# Patient Record
Sex: Female | Born: 1995 | Race: White | Hispanic: No | Marital: Single | Smoking: Never smoker
Health system: Southern US, Community
[De-identification: ages and names within clinical notes are randomized; demographics above are authoritative.]

## PROBLEM LIST (undated history)

## (undated) ENCOUNTER — Inpatient Hospital Stay: Payer: Self-pay

## (undated) DIAGNOSIS — F411 Generalized anxiety disorder: Secondary | ICD-10-CM

## (undated) DIAGNOSIS — I10 Essential (primary) hypertension: Secondary | ICD-10-CM

## (undated) DIAGNOSIS — E119 Type 2 diabetes mellitus without complications: Secondary | ICD-10-CM

## (undated) DIAGNOSIS — E1110 Type 2 diabetes mellitus with ketoacidosis without coma: Secondary | ICD-10-CM

## (undated) DIAGNOSIS — R011 Cardiac murmur, unspecified: Secondary | ICD-10-CM

## (undated) HISTORY — DX: Essential (primary) hypertension: I10

## (undated) HISTORY — DX: Generalized anxiety disorder: F41.1

## (undated) HISTORY — DX: Type 2 diabetes mellitus with ketoacidosis without coma: E11.10

## (undated) HISTORY — DX: Cardiac murmur, unspecified: R01.1

## (undated) HISTORY — PX: NO PAST SURGERIES: SHX2092

---

## 2013-08-25 ENCOUNTER — Inpatient Hospital Stay: Payer: Self-pay | Admitting: Internal Medicine

## 2013-08-25 LAB — BASIC METABOLIC PANEL
Anion Gap: 11 (ref 7–16)
BUN: 7 mg/dL (ref 7–18)
Calcium, Total: 7.7 mg/dL — ABNORMAL LOW (ref 9.0–10.7)
Chloride: 100 mmol/L (ref 98–107)
Chloride: 115 mmol/L — ABNORMAL HIGH (ref 98–107)
Co2: 8 mmol/L — CL (ref 21–32)
Co2: 14 mmol/L — ABNORMAL LOW (ref 21–32)
Creatinine: 0.69 mg/dL (ref 0.60–1.30)
EGFR (Non-African Amer.): >60
Glucose: 556 mg/dL (ref 65–99)
Glucose: 163 mg/dL — ABNORMAL HIGH (ref 65–99)
Osmolality: 283 (ref 275–301)
Osmolality: 281 (ref 275–301)
Potassium: 3 mmol/L — ABNORMAL LOW (ref 3.5–5.1)
Sodium: 128 mmol/L — ABNORMAL LOW (ref 136–145)
Sodium: 140 mmol/L (ref 136–145)

## 2013-08-25 LAB — URINALYSIS, COMPLETE
Glucose,UR: >=500 mg/dL (ref 0–75)
Leukocyte Esterase: NEGATIVE
Nitrite: NEGATIVE
Specific Gravity: 1.024 (ref 1.003–1.030)
Squamous Epithelial: 1
WBC UR: 1 /HPF (ref 0–5)

## 2013-08-25 LAB — CBC
HCT: 46.4 % (ref 35.0–47.0)
MCHC: 33.8 g/dL (ref 32.0–36.0)
MCV: 96 fL (ref 80–100)
RBC: 4.85 10*6/uL (ref 3.80–5.20)
RDW: 13.3 % (ref 11.5–14.5)
WBC: 13.7 10*3/uL — ABNORMAL HIGH (ref 3.6–11.0)

## 2013-08-26 LAB — BASIC METABOLIC PANEL
Anion Gap: 9 (ref 7–16)
BUN: 7 mg/dL (ref 7–18)
BUN: 6 mg/dL — ABNORMAL LOW (ref 7–18)
Calcium, Total: 7.8 mg/dL — ABNORMAL LOW (ref 9.0–10.7)
Chloride: 112 mmol/L — ABNORMAL HIGH (ref 98–107)
Co2: 14 mmol/L — ABNORMAL LOW (ref 21–32)
Creatinine: 0.56 mg/dL — ABNORMAL LOW (ref 0.60–1.30)
EGFR (African American): >60
Glucose: 116 mg/dL — ABNORMAL HIGH (ref 65–99)
Glucose: 375 mg/dL — ABNORMAL HIGH (ref 65–99)
Osmolality: 289 (ref 275–301)
Potassium: 2.8 mmol/L — ABNORMAL LOW (ref 3.5–5.1)

## 2013-08-26 LAB — CBC WITH DIFFERENTIAL/PLATELET
Basophil #: 0.1 10*3/uL (ref 0.0–0.1)
Basophil %: 0.8 %
Eosinophil %: 0.6 %
HCT: 37.4 % (ref 35.0–47.0)
Lymphocyte #: 2.8 10*3/uL (ref 1.0–3.6)
Lymphocyte %: 37.3 %
MCH: 32.9 pg (ref 26.0–34.0)
MCHC: 35.5 g/dL (ref 32.0–36.0)
MCV: 93 fL (ref 80–100)
Monocyte %: 8.2 %
Neutrophil #: 4 10*3/uL (ref 1.4–6.5)
RBC: 4.03 10*6/uL (ref 3.80–5.20)
RDW: 13.3 % (ref 11.5–14.5)
WBC: 7.5 10*3/uL (ref 3.6–11.0)

## 2013-08-26 LAB — MAGNESIUM: Magnesium: 1.2 mg/dL — ABNORMAL LOW

## 2013-08-26 LAB — LIPID PANEL: Triglycerides: 600 mg/dL — ABNORMAL HIGH (ref 0–135)

## 2014-03-18 ENCOUNTER — Emergency Department: Payer: Self-pay

## 2014-03-18 LAB — CBC
HCT: 45.7 % (ref 35.0–47.0)
HGB: 15.4 g/dL (ref 12.0–16.0)
MCH: 31 pg (ref 26.0–34.0)
MCHC: 33.8 g/dL (ref 32.0–36.0)
MCV: 92 fL (ref 80–100)
Platelet: 163 10*3/uL (ref 150–440)
RBC: 4.98 10*6/uL (ref 3.80–5.20)
RDW: 12.9 % (ref 11.5–14.5)
WBC: 7 10*3/uL (ref 3.6–11.0)

## 2014-03-18 LAB — URINALYSIS, COMPLETE
BACTERIA: NONE SEEN
BILIRUBIN, UR: NEGATIVE
BLOOD: NEGATIVE
NITRITE: NEGATIVE
Ph: 6 (ref 4.5–8.0)
RBC,UR: 1 /HPF (ref 0–5)
SPECIFIC GRAVITY: 1.019 (ref 1.003–1.030)
Squamous Epithelial: 9
WBC UR: 17 /HPF (ref 0–5)

## 2014-03-18 LAB — BASIC METABOLIC PANEL
Anion Gap: 9 (ref 7–16)
BUN: 7 mg/dL (ref 7–18)
CALCIUM: 8.7 mg/dL — AB (ref 9.0–10.7)
Chloride: 101 mmol/L (ref 98–107)
Co2: 24 mmol/L (ref 21–32)
Creatinine: 0.87 mg/dL (ref 0.60–1.30)
EGFR (African American): >60
EGFR (Non-African Amer.): >60
Glucose: 181 mg/dL — ABNORMAL HIGH (ref 65–99)
Osmolality: 271 (ref 275–301)
Potassium: 3.9 mmol/L (ref 3.5–5.1)
Sodium: 134 mmol/L — ABNORMAL LOW (ref 136–145)

## 2014-03-18 LAB — PREGNANCY, URINE: PREGNANCY TEST, URINE: NEGATIVE m[IU]/mL

## 2014-03-20 LAB — URINE CULTURE

## 2014-03-23 LAB — CULTURE, BLOOD (SINGLE)

## 2014-11-24 IMAGING — CR DG CHEST 1V
1 series · 2 of 2 positions shown · non-contrast
Comparison: none

REASON FOR EXAM: chest pain
COMMENTS:

PROCEDURE:     DXR - DXR CHEST 1 VIEWAP OR PA  - August 25, 2013 [DATE]
RESULT:     The lungs are clear. The cardiac silhouette and visualized bony
skeleton are unremarkable.

[Series 1: ap · 0.17mm/px · 2 of 2 slices shown]
[im 1/2]
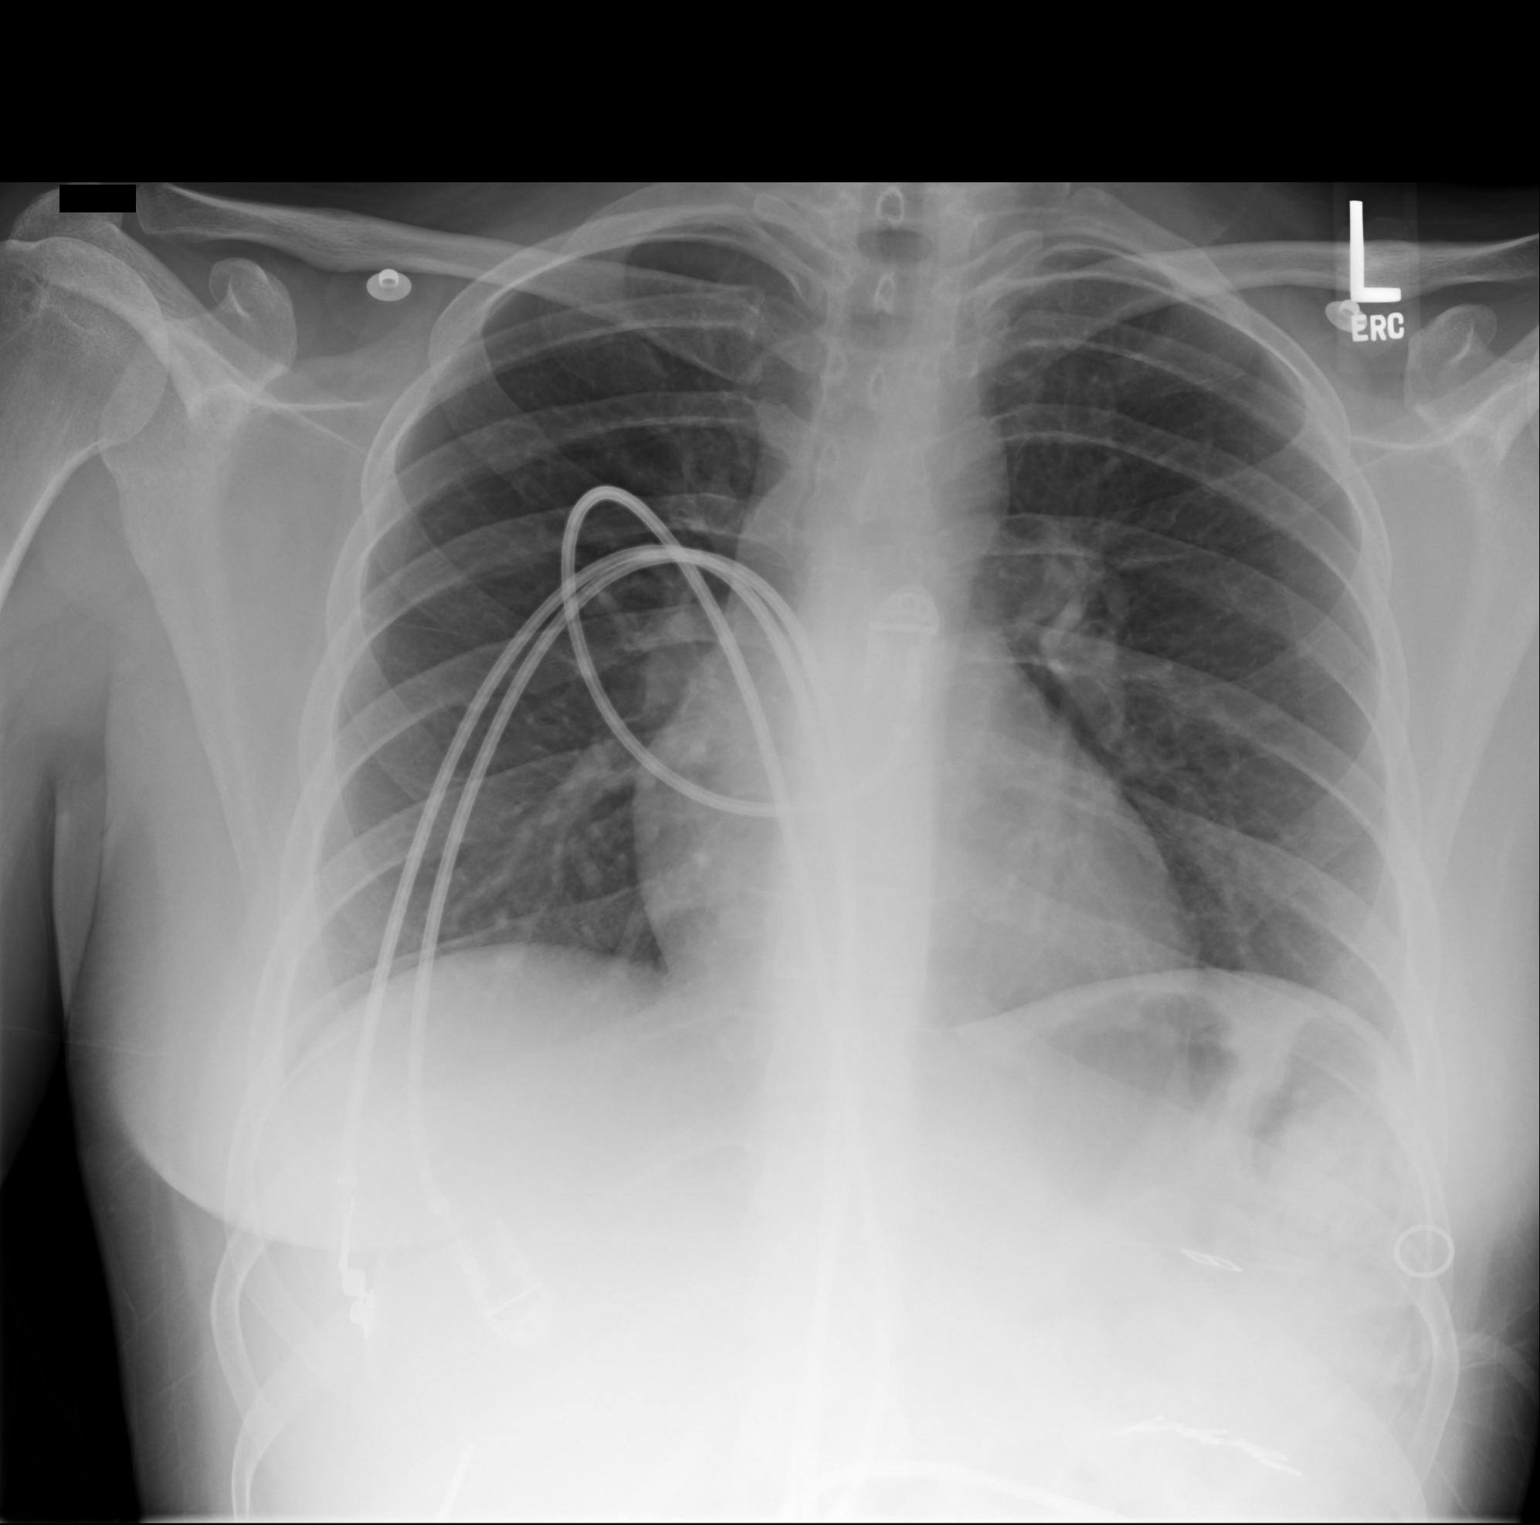
[im 2/2]
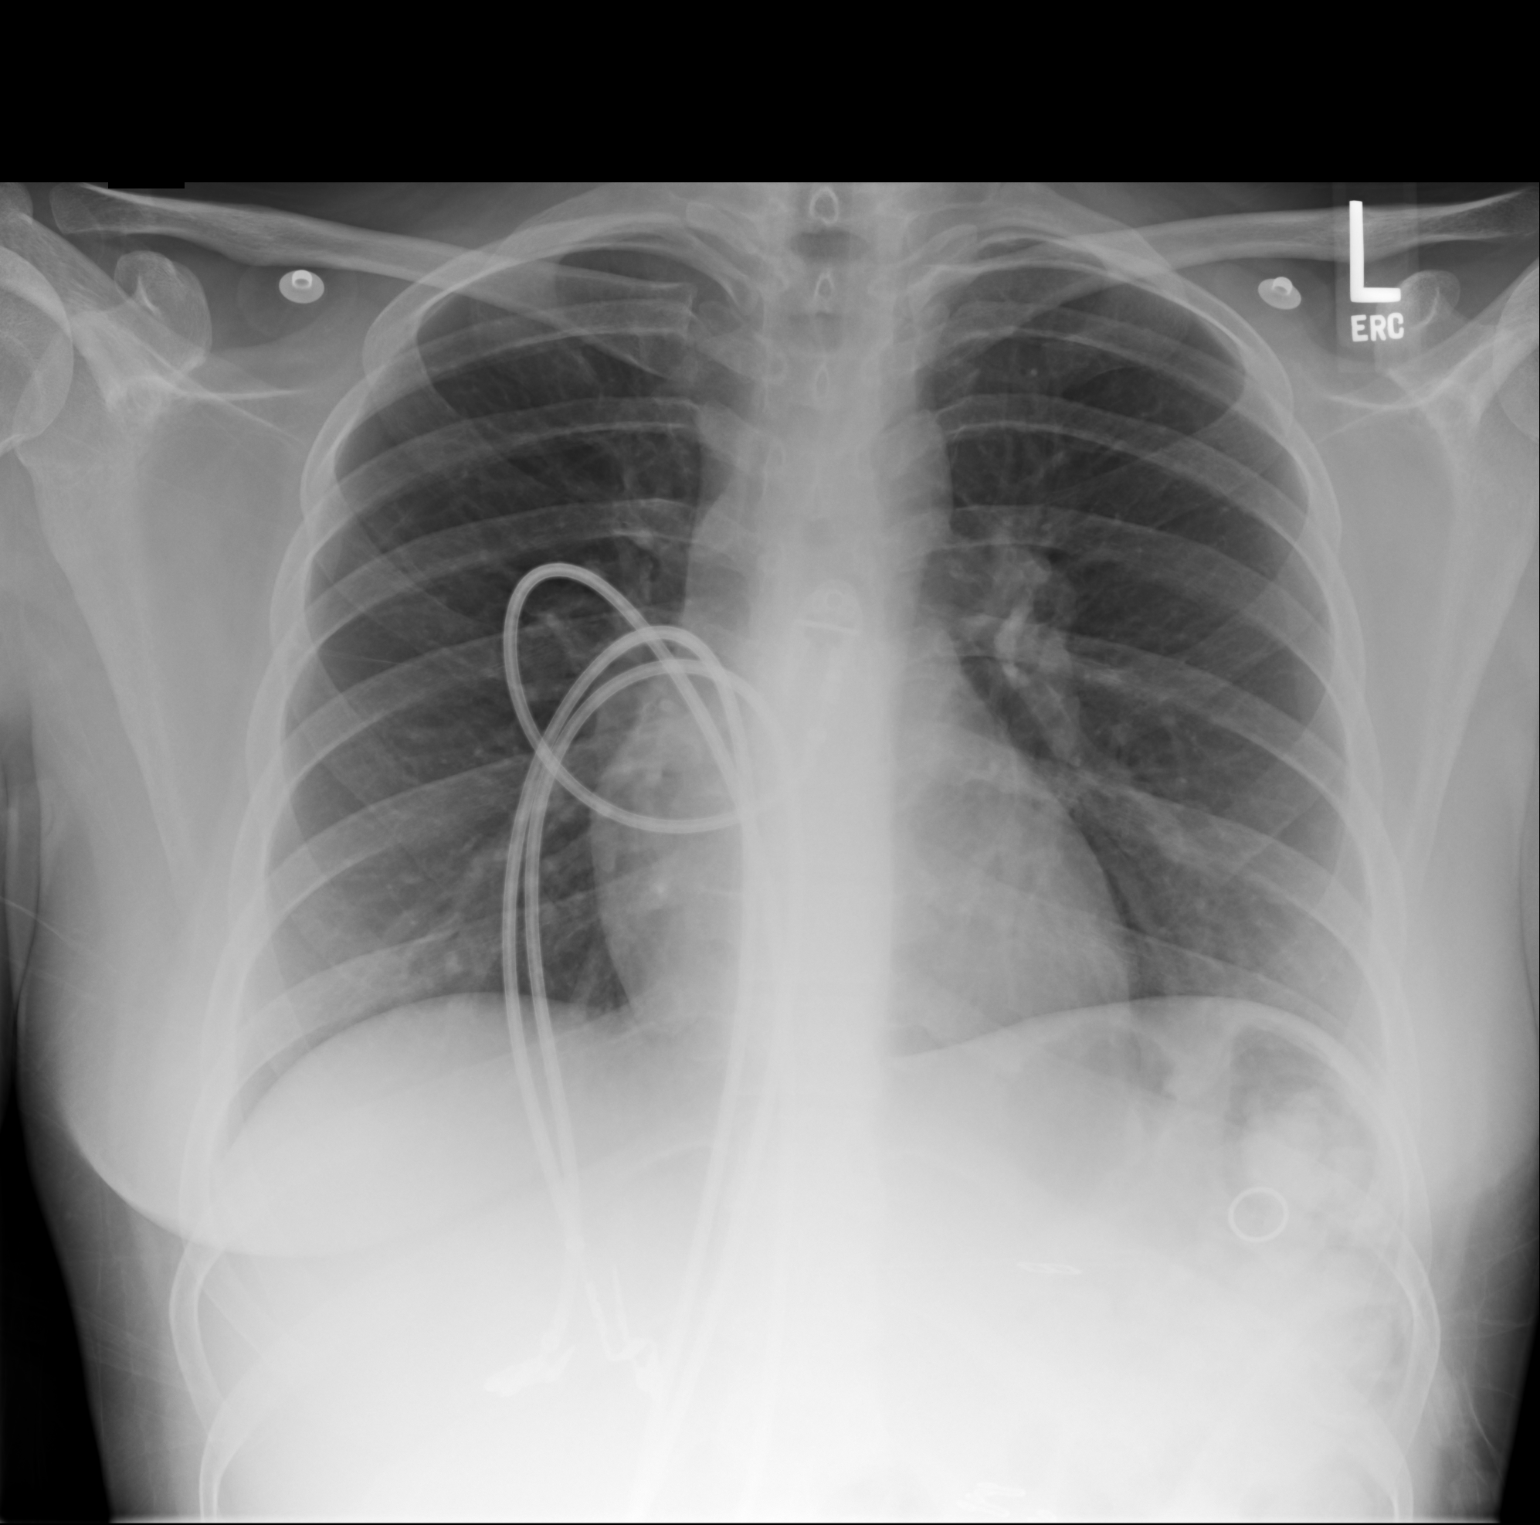

[2 of 2 positions shown; findings below may reference images not displayed]

IMPRESSION: 1. Chest radiograph without evidence of acute cardiopulmonary disease.

## 2015-02-19 NOTE — H&P (Signed)
PATIENT NAMHardin [name]:  [name], [name] MR#:  [id number] DATE OF BIRTH:  04-Oct-1996  DATE OF ADMISSION:  08/25/2013  PRIMARY CARE PHYSICIAN:  None.  REFERRING PHYSICIAN: Maurilio LovelyNoelle McLaurin, MD  CHIEF COMPLAINT: Chest pain on and off for the past 3 months and worsening today.   HISTORY OF PRESENT ILLNESS: A 19 year old Caucasian female with no past medical history presented in the ED with chest pain for the past 3 months which is in the substernal area, burning sensation, intermittent and worsening today. The patient feels sick with nausea, vomiting but she denies any abdominal pain. No diarrhea. No melena or bloody stool. The patient  admitted that she has polyuria, polydipsia and weight loss recently. The patient was noted to have a high blood sugar, more than 500. ABG showed pH 6.98, bicarb 23. The patient is diagnosed with DKA and started insulin drip. The patient denies any fever or chills. No headache or dizziness. No cough, sputum or shortness of breath.  No recent infection.   PAST MEDICAL HISTORY: No.   PAST SURGICAL HISTORY: No.   ALLERGIES: No.   MEDICATION:   No.  FAMILY HISTORY: Father has diabetes and hypertension.   REVIEW OF SYSTEMS: CONSTITUTIONAL: The patient denies any fever or chills. Has headache but no dizziness, but has weakness. Has had weight loss.  EYES: Has double vision, blurry vision. EARS, NOSE, THROAT: No postnasal drip, slurred speech or dysphagia.  CARDIOVASCULAR: Positive for chest pain. No palpitations. No orthopnea or nocturnal dyspnea. No leg edema.  PULMONARY: No cough, sputum, shortness of breath. No hemoptysis.  GASTROINTESTINAL: The patient has some epigastric abdominal pain on and off but not today. The patient has nausea and vomiting once. Denies any diarrhea, melena or bloody stool.  GENITOURINARY: No dysuria, hematuria or incontinence.  SKIN: No rash or jaundice.  NEUROLOGIC: No syncope, loss of consciousness or seizure.  ENDOCRINE: Positive for polyuria,  polydipsia.  No heat or cold intolerance.  SKIN: No rash or jaundice.  HEMATOLOGIC: No easy bruising, bleeding.   PHYSICAL EXAMINATION: VITAL SIGNS: Temperature 98.9, blood pressure 147/70, pulse 116, O2 saturation 99% on room air.  GENERAL: The patient is alert, awake, oriented, in no acute distress.  HEENT: Pupils round, equal and reactive to light and accommodation. Dry oral mucosa. Clear oropharynx. No discharge from ear or nose.   NECK: Supple. No JVD or carotid bruit. No lymphadenopathy. No thyromegaly.  CARDIOVASCULAR: S1, S2. Regular rate, rhythm. No murmurs or gallops.  PULMONARY: Bilateral air entry. No wheezing or rales. No use of accessory muscle to breathe.  ABDOMEN: Soft. No distention or tenderness. No organomegaly. Bowel sounds present.  EXTREMITIES: No edema, clubbing or cyanosis. No calf tenderness. Strong bilateral pedal pulses.  SKIN: No rash or jaundice.  NEUROLOGIC: A and O x 3. No focal deficit. Power 5 out of 5. Sensation intact.   LABORATORY DATA: Venous ABG showed pH of 7.98, pCO2 23. Glucose of 556, BUN 13, creatinine 0.77, sodium 128, potassium 3.9, chloride 100, bicarb 8, anion gap 20. WBC 13.7, hemoglobin 15.7, platelets 290.   IMPRESSION: 1.  Diabetic ketoacidosis.  2.  Uncontrolled diabetes, new diagnosis.  3.  Leukocytosis, possibly due to reaction.  4.  Hyponatremia, possibly due to hyperkalemia.  5.  Possible gastroesophageal reflux disease.   6.  Chest pain, possibly due to gastroesophageal reflux disease.   7.  Tachycardia, possibly due to diabetic ketoacidosis.    PLAN OF TREATMENT: 1.  The patient will be admitted to Critical Care Unit. The  patient is now is on insulin drip. We will continue insulin drip, titrate depending on the patient's blood sugar. We will give aggressive normal saline IV fluid support and get endocrinology consult from Dr. Tedd [name].  2.  We will follow up CBC, BMP and hemoglobin A1c.   3.  For possible GERD, we will give Protonix  and check a chest x-ray.   I discussed the patient's critical condition with the patient and the patient's father.   TIME SPENT: About 65 minutes.    ____________________________ Selena Pollack, MD qc:cs D: 08/25/2013 19:35:38 ET T: 08/25/2013 20:03:36 ET JOB#: [id number]  cc: Selena Pollack, MD, <Dictator> Selena Pollack MD ELECTRONICALLY SIGNED 08/27/2013 18:20

## 2015-02-19 NOTE — Consult Note (Signed)
PATIENT NAME:  [name], [name] MR#:  [id number] DATE OF BIRTH:  Apr 22, 1996  DATE OF CONSULTATION:  08/26/2013  REFERRING PHYSICIAN:  Shaune Pollack, MD CONSULTING PHYSICIAN:  A. Wendall Mola, MD  CHIEF COMPLAINT: DKA and new onset diabetes.   HISTORY OF PRESENT ILLNESS: This is a 19 year old female seen in consultation for diabetic ketoacidosis. She presented yesterday to the Emergency Department with nausea, vomiting, polyuria, polydipsia and weight loss. She estimates she had lost about 25 pounds in the last 3 to 4 weeks. She was found to have an initial blood sugar of 556, bicarb 8, anion gap 20  and 2+ urinary ketones consistent with ketoacidosis. She has no prior medical history. She denies recent infections or use of medications. She denies exposure to glucocorticoids. She was admitted and placed on IV fluids and IV insulin. Her blood sugars improved. She was able to be taken off the IV insulin once her anion gap closed. She was then put on once daily Levemir and a NovoLog insulin sliding scale. Sugars over the last couple of hours have been in the range of 104 to 359. She was not given any prandial insulin at breakfast or sliding scale, as her premeal sugar was just 120. She states she ate her full tray, including some orange juice on the tray.   PAST MEDICAL HISTORY: None.   OUTPATIENT MEDICATIONS: None.   SOCIAL HISTORY: The patient is employed. She denies use of tobacco or alcohol.   FAMILY HISTORY: Father has type 2 diabetes, onset age 53 and is managed on oral diabetes medications. Mother had a history of gestational diabetes. No known thyroid disease or autoimmune conditions.   ALLERGIES: No known drug allergies.   REVIEW OF SYSTEMS:  GENERAL: Weight loss, as per HPI. She has fatigue.  HEENT: She had blurred vision yesterday, now resolved. Denies sore throat.  NECK: Denies neck pain or dysphagia.  CARDIAC: Denies chest pain or palpitation.  PULMONARY: Denies cough or shortness of  breath.  ABDOMEN: Appetite is good. Nausea and vomiting have resolved.  EXTREMITIES: Denies leg swelling.  SKIN: Denies rash or recent skin changes.  ENDOCRINE:  Denies heat or cold intolerance.  GENITOURINARY: Denies dysuria. Denies hematuria.  HEMATOLOGIC: Denies easy bruisability or recent bleeding.   PHYSICAL EXAMINATION: VITAL SIGNS: Height 66.9 inches, weight 144 pounds, BMI 22. Temp 98.4, pulse 104, respirations 20, blood pressure 133/63, pulse oximetry 99% on room air.  GENERAL: Well-developed, well-nourished young female in no acute distress.  HEENT: EOMI.  Oropharynx is clear. Mucous membranes moist. She has braces on her teeth.  NECK: Supple. No thyromegaly. No neck tenderness.  CARDIAC: Regular rate and rhythm. No murmur.  PULMONARY: Clear to auscultation bilaterally. No wheeze. No rhonchi.  ABDOMEN: Diffusely soft, nontender, nondistended.  EXTREMITIES: No edema is present. Full range of motion in all extremities.  NEUROLOGIC: No focal deficits. No tremor.  SKIN: No rash or dermatopathy is present.  PSYCHIATRIC: Alert and oriented. Calm and cooperative.   LABORATORY DATA: Glucose 116, BUN 7, creatinine 0.55, sodium 141, potassium 2.8, chloride 116, total cholesterol 281, triglycerides 600, magnesium 1.2, HDL 33, TSH 2.50, hematocrit 37.4%, WBC 7.5, hemoglobin 13.3, platelets 227.   ASSESSMENT: A 19 year old female presenting with:  1.  Diabetic ketoacidosis in setting of new onset diabetes, most likely type 1 diabetes.  2.  Hypertriglyceridemia, moderate to severe.  3.  Hypokalemia.   RECOMMENDATIONS: 1.  Agree with transition to subcutaneous insulin. Would increase Levemir to 14 units, this can be given  at bedtime.  2.  Initiate prandial insulin with NovoLog 4 units t.i.d. a.c.  3.  Change sliding scale to 1 unit per 50 over a target of 150.  4.  I recommend a no concentrated sweet diet, in particular, she should not be receiving foods such as juice or ice cream.  5.   She will need diabetes teaching by the diabetic educators, as well as the nutritionist. She will  need to be given a glucometer and taught how to monitor her blood sugars. She will need to be taught on self-administration of insulin.  6.  Recommend she stay in the hospital today. If doing well, then she may be ready for discharge as soon as tomorrow.    7.  I will ensure we get an outpatient followup in the next 1 to 2 weeks.   Thank you for the kind request for consultation. I will follow along with you.    ____________________________ A. Wendall MolaMelissa Lisandro Meggett, MD ams:dmm D: 08/26/2013 13:09:34 ET T: 08/26/2013 13:26:11 ET JOB#: [id number]  cc: A. Wendall MolaMelissa Bohdi Leeds, MD, <Dictator> Macy MisA. MELISSA Arthella Headings MD ELECTRONICALLY SIGNED 09/10/2013 14:02

## 2015-02-19 NOTE — Discharge Summary (Signed)
PATIENT NAME:  [name], [name] DATE OF BIRTH:  1996-01-29  DATE OF ADMISSION:  08/25/2013 DATE OF DISCHARGE:  08/27/2013  PRIMARY CARE PHYSICIAN:  Nonlocal.    DISCHARGE DIAGNOSES:  1.  Acute diabetic ketoacidosis, newly diagnosed diabetes 1.   2.  Hyperkalemia.   3.  Hyperglycemia.    CONDITION: Stable.   CODE STATUS: Full code.   HOME MEDICATIONS: Old medication none. New medication for this patient:  1.  NovoLog insulin aspart 5 units subcutaneous t.i.d. before meals.  2.  Pravastatin 20 mg p.o. at bedtime.  3.  Levemir 16 units subcu at bedtime.   DIET: Low fat, low cholesterol, ADA diet.   ACTIVITY: As tolerated.   FOLLOWUP CARE: Follow with PCP within 1 to 2 weeks. Follow up with Dr. Tedd SiasSolum within 1 to 2 weeks.   CONSULTATION:  Dr. Tedd SiasSolum, endocrinologist.   REASON FOR ADMISSION: Chest pain on and off for the past 3 months and worsening.   HOSPITAL COURSE: The patient is a 19 year old Caucasian female with no past medical history who presented to the ED with chest pain on and off for the past 3 months which is in the substernal area, burning sensation, intermittent and worsening on the admission date. The patient also has nausea, vomiting. The patient was noted to have a high blood sugar, more than 500. ABG showed pH of 6.98, bicarb 23. The patient was diagnosed DKA and started on insulin drip. In addition, the patient has had polyuria, polydipsia, weight loss recently. For detailed history and physical examination, please refer to the admission note dictated by me. Laboratory data on admission date showed ABG as mentioned above. Glucose 556, BUN 13, creatinine 0.77, sodium 128, potassium 3.9, chloride 100, bicarb 8, anion gap 20. WBC 13.7, hemoglobin 15.7.  1.  DKA with new diagnosis of diabetes 1. The patient was initially admitted to CCU with insulin drip and the patient's sugar has been controlled on the insulin drip so the patient was switched to Levemir 16 units  at bedtime with NovoLog 5 units subcu t.i.d. before meals according to Dr. Pricilla HandlerSolum's suggestion. In addition, the patient has been treated with aggressive IV fluid support. The patient's leukocytosis has improved.  2.  The patient also had hypokalemia which was treated with potassium supplement, has improved.  3.  Hypomagnesemia. The patient was treated with magnesium supplement.  4.  The patient triglyceride is high at 600. Cholesterol level is 281. The patient has been treated with pravastatin 20 mg at bedtime.   The patient's symptoms have much improved today. The patient's blood sugar was 128 early in the morning and 306 before lunch. I discussed with Dr. Tedd SiasSolum. She suggested continue Levemir 16 units with 5 units NovoLog before meals. The patient received diabetes education and diet control education. The patient is clinically stable and will be discharged to home today. I discussed the patient's discharge plan with the patient, the patient's mother and father, case manager and Dr. Tedd SiasSolum.   TIME SPENT: About 40 minutes.   ____________________________ Shaune PollackQing Kiyra Slaubaugh, MD qc:cs D: 08/27/2013 17:20:35 ET T: 08/27/2013 19:39:19 ET JOB#: [id number]  cc: Shaune PollackQing Ilisha Blust, MD, <Dictator> Shaune PollackQING Ramesh Moan MD ELECTRONICALLY SIGNED 08/31/2013 12:23

## 2015-06-16 ENCOUNTER — Encounter: Payer: Self-pay | Admitting: Emergency Medicine

## 2015-06-16 ENCOUNTER — Emergency Department
Admission: EM | Admit: 2015-06-16 | Discharge: 2015-06-17 | Disposition: A | Discharge status: Home / Self Care | Payer: BC Managed Care – PPO | Attending: Emergency Medicine | Admitting: Emergency Medicine

## 2015-06-16 DIAGNOSIS — E119 Type 2 diabetes mellitus without complications: Secondary | ICD-10-CM | POA: Insufficient documentation

## 2015-06-16 DIAGNOSIS — N390 Urinary tract infection, site not specified: Secondary | ICD-10-CM

## 2015-06-16 DIAGNOSIS — Z3202 Encounter for pregnancy test, result negative: Secondary | ICD-10-CM | POA: Insufficient documentation

## 2015-06-16 DIAGNOSIS — R350 Frequency of micturition: Secondary | ICD-10-CM | POA: Diagnosis present

## 2015-06-16 HISTORY — DX: Type 2 diabetes mellitus without complications: E11.9

## 2015-06-16 LAB — POCT PREGNANCY, URINE: PREG TEST UR: NEGATIVE

## 2015-06-16 LAB — URINALYSIS COMPLETE WITH MICROSCOPIC (ARMC ONLY)
Bacteria, UA: NONE SEEN
Bilirubin Urine: NEGATIVE
Ketones, ur: NEGATIVE mg/dL
NITRITE: POSITIVE — AB
Protein, ur: 100 mg/dL — AB
SPECIFIC GRAVITY, URINE: 1.016 (ref 1.005–1.030)
pH: 6 (ref 5.0–8.0)

## 2015-06-16 MED ORDER — HYDROCODONE-ACETAMINOPHEN 5-325 MG PO TABS
1.0000 | ORAL_TABLET | Freq: Once | ORAL | Status: AC
  Administered 2015-06-16: 1 via ORAL

## 2015-06-16 MED ORDER — DEXTROSE 5 % IV SOLN
1.0000 g | Freq: Once | INTRAVENOUS | Status: AC
  Administered 2015-06-16: 1 g via INTRAVENOUS
  Filled 2015-06-16: qty 10

## 2015-06-16 MED ORDER — HYDROCODONE-ACETAMINOPHEN 5-325 MG PO TABS: 1.0000 | ORAL_TABLET | ORAL | Status: DC | PRN

## 2015-06-16 MED ORDER — CEPHALEXIN 500 MG PO CAPS: 500.0000 mg | ORAL_CAPSULE | Freq: Three times a day (TID) | ORAL | Status: AC

## 2015-06-16 MED ORDER — HYDROCODONE-ACETAMINOPHEN 5-325 MG PO TABS
ORAL_TABLET | ORAL | Status: AC
  Administered 2015-06-16: 1 via ORAL
  Filled 2015-06-16: qty 1

## 2015-06-16 NOTE — ED Notes (Signed)
C/o burning with urination.  States seen at [facility] Monday and started on amoxicillin for uti, states sx are not improving.

## 2015-06-16 NOTE — Discharge Instructions (Signed)
Please seek medical attention for any high fevers, chest pain, shortness of breath, change in behavior, persistent vomiting, bloody stool or any other new or concerning symptoms. ° °Urinary Tract Infection °Urinary tract infections (UTIs) can develop anywhere along your urinary tract. Your urinary tract is your body's drainage system for removing wastes and extra water. Your urinary tract includes two kidneys, two ureters, a bladder, and a urethra. Your kidneys are a pair of bean-shaped organs. Each kidney is about the size of your fist. They are located below your ribs, one on each side of your spine. °CAUSES °Infections are caused by microbes, which are microscopic organisms, including fungi, viruses, and bacteria. These organisms are so small that they can only be seen through a microscope. Bacteria are the microbes that most commonly cause UTIs. °SYMPTOMS  °Symptoms of UTIs may vary by age and gender of the patient and by the location of the infection. Symptoms in young women typically include a frequent and intense urge to urinate and a painful, burning feeling in the bladder or urethra during urination. Older women and men are more likely to be tired, shaky, and weak and have muscle aches and abdominal pain. A fever may mean the infection is in your kidneys. Other symptoms of a kidney infection include pain in your back or sides below the ribs, nausea, and vomiting. °DIAGNOSIS °To diagnose a UTI, your caregiver will ask you about your symptoms. Your caregiver also will ask to provide a urine sample. The urine sample will be tested for bacteria and white blood cells. White blood cells are made by your body to help fight infection. °TREATMENT  °Typically, UTIs can be treated with medication. Because most UTIs are caused by a bacterial infection, they usually can be treated with the use of antibiotics. The choice of antibiotic and length of treatment depend on your symptoms and the type of bacteria causing your  infection. °HOME CARE INSTRUCTIONS °· If you were prescribed antibiotics, take them exactly as your caregiver instructs you. Finish the medication even if you feel better after you have only taken some of the medication. °· Drink enough water and fluids to keep your urine clear or pale yellow. °· Avoid caffeine, tea, and carbonated beverages. They tend to irritate your bladder. °· Empty your bladder often. Avoid holding urine for long periods of time. °· Empty your bladder before and after sexual intercourse. °· After a bowel movement, women should cleanse from front to back. Use each tissue only once. °SEEK MEDICAL CARE IF:  °· You have back pain. °· You develop a fever. °· Your symptoms do not begin to resolve within 3 days. °SEEK IMMEDIATE MEDICAL CARE IF:  °· You have severe back pain or lower abdominal pain. °· You develop chills. °· You have nausea or vomiting. °· You have continued burning or discomfort with urination. °MAKE SURE YOU:  °· Understand these instructions. °· Will watch your condition. °· Will get help right away if you are not doing well or get worse. °Document Released: 07/26/2005 Document Revised: 04/16/2012 Document Reviewed: 11/24/2011 °Exit[employer]® Patient Information ©2015 [employer]. This information is not intended to replace advice given to you by your health care provider. Make sure you discuss any questions you have with your health care provider. ° °

## 2015-06-16 NOTE — ED Provider Notes (Signed)
[facility] Emergency Department Provider Note    ____________________________________________  Time seen: 2205  I have reviewed the triage vital signs and the nursing notes.   HISTORY  Chief Complaint Urinary Frequency   History limited by: Not Limited   HPI Laasia L Lodico is a 19 y.o. female who presents to the emergency department today with continued concerns for continued urinary tract infection. Patient states that she has been having urinary tract infection symptoms for the past 3 days. This included suprapubic pain and some dysuria. She states that the symptoms have been persistent since they started. She did go see her doctor 3 days ago and was started on amoxicillin. She states that she has had some low-grade fevers. States she does have a history of urinary tract infections.    Past Medical History  Diagnosis Date  . Diabetes mellitus without complication     There are no active problems to display for this patient.   History reviewed. No pertinent past surgical history.  No current outpatient prescriptions on file.  Allergies Review of patient's allergies indicates no known allergies.  History reviewed. No pertinent family history.  Social History Social History  Substance Use Topics  . Smoking status: Never Smoker   . Smokeless tobacco: None  . Alcohol Use: Yes    Review of Systems  Constitutional: Negative for fever. Cardiovascular: Negative for chest pain. Respiratory: Negative for shortness of breath. Gastrointestinal: Positive for suprapubic pain Genitourinary: Negative for dysuria. Musculoskeletal: Negative for back pain. Skin: Negative for rash. Neurological: Negative for headaches, focal weakness or numbness.  10-point ROS otherwise negative.  ____________________________________________   PHYSICAL EXAM:  VITAL SIGNS: ED Triage Vitals  Enc Vitals Group     BP 06/16/15 1819 152/81 mmHg     Pulse Rate  06/16/15 1819 96     Resp 06/16/15 1819 18     Temp 06/16/15 1819 98.3 F (36.8 C)     Temp Source 06/16/15 1819 Oral     SpO2 06/16/15 1819 99 %     Weight 06/16/15 1819 165 lb (74.844 kg)     Height 06/16/15 1819  (1.676 m)     Head Cir --      Peak Flow --      Pain Score 06/16/15 1820 5   Constitutional: Alert and oriented. Well appearing and in no distress. Eyes: Conjunctivae are normal. PERRL. Normal extraocular movements. ENT   Head: Normocephalic and atraumatic.   Nose: No congestion/rhinnorhea.   Mouth/Throat: Mucous membranes are moist.   Neck: No stridor. Hematological/Lymphatic/Immunilogical: No cervical lymphadenopathy. Cardiovascular: Normal rate, regular rhythm.  No murmurs, rubs, or gallops. Respiratory: Normal respiratory effort without tachypnea nor retractions. Breath sounds are clear and equal bilaterally. No wheezes/rales/rhonchi. Gastrointestinal: Soft. Minimally tender to palpation in the suprapubic region. No distention.  Genitourinary: Deferred Musculoskeletal: Normal range of motion in all extremities. No joint effusions.  No lower extremity tenderness nor edema. Neurologic:  Normal speech and language. No gross focal neurologic deficits are appreciated. Speech is normal.  Skin:  Skin is warm, dry and intact. No rash noted. Psychiatric: Mood and affect are normal. Speech and behavior are normal. Patient exhibits appropriate insight and judgment.  ____________________________________________    LABS (pertinent positives/negatives)  Labs Reviewed  URINALYSIS COMPLETEWITH MICROSCOPIC ([facility] ONLY) - Abnormal; Notable for the following:    Color, Urine [place] (*)    APPearance CLOUDY (*)    Glucose, UA >500 (*)    Hgb urine dipstick 2+ (*)  Protein, ur 100 (*)    Nitrite POSITIVE (*)    Leukocytes, UA 2+ (*)    Squamous Epithelial / LPF 0-5 (*)    All other components within normal limits  URINE CULTURE  POC URINE PREG, ED  POCT  PREGNANCY, URINE     ____________________________________________   EKG  None  ____________________________________________    RADIOLOGY  None  ____________________________________________   PROCEDURES  Procedure(s) performed: None  Critical Care performed: No  ____________________________________________   INITIAL IMPRESSION / ASSESSMENT AND PLAN / ED COURSE  Pertinent labs & imaging results that were available during my care of the patient were reviewed by me and considered in my medical decision making (see chart for details).  Patient presents to the emergency department with continued suprapubic pain and concerns for UTI. Urinalysis is consistent with urinary tract infection. Will plan on giving patient does of ceftriaxone here in the emergency department given history of diabetes. No ketones in urine. Will plan on discharging home with ceftriaxone.  ____________________________________________   FINAL CLINICAL IMPRESSION(S) / ED DIAGNOSES  UTI  Phineas Semen, MD 06/16/15 2246

## 2015-06-18 LAB — URINE CULTURE

## 2017-02-25 ENCOUNTER — Encounter: Payer: Self-pay | Admitting: Internal Medicine

## 2017-02-25 ENCOUNTER — Inpatient Hospital Stay
Admission: EM | Admit: 2017-02-25 | Discharge: 2017-02-27 | DRG: 638 | Disposition: A | Discharge status: Home / Self Care | Payer: BLUE CROSS/BLUE SHIELD | Attending: Internal Medicine | Admitting: Internal Medicine

## 2017-02-25 ENCOUNTER — Inpatient Hospital Stay: Payer: BLUE CROSS/BLUE SHIELD

## 2017-02-25 DIAGNOSIS — Z794 Long term (current) use of insulin: Secondary | ICD-10-CM | POA: Diagnosis not present

## 2017-02-25 DIAGNOSIS — N179 Acute kidney failure, unspecified: Secondary | ICD-10-CM | POA: Diagnosis not present

## 2017-02-25 DIAGNOSIS — Z9114 Patient's other noncompliance with medication regimen: Secondary | ICD-10-CM | POA: Diagnosis not present

## 2017-02-25 DIAGNOSIS — R070 Pain in throat: Secondary | ICD-10-CM | POA: Diagnosis present

## 2017-02-25 DIAGNOSIS — Z833 Family history of diabetes mellitus: Secondary | ICD-10-CM | POA: Diagnosis not present

## 2017-02-25 DIAGNOSIS — E860 Dehydration: Secondary | ICD-10-CM | POA: Diagnosis present

## 2017-02-25 DIAGNOSIS — R112 Nausea with vomiting, unspecified: Secondary | ICD-10-CM | POA: Diagnosis present

## 2017-02-25 DIAGNOSIS — E1110 Type 2 diabetes mellitus with ketoacidosis without coma: Secondary | ICD-10-CM

## 2017-02-25 DIAGNOSIS — R918 Other nonspecific abnormal finding of lung field: Secondary | ICD-10-CM

## 2017-02-25 DIAGNOSIS — E1010 Type 1 diabetes mellitus with ketoacidosis without coma: Secondary | ICD-10-CM | POA: Diagnosis not present

## 2017-02-25 DIAGNOSIS — E876 Hypokalemia: Secondary | ICD-10-CM | POA: Diagnosis not present

## 2017-02-25 HISTORY — DX: Type 2 diabetes mellitus with ketoacidosis without coma: E11.10

## 2017-02-25 LAB — BASIC METABOLIC PANEL
ANION GAP: 11 (ref 5–15)
ANION GAP: 6 (ref 5–15)
BUN: 15 mg/dL (ref 6–20)
BUN: 10 mg/dL (ref 6–20)
BUN: 9 mg/dL (ref 6–20)
CHLORIDE: 113 mmol/L — AB (ref 101–111)
CO2: <7 mmol/L — ABNORMAL LOW (ref 22–32)
CO2: 9 mmol/L — ABNORMAL LOW (ref 22–32)
CO2: 14 mmol/L — ABNORMAL LOW (ref 22–32)
Calcium: 6.9 mg/dL — ABNORMAL LOW (ref 8.9–10.3)
Calcium: 7.2 mg/dL — ABNORMAL LOW (ref 8.9–10.3)
Calcium: 7.4 mg/dL — ABNORMAL LOW (ref 8.9–10.3)
Chloride: 109 mmol/L (ref 101–111)
Chloride: 115 mmol/L — ABNORMAL HIGH (ref 101–111)
Creatinine, Ser: 1.14 mg/dL — ABNORMAL HIGH (ref 0.44–1.00)
Creatinine, Ser: 1 mg/dL (ref 0.44–1.00)
Creatinine, Ser: 0.55 mg/dL (ref 0.44–1.00)
GFR calc Af Amer: >60 mL/min (ref >60)
GFR calc Af Amer: >60 mL/min (ref >60)
GFR calc Af Amer: >60 mL/min (ref >60)
GLUCOSE: 333 mg/dL — AB (ref 65–99)
GLUCOSE: 191 mg/dL — AB (ref 65–99)
Glucose, Bld: 112 mg/dL — ABNORMAL HIGH (ref 65–99)
POTASSIUM: 3.9 mmol/L (ref 3.5–5.1)
POTASSIUM: 3.4 mmol/L — AB (ref 3.5–5.1)
Potassium: 5 mmol/L (ref 3.5–5.1)
SODIUM: 133 mmol/L — AB (ref 135–145)
SODIUM: 133 mmol/L — AB (ref 135–145)
SODIUM: 135 mmol/L (ref 135–145)

## 2017-02-25 LAB — COMPREHENSIVE METABOLIC PANEL
ALBUMIN: 5 g/dL (ref 3.5–5.0)
ALT: 24 U/L (ref 14–54)
AST: 38 U/L (ref 15–41)
Alkaline Phosphatase: 112 U/L (ref 38–126)
BUN: 18 mg/dL (ref 6–20)
CHLORIDE: 99 mmol/L — AB (ref 101–111)
CREATININE: 1.71 mg/dL — AB (ref 0.44–1.00)
Calcium: 8.9 mg/dL (ref 8.9–10.3)
GFR calc Af Amer: 48 mL/min — ABNORMAL LOW (ref >60)
GFR, EST NON AFRICAN AMERICAN: 41 mL/min — AB (ref >60)
Glucose, Bld: 655 mg/dL (ref 65–99)
Potassium: 5.8 mmol/L — ABNORMAL HIGH (ref 3.5–5.1)
SODIUM: 130 mmol/L — AB (ref 135–145)
Total Bilirubin: 1.4 mg/dL — ABNORMAL HIGH (ref 0.3–1.2)
Total Protein: 8.7 g/dL — ABNORMAL HIGH (ref 6.5–8.1)

## 2017-02-25 LAB — URINE DRUG SCREEN, QUALITATIVE (ARMC ONLY)
Amphetamines, Ur Screen: NOT DETECTED
BARBITURATES, UR SCREEN: NOT DETECTED
Benzodiazepine, Ur Scrn: NOT DETECTED
CANNABINOID 50 NG, UR ~~LOC~~: NOT DETECTED
Cocaine Metabolite,Ur ~~LOC~~: NOT DETECTED
MDMA (ECSTASY) UR SCREEN: NOT DETECTED
Methadone Scn, Ur: NOT DETECTED
Opiate, Ur Screen: POSITIVE — AB
PHENCYCLIDINE (PCP) UR S: NOT DETECTED
TRICYCLIC, UR SCREEN: NOT DETECTED

## 2017-02-25 LAB — CBC WITH DIFFERENTIAL/PLATELET
Basophils Absolute: 0 10*3/uL (ref 0–0.1)
Basophils Relative: 0 %
EOS ABS: 0 10*3/uL (ref 0–0.7)
Eosinophils Relative: 0 %
HCT: 53.5 % — ABNORMAL HIGH (ref 35.0–47.0)
HEMOGLOBIN: 16.8 g/dL — AB (ref 12.0–16.0)
LYMPHS ABS: 2.4 10*3/uL (ref 1.0–3.6)
LYMPHS PCT: 10 %
MCH: 31.9 pg (ref 26.0–34.0)
MCHC: 31.3 g/dL — AB (ref 32.0–36.0)
MCV: 101.8 fL — AB (ref 80.0–100.0)
MONOS PCT: 4 %
Monocytes Absolute: 0.9 10*3/uL (ref 0.2–0.9)
Neutro Abs: 20.3 10*3/uL — ABNORMAL HIGH (ref 1.4–6.5)
Neutrophils Relative %: 86 %
Platelets: 434 10*3/uL (ref 150–440)
RBC: 5.26 MIL/uL — AB (ref 3.80–5.20)
RDW: 14.6 % — ABNORMAL HIGH (ref 11.5–14.5)
WBC: 23.7 10*3/uL — AB (ref 3.6–11.0)

## 2017-02-25 LAB — PHOSPHORUS
PHOSPHORUS: 1.6 mg/dL — AB (ref 2.5–4.6)
Phosphorus: 1.7 mg/dL — ABNORMAL LOW (ref 2.5–4.6)

## 2017-02-25 LAB — AMYLASE
Amylase: 105 U/L — ABNORMAL HIGH (ref 28–100)
Amylase: 142 U/L — ABNORMAL HIGH (ref 28–100)

## 2017-02-25 LAB — INFLUENZA PANEL BY PCR (TYPE A & B)
INFLAPCR: NEGATIVE
INFLBPCR: NEGATIVE

## 2017-02-25 LAB — GLUCOSE, CAPILLARY
GLUCOSE-CAPILLARY: 101 mg/dL — AB (ref 65–99)
GLUCOSE-CAPILLARY: 147 mg/dL — AB (ref 65–99)
GLUCOSE-CAPILLARY: 183 mg/dL — AB (ref 65–99)
GLUCOSE-CAPILLARY: 183 mg/dL — AB (ref 65–99)
GLUCOSE-CAPILLARY: 197 mg/dL — AB (ref 65–99)
GLUCOSE-CAPILLARY: 234 mg/dL — AB (ref 65–99)
GLUCOSE-CAPILLARY: 263 mg/dL — AB (ref 65–99)
GLUCOSE-CAPILLARY: 426 mg/dL — AB (ref 65–99)
Glucose-Capillary: >600 mg/dL (ref 65–99)
Glucose-Capillary: 523 mg/dL (ref 65–99)
Glucose-Capillary: 296 mg/dL — ABNORMAL HIGH (ref 65–99)
Glucose-Capillary: 190 mg/dL — ABNORMAL HIGH (ref 65–99)
Glucose-Capillary: 195 mg/dL — ABNORMAL HIGH (ref 65–99)
Glucose-Capillary: 142 mg/dL — ABNORMAL HIGH (ref 65–99)
Glucose-Capillary: 109 mg/dL — ABNORMAL HIGH (ref 65–99)
Glucose-Capillary: 97 mg/dL (ref 65–99)

## 2017-02-25 LAB — PROCALCITONIN: Procalcitonin: 0.61 ng/mL

## 2017-02-25 LAB — MRSA PCR SCREENING: MRSA by PCR: NEGATIVE

## 2017-02-25 LAB — MAGNESIUM
Magnesium: 1.5 mg/dL — ABNORMAL LOW (ref 1.7–2.4)
Magnesium: 2.8 mg/dL — ABNORMAL HIGH (ref 1.7–2.4)

## 2017-02-25 LAB — RAPID HIV SCREEN (HIV 1/2 AB+AG)
HIV 1/2 ANTIBODIES: NONREACTIVE
HIV-1 P24 Antigen - HIV24: NONREACTIVE

## 2017-02-25 LAB — LIPASE, BLOOD: LIPASE: 16 U/L (ref 11–51)

## 2017-02-25 LAB — PREGNANCY, URINE: PREG TEST UR: NEGATIVE

## 2017-02-25 MED ORDER — ONDANSETRON HCL 4 MG/2ML IJ SOLN
4.0000 mg | Freq: Four times a day (QID) | INTRAMUSCULAR | Status: DC | PRN
  Administered 2017-02-25: 4 mg via INTRAVENOUS
  Filled 2017-02-25: qty 2

## 2017-02-25 MED ORDER — PNEUMOCOCCAL VAC POLYVALENT 25 MCG/0.5ML IJ INJ: 0.5000 mL | INJECTION | INTRAMUSCULAR | Status: DC

## 2017-02-25 MED ORDER — ONDANSETRON HCL 4 MG PO TABS
4.0000 mg | ORAL_TABLET | Freq: Four times a day (QID) | ORAL | Status: DC | PRN
  Administered 2017-02-26: 4 mg via ORAL
  Filled 2017-02-25: qty 1

## 2017-02-25 MED ORDER — MORPHINE SULFATE (PF) 4 MG/ML IV SOLN
INTRAVENOUS | Status: AC
  Administered 2017-02-25: 4 mg via INTRAVENOUS
  Filled 2017-02-25: qty 1

## 2017-02-25 MED ORDER — ENOXAPARIN SODIUM 40 MG/0.4ML ~~LOC~~ SOLN
30.0000 mg | SUBCUTANEOUS | Status: DC
Start: 2017-02-25 — End: 2017-02-26
  Administered 2017-02-25 – 2017-02-26 (×2): 30 mg via SUBCUTANEOUS
  Filled 2017-02-25 (×2): qty 0.4

## 2017-02-25 MED ORDER — SODIUM CHLORIDE 0.9 % IV BOLUS (SEPSIS)
2000.0000 mL | INTRAVENOUS | Status: DC | PRN
  Administered 2017-02-25: 2000 mL via INTRAVENOUS

## 2017-02-25 MED ORDER — MAGNESIUM SULFATE 2 GM/50ML IV SOLN
2.0000 g | Freq: Once | INTRAVENOUS | Status: AC
  Administered 2017-02-25: 2 g via INTRAVENOUS
  Filled 2017-02-25: qty 50

## 2017-02-25 MED ORDER — SODIUM CHLORIDE 0.9 % IV BOLUS (SEPSIS)
1000.0000 mL | Freq: Once | INTRAVENOUS | Status: AC
  Administered 2017-02-25: 1000 mL via INTRAVENOUS

## 2017-02-25 MED ORDER — POTASSIUM PHOSPHATES 15 MMOLE/5ML IV SOLN
30.0000 mmol | Freq: Once | INTRAVENOUS | Status: AC
  Administered 2017-02-25: 30 mmol via INTRAVENOUS
  Filled 2017-02-25: qty 10

## 2017-02-25 MED ORDER — IBUPROFEN 400 MG PO TABS
400.0000 mg | ORAL_TABLET | Freq: Three times a day (TID) | ORAL | Status: DC | PRN
  Administered 2017-02-25 – 2017-02-26 (×3): 400 mg via ORAL
  Filled 2017-02-25 (×3): qty 1

## 2017-02-25 MED ORDER — MORPHINE SULFATE (PF) 4 MG/ML IV SOLN
4.0000 mg | Freq: Once | INTRAVENOUS | Status: AC
  Administered 2017-02-25: 4 mg via INTRAVENOUS

## 2017-02-25 MED ORDER — POLYETHYLENE GLYCOL 3350 17 G PO PACK: 17.0000 g | PACK | Freq: Every day | ORAL | Status: DC | PRN

## 2017-02-25 MED ORDER — SODIUM CHLORIDE 0.9 % IV SOLN
INTRAVENOUS | Status: DC
  Administered 2017-02-25: 01:00:00 via INTRAVENOUS
  Filled 2017-02-25: qty 2.5

## 2017-02-25 MED ORDER — DEXTROSE-NACL 5-0.45 % IV SOLN
INTRAVENOUS | Status: DC
  Administered 2017-02-25 (×2): via INTRAVENOUS

## 2017-02-25 MED ORDER — SODIUM CHLORIDE 0.9 % IV SOLN
INTRAVENOUS | Status: DC
  Administered 2017-02-25: 4.6 [IU]/h via INTRAVENOUS

## 2017-02-25 MED ORDER — SODIUM BICARBONATE 8.4 % IV SOLN
150.0000 meq | Freq: Once | INTRAVENOUS | Status: AC
  Administered 2017-02-26: 150 meq via INTRAVENOUS
  Filled 2017-02-25: qty 150

## 2017-02-25 MED ORDER — ACETAMINOPHEN 325 MG PO TABS
650.0000 mg | ORAL_TABLET | Freq: Four times a day (QID) | ORAL | Status: DC | PRN
  Administered 2017-02-25 – 2017-02-26 (×2): 650 mg via ORAL
  Filled 2017-02-25 (×2): qty 2

## 2017-02-25 MED ORDER — ONDANSETRON HCL 4 MG/2ML IJ SOLN
4.0000 mg | Freq: Once | INTRAMUSCULAR | Status: AC | PRN
  Administered 2017-02-25: 4 mg via INTRAVENOUS

## 2017-02-25 MED ORDER — SODIUM CHLORIDE 0.9% FLUSH
3.0000 mL | Freq: Two times a day (BID) | INTRAVENOUS | Status: DC
  Administered 2017-02-25 – 2017-02-26 (×4): 3 mL via INTRAVENOUS

## 2017-02-25 MED ORDER — IBUPROFEN 400 MG PO TABS
400.0000 mg | ORAL_TABLET | Freq: Four times a day (QID) | ORAL | Status: DC | PRN
  Administered 2017-02-25: 400 mg via ORAL
  Filled 2017-02-25: qty 1

## 2017-02-25 MED ORDER — ONDANSETRON HCL 4 MG/2ML IJ SOLN
INTRAMUSCULAR | Status: AC
  Administered 2017-02-25: 4 mg via INTRAVENOUS
  Filled 2017-02-25: qty 2

## 2017-02-25 MED ORDER — ACETAMINOPHEN 650 MG RE SUPP: 650.0000 mg | Freq: Four times a day (QID) | RECTAL | Status: DC | PRN

## 2017-02-25 MED ORDER — SODIUM CHLORIDE 0.9 % IV SOLN
INTRAVENOUS | Status: DC
  Administered 2017-02-25: 11:00:00 via INTRAVENOUS

## 2017-02-25 NOTE — H&P (Signed)
[facility] [place] [facility]   PATIENT NAME: [name]    MR#:  [id number]  DATE OF BIRTH:  Nov 07, 1995  DATE OF ADMISSION:  02/25/2017  PRIMARY CARE PHYSICIAN: No PCP Per Patient   REQUESTING/REFERRING PHYSICIAN: Dr. Raynelle Chary  CHIEF COMPLAINT:   Chief Complaint  Patient presents with  . Abdominal Pain  . Weakness  . Emesis    HISTORY OF PRESENT ILLNESS:  Meridith Pearlman  is a 21 y.o. female with a known history of IDDM here with nausea, vomiting for 1 week. Has been taking her insulin but skipped short acting insulin due to vomiting. Here she has been found to have severe DKA, AKI and leucocytosis.  Diagnosed with DM with DKA. No DKA after that.  PAST MEDICAL HISTORY:   Past Medical History:  Diagnosis Date  . Diabetes mellitus without complication (HCC)     PAST SURGICAL HISTORY:  No past surgical history on file.  SOCIAL HISTORY:   Social History  Substance Use Topics  . Smoking status: Never Smoker  . Smokeless tobacco: Not on file  . Alcohol use Yes    FAMILY HISTORY:   Family History  Problem Relation Age of Onset  . Diabetes Father     DRUG ALLERGIES:  No Known Allergies  REVIEW OF SYSTEMS:   Review of Systems  Constitutional: Positive for malaise/fatigue. Negative for chills and fever.  HENT: Negative for sore throat.   Eyes: Negative for blurred vision, double vision and pain.  Respiratory: Negative for cough, hemoptysis, shortness of breath and wheezing.   Cardiovascular: Negative for chest pain, palpitations, orthopnea and leg swelling.  Gastrointestinal: Positive for nausea and vomiting. Negative for abdominal pain, constipation, diarrhea and heartburn.  Genitourinary: Negative for dysuria and hematuria.  Musculoskeletal: Negative for back pain and joint pain.  Skin: Negative for rash.  Neurological: Positive for weakness. Negative for sensory change, speech change, focal weakness and headaches.  Endo/Heme/Allergies:  Does not bruise/bleed easily.  Psychiatric/Behavioral: Negative for depression. The patient is not nervous/anxious.     MEDICATIONS AT HOME:   Prior to Admission medications   Medication Sig Start Date End Date Taking? Authorizing Provider  insulin aspart (NOVOLOG) 100 UNIT/ML injection Inject into the skin 3 (three) times daily before meals. Sliding scale   Yes Historical Provider, MD  insulin glargine (LANTUS) 100 UNIT/ML injection Inject 28 Units into the skin at bedtime.   Yes Historical Provider, MD     VITAL SIGNS:  Blood pressure 136/83, pulse (!) 132, temperature 97.8 F (36.6 C), temperature source Oral, resp. rate 18, height  (1.676 m), weight 78.5 kg (173 lb), last menstrual period 02/23/2017, SpO2 99 %.  PHYSICAL EXAMINATION:  Physical Exam  GENERAL:  21 y.o.-year-old patient lying in the bed. Looks critically ill EYES: Pupils equal, round, reactive to light and accommodation. No scleral icterus. Extraocular muscles intact.  HEENT: Head atraumatic, normocephalic. Oropharynx and nasopharynx clear. No oropharyngeal erythema, moist oral dry NECK:  Supple, no jugular venous distention. No thyroid enlargement, no tenderness.  LUNGS: CTA. Tachypneic CARDIOVASCULAR: S1, S2 normal. No murmurs, rubs, or gallops.  ABDOMEN: Soft, nontender, nondistended. Bowel sounds present. No organomegaly or mass.  EXTREMITIES: No pedal edema, cyanosis, or clubbing. + 2 pedal & radial pulses b/l.   NEUROLOGIC: Cranial nerves II through XII are intact. No focal Motor or sensory deficits appreciated b/l PSYCHIATRIC: The patient is alert and oriented x 3. Good affect.  SKIN: No obvious rash, lesion, or ulcer.  LABORATORY PANEL:   CBC  Recent Labs Lab 02/25/17 0704  WBC 23.7*  HGB 16.8*  HCT 53.5*  PLT 434   ------------------------------------------------------------------------------------------------------------------  Chemistries   Recent Labs Lab 02/25/17 0704  NA 130*  K  5.8*  CL 99*  CO2 <7*  GLUCOSE 655*  BUN 18  CREATININE 1.71*  CALCIUM 8.9  AST 38  ALT 24  ALKPHOS 112  BILITOT 1.4*   ------------------------------------------------------------------------------------------------------------------  Cardiac Enzymes No results for input(s): TROPONINI in the last 168 hours. ------------------------------------------------------------------------------------------------------------------  RADIOLOGY:  No results found.   IMPRESSION AND PLAN:   * Severe DKA IVF bolus stat - Total 4 Liters Then NS and transition to D5-NS once BS <250 BMP Q 4 hrs Insulin drip Consult intensivist Check Hb A1c  * AKI due to dehydration from DKA I/Os Repeat labs  * Leucocytosis Likely from hemoconcentration and stress reaction. Check UA No abx at this time  * Pseudohyponatremia  * DVT prophylaxis Lovenox    All the records are reviewed and case discussed with ED provider. Management plans discussed with the patient, family and they are in agreement.  CODE STATUS: FULL CODE  TOTAL TIME TAKING CARE OF THIS PATIENT: 45 minutes.   Milagros Loll R M.D on 02/25/2017 at 8:46 AM  Between 7am to 6pm - Pager - [phone]  After 6pm go to [email or web address] - password [email or web address] [facility]  [facility] [place] [facility]  Office  [phone]  CC: Primary care physician; No PCP Per Patient  Note: This dictation was prepared with Dragon dictation along with smaller phrase technology. Any transcriptional errors that result from this process are unintentional.

## 2017-02-25 NOTE — Progress Notes (Signed)
Inpatient Diabetes Program Recommendations  AACE/ADA: New Consensus Statement on Inpatient Glycemic Control (2015)  Target Ranges:  Prepandial:   less than 140 mg/dL      Peak postprandial:   less than 180 mg/dL (1-2 hours)      Critically ill patients:  140 - 180 mg/dL   Review of Glycemic Control  Diabetes history: DM 1 (Seen Dr. Aliene Altes, Endocrinology, in the past) Outpatient Diabetes medications: Lantus 28 units, Novolog tid  Current orders for Inpatient glycemic control: IV insulin  Inpatient Diabetes Program Recommendations:   As of 1041 am CO2 <7, anion gap not calculated. Patient on IV insulin. Patient in DKA this admission. Patient did not follow sick day guidelines. Attached sick day guidelines to [facility] notes. Will see patient tomorrow 4/30 when feeling better to discuss.   Patient to transition off IV insulin when CO2 normalizes in addition to anion gap within normal range. Consider the following at time of transition when appropriate:  Lantus 28 units Novolog Custom Correction scale (Dr. Aliene Altes had patient on this custom scale)      151-200  1 unit      201-250  2 units      251-300  3 units      301-350  4 units      351-400  5 units Sensitive Correction (0-9 units) tid  Novolog HS scale (0-5 units) Novolog 5 units Meal coverage (patient covers 1 unit for every 4 gm of CHO)  Thanks,  Christena Deem RN, MSN, [facility] Inpatient Diabetes Coordinator Team Pager [phone] (8a-5p)

## 2017-02-25 NOTE — Progress Notes (Signed)
Dr.Gupta notified of BMP results. Results acknowledged. No new orders at this time. Will continue to assess.

## 2017-02-25 NOTE — ED Triage Notes (Signed)
Assisted pt out of car and taken to treatment room 2; pt is diabetic and has been vomiting for several days; grunting respirations; pale; c/o generalized abd pain and weakness

## 2017-02-25 NOTE — Progress Notes (Signed)
Pt is in stable condition. Pt remains on RA, drowsy but arouses to voice. Pt remains on insulin drip. Full assessment in EPIC.Marland Kitchen Report given to [place], Charity fundraiser.

## 2017-02-25 NOTE — Plan of Care (Signed)
Problem: Education: Goal: Knowledge of [facility] Education information/materials will improve Outcome: Progressing Pt provided with welcome packet and had questions answered.   Problem: Safety: Goal: Ability to remain free from injury will improve Outcome: Progressing Pt remained free from injury on my shift.   Problem: Pain Managment: Goal: General experience of comfort will improve Outcome: Progressing Pt has demonstrated appropriate safety awareness. Pt verbalized and demonstrated appropriate understanding of how and when to use call light.   Problem: Education: Goal: Knowledge of disease or condition will improve Outcome: Progressing Pt able to verbalize sick day plan and what she believes led to her being admitted with DKA.  Problem: Fluid Volume: Goal: Signs and symptoms of dehydration will decrease Outcome: Progressing Pt's tachycardia is resolving.   Problem: Physical Regulation: Goal: Will regain or maintain usual level of consciousness Outcome: Progressing Pt remains drowsy but easily arousable  Problem: Respiratory: Goal: Respiratory status will improve Outcome: Progressing Pt respiratory rate is now WDL at 15 breaths per minute. Pt no longer having Kussmaul respirations.

## 2017-02-25 NOTE — ED Provider Notes (Addendum)
[facility] Emergency Department Provider Note  ____________________________________________   First MD Initiated Contact with Patient 02/25/17 [phone]     (approximate)  I have reviewed the triage vital signs and the nursing notes.   HISTORY  Chief Complaint Abdominal Pain; Weakness; and Emesis   HPI [name] is a 21 y.o. female with history of diabetes mellitus who is presenting to the emergency department today with nausea vomiting and diarrhea. She says that the vomiting has been ongoing for several days and that she was up last night and vomiting all night which is why she came in to the hospital this morning. Says that she is having aching body pains diffusely to constant at this time. She does not report any sick contacts. Says that she has been taking her long-acting insulin and she is a type I diabetic who has never been in DKA before.Says that she has only been taking her long-acting insulin because she has been vomiting over the past several days and has been unable to eat anything.   Past Medical History:  Diagnosis Date  . Diabetes mellitus without complication     There are no active problems to display for this patient.   No past surgical history on file.  Prior to Admission medications   Medication Sig Start Date End Date Taking? Authorizing Provider  insulin aspart (NOVOLOG) 100 UNIT/ML injection Inject into the skin 3 (three) times daily before meals. Sliding scale   Yes Historical Provider, MD  insulin glargine (LANTUS) 100 UNIT/ML injection Inject 28 Units into the skin at bedtime.   Yes Historical Provider, MD    Allergies Patient has no known allergies.  No family history on file.  Social History Social History  Substance Use Topics  . Smoking status: Never Smoker  . Smokeless tobacco: Not on file  . Alcohol use Yes    Review of Systems  Constitutional: No fever/chills Eyes: No visual changes. ENT: No sore  throat. Cardiovascular: Denies chest pain. Respiratory: Denies shortness of breath. Gastrointestinal:  No constipation. Genitourinary: Negative for dysuria. Musculoskeletal: Negative for back pain. Skin: Negative for rash. Neurological: Negative for headaches, focal weakness or numbness.   ____________________________________________   PHYSICAL EXAM:  VITAL SIGNS: ED Triage Vitals  Enc Vitals Group     BP 02/25/17 0657 (!) 157/85     Pulse --      Resp 02/25/17 0657 (!) 32     Temp 02/25/17 0657 97.8 F (36.6 C)     Temp Source 02/25/17 0657 Oral     SpO2 --      Weight 02/25/17 0702 173 lb (78.5 kg)     Height 02/25/17 0702  (1.676 m)     Head Circumference --      Peak Flow --      Pain Score --      Pain Loc --      Pain Edu? --      Excl. in GC? --     Constitutional: Alert and oriented.Ill and uncomfortable appearing. Eyes: Conjunctivae are normal. PERRL. EOMI. Head: Atraumatic. Nose: No congestion/rhinnorhea. Mouth/Throat: Mucous membranes are moist.   Neck: No stridor.   Cardiovascular: Tachycardic, regular rhythm. Grossly normal heart sounds.   Respiratory: tachypneic.  No retractions. Lungs CTAB. Gastrointestinal: Soft and nontender. No distention.  Musculoskeletal: No lower extremity tenderness nor edema.  No joint effusions. Neurologic:  Normal speech and language. No gross focal neurologic deficits are appreciated.  Skin:  Skin is  warm, dry and intact. No rash noted. Psychiatric: Mood and affect are normal. Speech and behavior are normal.  ____________________________________________   LABS (all labs ordered are listed, but only abnormal results are displayed)  Labs Reviewed  GLUCOSE, CAPILLARY - Abnormal; Notable for the following:       Result Value   Glucose-Capillary >600 (*)    All other components within normal limits  GLUCOSE, CAPILLARY - Abnormal; Notable for the following:    Glucose-Capillary >600 (*)    All other components  within normal limits  COMPREHENSIVE METABOLIC PANEL - Abnormal; Notable for the following:    Sodium 130 (*)    Potassium 5.8 (*)    Chloride 99 (*)    CO2 <7 (*)    Glucose, Bld 655 (*)    Creatinine, Ser 1.71 (*)    Total Protein 8.7 (*)    Total Bilirubin 1.4 (*)    GFR calc non Af Amer 41 (*)    GFR calc Af Amer 48 (*)    All other components within normal limits  CBC WITH DIFFERENTIAL/PLATELET - Abnormal; Notable for the following:    WBC 23.7 (*)    RBC 5.26 (*)    Hemoglobin 16.8 (*)    HCT 53.5 (*)    MCV 101.8 (*)    MCHC 31.3 (*)    RDW 14.6 (*)    Neutro Abs 20.3 (*)    All other components within normal limits  BLOOD GAS, VENOUS - Abnormal; Notable for the following:    pH, Ven <6.900 (*)    pCO2, Ven 19 (*)    pO2, Ven 65.0 (*)    All other components within normal limits  LIPASE, BLOOD  URINALYSIS, COMPLETE (UACMP) WITH MICROSCOPIC  BLOOD GAS, ARTERIAL  CBG MONITORING, ED  POC URINE PREG, ED   ____________________________________________  EKG  ED ECG REPORT I, Arelia Longest, the attending physician, personally viewed and interpreted this ECG.   Date: 02/25/2017  EKG Time: 0708  Rate: 138  Rhythm: sinus tachycardia  Axis: normal  Intervals:none  ST&T Change: No ST segment elevation or depression. No abnormal T-wave inversion.  ____________________________________________  RADIOLOGY   ____________________________________________   PROCEDURES  Procedure(s) performed:   Procedures  Critical Care performed:  CRITICAL CARE Performed by: Arelia Longest   Total critical care time: 35 minutes  Critical care time was exclusive of separately billable procedures and treating other patients.  Critical care was necessary to treat or prevent imminent or life-threatening deterioration.  Critical care was time spent personally by me on the following activities: development of treatment plan with patient and/or surrogate as well as  nursing, discussions with consultants, evaluation of patient's response to treatment, examination of patient, obtaining history from patient or surrogate, ordering and performing treatments and interventions, ordering and review of laboratory studies, ordering and review of radiographic studies, pulse oximetry and re-evaluation of patient's condition.  ____________________________________________   INITIAL IMPRESSION / ASSESSMENT AND PLAN / ED COURSE  Pertinent labs & imaging results that were available during my care of the patient were reviewed by me and considered in my medical decision making (see chart for details).  ----------------------------------------- 8:27 AM on 02/25/2017 ----------------------------------------- Patient has received 2 L of fluids. Labs returned which show severe DKA with severe acidosis. Patient is still awake and alert but tachycardic. Insulin has been ordered. The patient will be admitted to the hospital. Signed out to Dr. Hilton Sinclair.  Explained the diagnosis of the patient  as well as her mother who is at the bedside. We also discussed the severity and seriousness of the illness at this time. They're understanding and willing to comply with plan. Possibly inciting event was viral illness. Patient says that she was having nausea vomiting and diarrhea prior to her glucose being elevated.  Pt's tachypnia likely 2/2 acidosis.       ____________________________________________   FINAL CLINICAL IMPRESSION(S) / ED DIAGNOSES  DKA    NEW MEDICATIONS STARTED DURING THIS VISIT:  New Prescriptions   No medications on file     Note:  This document was prepared using Dragon voice recognition software and may include unintentional dictation errors.    Myrna Blazer, MD 02/25/17 2956    Myrna Blazer, MD 02/25/17 [phone]

## 2017-02-25 NOTE — Consult Note (Signed)
[facility] Critical Care Medicine Consultation    ASSESSMENT/PLAN   DKA ACUTE KIDNEY INJURY LEUKOCYTOSIS  PLAN Insulin drip. IVF. DKA protocol.  Will check mg/phos. UA pending. Monitor UOP/creatinine closely.  CxR wnl. Will hold off on antibiotics at this time. Will check pro-calcitonin. Will need to get set-up with PCP for follow up.  Dorothyann Gibbs MD   REFERRING MD :  Hospitalist service.  CHIEF COMPLAINT:  DKA  HISTORY OF PRESENT ILLNESS:   49F with PMH significant for IDDM came into the hospital with nausea and vomiting. Patient reports that she has been sick for the last few days. She has been taking her long acting insulin at home but not her short acting. She reports having diarrhea earlier in the week with  ? Low grade fever. Denies any dysuria. No cough. No headache. No known sick contacts. Patietn has not been following up with anyone for her DM for last many months. Reports sugars around 250 when she is not sick. She has changed 3 endocrinologists over last few years as she feels they have not helped her. No PCP at this time.  PAST MEDICAL HISTORY :  Past Medical History:  Diagnosis Date  . Diabetes mellitus without complication (HCC)    No past surgical history on file. Prior to Admission medications   Medication Sig Start Date End Date Taking? Authorizing Provider  insulin aspart (NOVOLOG) 100 UNIT/ML injection Inject into the skin 3 (three) times daily before meals. Sliding scale   Yes Historical Provider, MD  insulin glargine (LANTUS) 100 UNIT/ML injection Inject 28 Units into the skin at bedtime.   Yes Historical Provider, MD   No Known Allergies  FAMILY HISTORY:  Family History  Problem Relation Age of Onset  . Diabetes Father    SOCIAL HISTORY:  reports that she has never smoked. She does not have any smokeless tobacco history on file. She reports that she drinks alcohol. Her drug history is not on file.  REVIEW OF SYSTEMS:   The remainder of  systems were reviewed and were found to be negative other than what is documented in the HPI.    VITAL SIGNS: Temp:  [97.8 F (36.6 C)-98.7 F (37.1 C)] 98.7 F (37.1 C) (04/29 1600) Pulse Rate:  [117-139] 118 (04/29 1600) Resp:  [15-35] 18 (04/29 1600) BP: (113-157)/(63-91) 113/63 (04/29 1600) SpO2:  [96 %-100 %] 99 % (04/29 1600) Weight:  [164 lb 0.4 oz (74.4 kg)-173 lb (78.5 kg)] 164 lb 0.4 oz (74.4 kg) (04/29 1004) HEMODYNAMICS:   VENTILATOR SETTINGS:   INTAKE / OUTPUT:  Intake/Output Summary (Last 24 hours) at 02/25/17 1638 Last data filed at 02/25/17 1600  Gross per 24 hour  Intake           5307.7 ml  Output             1100 ml  Net           4207.7 ml    Physical Examination:   VS: BP 113/63 (BP Location: Right Arm)   Pulse (!) 118   Temp 98.7 F (37.1 C) (Oral)   Resp 18   Ht  (1.676 m)   Wt 164 lb 0.4 oz (74.4 kg)   LMP 02/23/2017 (Exact Date) Comment: neg preg test  SpO2 99%   BMI 26.47 kg/m    General Appearance: No distress  Neuro:without focal findings, mental status, speech normal,. HEENT: PERRLA, EOM intact, no ptosis, no other lesions noticed;  Pulmonary: normal breath sounds.,  diaphragmatic excursion normal. Cardiovascular Normal S1,S2.  No m/r/g.    Abdomen: Benign, Soft, non-tender, No masses, hepatosplenomegaly, No lymphadenopathy Renal:  No costovertebral tenderness  Skin:   warm, no rashes, no ecchymosis  Extremities: normal, no cyanosis, clubbing, no edema, warm with normal capillary refill.    LABS: Reviewed   LABORATORY PANEL:   CBC  Recent Labs Lab 02/25/17 0704  WBC 23.7*  HGB 16.8*  HCT 53.5*  PLT 434    Chemistries   Recent Labs Lab 02/25/17 0704  02/25/17 1551  NA 130*  < > 133*  K 5.8*  < > 3.9  CL 99*  < > 113*  CO2 <7*  < > 9*  GLUCOSE 655*  < > 191*  BUN 18  < > 10  CREATININE 1.71*  < > 1.00  CALCIUM 8.9  < > 7.2*  AST 38  --   --   ALT 24  --   --   ALKPHOS 112  --   --   BILITOT 1.4*  --    --   < > = values in this interval not displayed.   Recent Labs Lab 02/25/17 1108 02/25/17 1205 02/25/17 1315 02/25/17 1426 02/25/17 1536 02/25/17 1628  GLUCAP 296* 263* 234* 183* 190* 195*    Recent Labs Lab 02/25/17 0814  PHART 6.92*  PCO2ART 19*  PO2ART 140*    Recent Labs Lab 02/25/17 0704  AST 38  ALT 24  ALKPHOS 112  BILITOT 1.4*  ALBUMIN 5.0    Cardiac Enzymes No results for input(s): TROPONINI in the last 168 hours.  RADIOLOGY:  Dg Chest Port 1 View  Result Date: 02/25/2017 CLINICAL DATA:  Diabetic ketoacidosis EXAM: PORTABLE CHEST 1 VIEW COMPARISON:  August 25, 2013 FINDINGS: Lungs are clear. Heart size and pulmonary vascularity are normal. No adenopathy. No bone lesions. IMPRESSION: No edema or consolidation. Electronically Signed   By: Bretta Bang III M.D.   On: 02/25/2017 14:11      Leola Fiore MD PCCM  02/25/2017, 4:38 PM

## 2017-02-26 ENCOUNTER — Encounter: Payer: Self-pay | Admitting: *Deleted

## 2017-02-26 LAB — URINALYSIS, COMPLETE (UACMP) WITH MICROSCOPIC
Bilirubin Urine: NEGATIVE
Glucose, UA: >=500 mg/dL — AB
KETONES UR: 80 mg/dL — AB
LEUKOCYTES UA: NEGATIVE
Nitrite: NEGATIVE
PH: 5 (ref 5.0–8.0)
Protein, ur: 30 mg/dL — AB
RBC / HPF: NONE SEEN RBC/hpf (ref 0–5)
Specific Gravity, Urine: 1.013 (ref 1.005–1.030)

## 2017-02-26 LAB — BLOOD GAS, VENOUS
PATIENT TEMPERATURE: 37
pCO2, Ven: 19 mmHg — CL (ref 44.0–60.0)
pH, Ven: <6.9 — CL (ref 7.250–7.430)
pO2, Ven: 65 mmHg — ABNORMAL HIGH (ref 32.0–45.0)

## 2017-02-26 LAB — BASIC METABOLIC PANEL
Anion gap: 6 (ref 5–15)
Anion gap: 8 (ref 5–15)
BUN: 8 mg/dL (ref 6–20)
BUN: 7 mg/dL (ref 6–20)
CALCIUM: 7.2 mg/dL — AB (ref 8.9–10.3)
CHLORIDE: 111 mmol/L (ref 101–111)
CO2: 20 mmol/L — AB (ref 22–32)
CO2: 20 mmol/L — AB (ref 22–32)
CREATININE: 0.53 mg/dL (ref 0.44–1.00)
CREATININE: 0.53 mg/dL (ref 0.44–1.00)
Calcium: 7.1 mg/dL — ABNORMAL LOW (ref 8.9–10.3)
Chloride: 110 mmol/L (ref 101–111)
GFR calc Af Amer: >60 mL/min (ref >60)
GFR calc Af Amer: >60 mL/min (ref >60)
GFR calc non Af Amer: >60 mL/min (ref >60)
GFR calc non Af Amer: >60 mL/min (ref >60)
GLUCOSE: 122 mg/dL — AB (ref 65–99)
Glucose, Bld: 175 mg/dL — ABNORMAL HIGH (ref 65–99)
Potassium: 3 mmol/L — ABNORMAL LOW (ref 3.5–5.1)
Potassium: 3 mmol/L — ABNORMAL LOW (ref 3.5–5.1)
Sodium: 137 mmol/L (ref 135–145)
Sodium: 138 mmol/L (ref 135–145)

## 2017-02-26 LAB — POTASSIUM: POTASSIUM: 3.2 mmol/L — AB (ref 3.5–5.1)

## 2017-02-26 LAB — GLUCOSE, CAPILLARY
GLUCOSE-CAPILLARY: 135 mg/dL — AB (ref 65–99)
GLUCOSE-CAPILLARY: 115 mg/dL — AB (ref 65–99)
GLUCOSE-CAPILLARY: 219 mg/dL — AB (ref 65–99)
Glucose-Capillary: 114 mg/dL — ABNORMAL HIGH (ref 65–99)
Glucose-Capillary: 158 mg/dL — ABNORMAL HIGH (ref 65–99)
Glucose-Capillary: 173 mg/dL — ABNORMAL HIGH (ref 65–99)
Glucose-Capillary: 186 mg/dL — ABNORMAL HIGH (ref 65–99)
Glucose-Capillary: 194 mg/dL — ABNORMAL HIGH (ref 65–99)
Glucose-Capillary: 196 mg/dL — ABNORMAL HIGH (ref 65–99)
Glucose-Capillary: 218 mg/dL — ABNORMAL HIGH (ref 65–99)

## 2017-02-26 LAB — CBC
HCT: 35.5 % (ref 35.0–47.0)
Hemoglobin: 12.4 g/dL (ref 12.0–16.0)
MCH: 32.4 pg (ref 26.0–34.0)
MCHC: 34.8 g/dL (ref 32.0–36.0)
MCV: 93.1 fL (ref 80.0–100.0)
PLATELETS: 224 10*3/uL (ref 150–440)
RBC: 3.82 MIL/uL (ref 3.80–5.20)
RDW: 13 % (ref 11.5–14.5)
WBC: 12.4 10*3/uL — ABNORMAL HIGH (ref 3.6–11.0)

## 2017-02-26 LAB — BLOOD GAS, ARTERIAL
FIO2: 0.21
PCO2 ART: 19 mmHg — AB (ref 32.0–48.0)
PH ART: 6.92 — AB (ref 7.350–7.450)
PO2 ART: 140 mmHg — AB (ref 83.0–108.0)
Patient temperature: 37

## 2017-02-26 LAB — PROCALCITONIN: Procalcitonin: 0.46 ng/mL

## 2017-02-26 MED ORDER — INSULIN ASPART 100 UNIT/ML ~~LOC~~ SOLN
0.0000 [IU] | SUBCUTANEOUS | Status: DC
  Administered 2017-02-26: 4 [IU] via SUBCUTANEOUS
  Administered 2017-02-26 (×2): 7 [IU] via SUBCUTANEOUS
  Filled 2017-02-26 (×2): qty 7
  Filled 2017-02-26: qty 4

## 2017-02-26 MED ORDER — INSULIN ASPART 100 UNIT/ML ~~LOC~~ SOLN
3.0000 [IU] | Freq: Three times a day (TID) | SUBCUTANEOUS | Status: DC
  Administered 2017-02-27: 3 [IU] via SUBCUTANEOUS
  Filled 2017-02-26: qty 3

## 2017-02-26 MED ORDER — INSULIN GLARGINE 100 UNIT/ML ~~LOC~~ SOLN
28.0000 [IU] | Freq: Every day | SUBCUTANEOUS | Status: DC
  Administered 2017-02-26: 28 [IU] via SUBCUTANEOUS
  Filled 2017-02-26 (×2): qty 0.28

## 2017-02-26 MED ORDER — INSULIN ASPART 100 UNIT/ML ~~LOC~~ SOLN: 0.0000 [IU] | Freq: Every day | SUBCUTANEOUS | Status: DC

## 2017-02-26 MED ORDER — INSULIN ASPART 100 UNIT/ML ~~LOC~~ SOLN: 2.0000 [IU] | SUBCUTANEOUS | Status: DC | PRN

## 2017-02-26 MED ORDER — INSULIN GLARGINE 100 UNIT/ML ~~LOC~~ SOLN
25.0000 [IU] | SUBCUTANEOUS | Status: DC
  Administered 2017-02-26: 25 [IU] via SUBCUTANEOUS
  Filled 2017-02-26: qty 0.25

## 2017-02-26 MED ORDER — DEXTROSE 10 % IV SOLN: INTRAVENOUS | Status: DC | PRN

## 2017-02-26 MED ORDER — ONDANSETRON HCL 4 MG PO TABS
4.0000 mg | ORAL_TABLET | ORAL | Status: DC | PRN
  Administered 2017-02-26: 4 mg via ORAL
  Filled 2017-02-26: qty 1

## 2017-02-26 MED ORDER — TRAMADOL HCL 50 MG PO TABS
50.0000 mg | ORAL_TABLET | Freq: Four times a day (QID) | ORAL | Status: DC | PRN
  Administered 2017-02-26 – 2017-02-27 (×2): 50 mg via ORAL
  Filled 2017-02-26 (×2): qty 1

## 2017-02-26 MED ORDER — INSULIN ASPART 100 UNIT/ML ~~LOC~~ SOLN
0.0000 [IU] | Freq: Three times a day (TID) | SUBCUTANEOUS | Status: DC
  Administered 2017-02-27: 5 [IU] via SUBCUTANEOUS
  Filled 2017-02-26: qty 5

## 2017-02-26 MED ORDER — PHENOL 1.4 % MT LIQD
1.0000 | OROMUCOSAL | Status: DC | PRN
  Administered 2017-02-26 (×2): 1 via OROMUCOSAL
  Filled 2017-02-26: qty 177

## 2017-02-26 MED ORDER — ONDANSETRON HCL 4 MG PO TABS: 4.0000 mg | ORAL_TABLET | Freq: Four times a day (QID) | ORAL | Status: DC | PRN

## 2017-02-26 MED ORDER — ONDANSETRON HCL 4 MG/2ML IJ SOLN: 4.0000 mg | INTRAMUSCULAR | Status: DC | PRN

## 2017-02-26 MED ORDER — KCL IN DEXTROSE-NACL 40-5-0.45 MEQ/L-%-% IV SOLN
INTRAVENOUS | Status: DC
  Administered 2017-02-26 (×2): via INTRAVENOUS
  Filled 2017-02-26 (×5): qty 1000

## 2017-02-26 MED ORDER — POTASSIUM CHLORIDE CRYS ER 20 MEQ PO TBCR
40.0000 meq | EXTENDED_RELEASE_TABLET | Freq: Once | ORAL | Status: AC
  Administered 2017-02-26: 40 meq via ORAL
  Filled 2017-02-26: qty 2

## 2017-02-26 MED ORDER — SODIUM CHLORIDE 0.9 % IV SOLN
INTRAVENOUS | Status: DC
  Administered 2017-02-26: 06:00:00 via INTRAVENOUS

## 2017-02-26 MED ORDER — ENOXAPARIN SODIUM 40 MG/0.4ML ~~LOC~~ SOLN
40.0000 mg | SUBCUTANEOUS | Status: DC
  Administered 2017-02-27: 40 mg via SUBCUTANEOUS
  Filled 2017-02-26: qty 0.4

## 2017-02-26 NOTE — Progress Notes (Signed)
Spoke to Dr. Sherryll Burger about patient being nauseated and order for zofran was q6hrs.  He gave me a verbal order to change it to q4hrs PRN

## 2017-02-26 NOTE — Care Management (Signed)
This RNCM has contacted Dr. Maree Krabbe office numerous time- each time the receptionist says they will call this RNCM back after talking to Dr. Arlana Pouch. She states she is having a hard time connecting with Dr. Arlana Pouch to see if he can accept her. I have updated RNCM following case. IF Dr. Maree Krabbe office calls me back I will put date and time in the medical record.

## 2017-02-26 NOTE — Care Management (Signed)
RNCM consult received for medication assistance. She states she can afford her medications but she ran out of insulin because he didn't go back to her PCP. She wants to change to Dr. Dewaine Oats. I have contacted Dr. Audley Hose office for appointment [phone] and receptionist is checking with Dr. Arlana Pouch to see if they can see this patient- she said she would call me back.  Patient will need prescriptions at time of discharge. She has a glucometer that works and she states she uses it. She uses [facility] for medications. RNCM will follow. Patient doesn't care what time of day appointment is.

## 2017-02-26 NOTE — Progress Notes (Signed)
Inpatient Diabetes Program Recommendations  AACE/ADA: New Consensus Statement on Inpatient Glycemic Control (2015)  Target Ranges:  Prepandial:   less than 140 mg/dL      Peak postprandial:   less than 180 mg/dL (1-2 hours)      Critically ill patients:  140 - 180 mg/dL   Results for [name], [name] (MRN [id number]) as of 02/26/2017 14:34  Ref. Range 02/26/2017 00:47 02/26/2017 01:45 02/26/2017 02:39 02/26/2017 03:48 02/26/2017 04:53 02/26/2017 05:59 02/26/2017 07:22 02/26/2017 12:01  Glucose-Capillary Latest Ref Range: 65 - 99 mg/dL 782 (H) 956 (H) 213 (H) 135 (H) 115 (H) 114 (H) 186 (H) 219 (H)    Admit with: DKA  History: Type 1 DM  Home DM Meds: Lantus 28 units QHS       Novolog 1 unit for every 4 Grams of Carbohydrates       Novolog SSI  Current Insulin Orders: Novolog Resistant Correction Scale/ SSI (0-20 units) Q4 hours      -Spoke with patient this afternoon.  Discussed with patient diagnosis of DKA (pathophysiology), treatment of DKA, lab results, and transition plan to SQ insulin regimen.  -Reviewed Sick Day guidelines for pts living with Type 1 DM.  Encouraged pt to check her CBGs more often at home and to use her Novolog SSI as often as Q4 hours if needed on sick days.    -Patient stated she plans to seek follow-up care with Dr. Arlana Pouch in [place] after d/c.  Patient does not see an Endocrinologist currently.  Last saw Dr. Aliene Altes with the [facility] clinic last year on 12/09/15.  -Explained to pt that we have a current hemoglobin A1c pending.  Patient told me her last A1c was around 8%.      MD- Please consider the following in-hospital insulin adjustments:  1. Start Lantus 28 units QHS tonight (may want to have RN give the dose tonight at midnight since pt got 25 units Lantus at 4am this morning to transition off the IV Insulin drip)  2. Change Novolog SSI to Moderate scale (0-15 units) Q4 hours (currently ordered as Resistant scale 0-20 units Q4 hours)  3. Start Novolog Meal  Coverage once pt able to tolerate PO diet- Would start with Novolog 6 units TID with meals (hold if pt eats <50% of meal)      --Will follow patient during hospitalization--  Ambrose Finland RN, MSN, CDE Diabetes Coordinator Inpatient Glycemic Control Team Team Pager: [phone] (8a-5p)

## 2017-02-26 NOTE — Progress Notes (Signed)
Pt c/o pain at a 8. Given tylenol earlier and ibuprofen tonight, with no relief. Notified dr.vaickute and acknowledged and orders given.

## 2017-02-26 NOTE — Progress Notes (Signed)
Pt has remained alert and oriented with c/o nausea. Zofran PO given x1-effective. Pt has remained on room air-SpO2 <95%. ST in low 100s on cardiac monitor, BP WNL. Pt has poor PO intake. Pt refused breakfast-has drank water at bedside. Fiance updated and at bedside. Fluids switched to D51/2NS with 40K from NS. Pt with orders to transfer to floor.

## 2017-02-26 NOTE — Progress Notes (Addendum)
[employer] [facility] [place] at [facility]   PATIENT NAME: [name]    MR#:  [id number]  DATE OF BIRTH:  1996-02-06  SUBJECTIVE:  CHIEF COMPLAINT:   Chief Complaint  Patient presents with  . Abdominal Pain  . Weakness  . Emesis  Feeling better, although still very nauseous, all family at bedside REVIEW OF SYSTEMS:  Review of Systems  Constitutional: Positive for malaise/fatigue. Negative for chills, fever and weight loss.  HENT: Negative for nosebleeds and sore throat.   Eyes: Negative for blurred vision.  Respiratory: Negative for cough, shortness of breath and wheezing.   Cardiovascular: Negative for chest pain, orthopnea, leg swelling and PND.  Gastrointestinal: Positive for vomiting. Negative for abdominal pain, constipation, diarrhea, heartburn and nausea.  Genitourinary: Negative for dysuria and urgency.  Musculoskeletal: Negative for back pain.  Skin: Negative for rash.  Neurological: Negative for dizziness, speech change, focal weakness and headaches.  Endo/Heme/Allergies: Does not bruise/bleed easily.  Psychiatric/Behavioral: Negative for depression.   DRUG ALLERGIES:  No Known Allergies VITALS:  Blood pressure (!) 106/58, pulse (!) 103, temperature 98.8 F (37.1 C), temperature source Oral, resp. rate 18, height  (1.676 m), weight 76.4 kg (168 lb 6.9 oz), last menstrual period 02/23/2017, SpO2 100 %. PHYSICAL EXAMINATION:  Physical Exam  Constitutional: She is oriented to person, place, and time and well-developed, well-nourished, and in no distress.  HENT:  Head: Normocephalic and atraumatic.  Eyes: Conjunctivae and EOM are normal. Pupils are equal, round, and reactive to light.  Neck: Normal range of motion. Neck supple. No tracheal deviation present. No thyromegaly present.  Cardiovascular: Normal rate, regular rhythm and normal heart sounds.   Pulmonary/Chest: Effort normal and breath sounds normal. No respiratory distress. She has no  wheezes. She exhibits no tenderness.  Abdominal: Soft. Bowel sounds are normal. She exhibits no distension. There is no tenderness.  Musculoskeletal: Normal range of motion.  Neurological: She is alert and oriented to person, place, and time. No cranial nerve deficit.  Skin: Skin is warm and dry. No rash noted.  Psychiatric: Mood and affect normal.   LABORATORY PANEL:  Female CBC  Recent Labs Lab 02/26/17 0616  WBC 12.4*  HGB 12.4  HCT 35.5  PLT 224   ------------------------------------------------------------------------------------------------------------------ Chemistries   Recent Labs Lab 02/25/17 0704  02/25/17 2159  02/26/17 0616 02/26/17 1210  NA 130*  < > 135  < > 138  --   K 5.8*  < > 3.4*  < > 3.0* 3.2*  CL 99*  < > 115*  < > 110  --   CO2 <7*  < > 14*  < > 20*  --   GLUCOSE 655*  < > 112*  < > 122*  --   BUN 18  < > 9  < > 7  --   CREATININE 1.71*  < > 0.55  < > 0.53  --   CALCIUM 8.9  < > 7.4*  < > 7.2*  --   MG  --   < > 2.8*  --   --   --   AST 38  --   --   --   --   --   ALT 24  --   --   --   --   --   ALKPHOS 112  --   --   --   [facility]  --   --   --   --   --   < > =  values in this interval not displayed. RADIOLOGY:  No results found. ASSESSMENT AND PLAN:   * Severe DKA - Now resolved Off insulin drip - We will start her on insulin, Lantus 28 units subcutaneous daily at bedtime. - Start her on NovoLog 3 units subcutaneous 3 times a day. - Switch her to moderate dose sliding scale insulin.  * AKI due to dehydration from DKA - Improved with hydration  * Leucocytosis - Improving with hydration  * Hypokalemia - Replete and recheck  * Pseudohyponatremia: Resolved  * DVT prophylaxis Lovenox     All the records are reviewed and case discussed with Care Management/Social Worker. Management plans discussed with the patient, family and they are in agreement.  CODE STATUS: Full Code  TOTAL TIME TAKING CARE OF THIS  PATIENT: 35 minutes.   More than 50% of the time was spent in counseling/coordination of care: YES  POSSIBLE D/C IN 1 days, DEPENDING ON CLINICAL CONDITION.   Aquan Kope M.D on 4Delfino Lovett8 at 5:15 PM  Between 7am to 6pm - Pager - [phone]  After 6pm go to [email or web address] - Social research officer, government  [facility] [place] [facility]  Office  [phone]  CC: Primary care physician; Jaclyn Shaggy, MD  Note: This dictation was prepared with Dragon dictation along with smaller phrase technology. Any transcriptional errors that result from this process are unintentional.

## 2017-02-26 NOTE — Progress Notes (Signed)
Enoxaparin adjustment:   21 Yo female admitted to ICU for DKA, now off insulin drip. Patient ordered enoxparin  daily. Per protocol for patients with CrCl > 1mL/min, will adjust enoxaparin for  daily.    Pharmacy will continue to monitor and adjust per protocol.   MLS 4.30.18 0932

## 2017-02-26 NOTE — Progress Notes (Signed)
No distress. Off insulin gtt. Anion gap has closed. Still with nausea and anorexia. I have placed transfer orders and adjusted IVFs. Will leave on q 4 hr SSI until she is taking PO nutrition  Billy Fischer, MD PCCM service Mobile [phone] Pager [phone] 02/26/2017 9:05 AM

## 2017-02-27 LAB — GLUCOSE, CAPILLARY
GLUCOSE-CAPILLARY: 199 mg/dL — AB (ref 65–99)
GLUCOSE-CAPILLARY: 230 mg/dL — AB (ref 65–99)
Glucose-Capillary: 224 mg/dL — ABNORMAL HIGH (ref 65–99)
Glucose-Capillary: >600 mg/dL (ref 65–99)

## 2017-02-27 LAB — CBC
HCT: 34.1 % — ABNORMAL LOW (ref 35.0–47.0)
HEMOGLOBIN: 11.9 g/dL — AB (ref 12.0–16.0)
MCH: 32.5 pg (ref 26.0–34.0)
MCHC: 34.9 g/dL (ref 32.0–36.0)
MCV: 92.9 fL (ref 80.0–100.0)
PLATELETS: 174 10*3/uL (ref 150–440)
RBC: 3.67 MIL/uL — ABNORMAL LOW (ref 3.80–5.20)
RDW: 12.8 % (ref 11.5–14.5)
WBC: 5.8 10*3/uL (ref 3.6–11.0)

## 2017-02-27 LAB — BASIC METABOLIC PANEL
Anion gap: 4 — ABNORMAL LOW (ref 5–15)
CHLORIDE: 107 mmol/L (ref 101–111)
CO2: 24 mmol/L (ref 22–32)
CREATININE: 0.43 mg/dL — AB (ref 0.44–1.00)
Calcium: 7.7 mg/dL — ABNORMAL LOW (ref 8.9–10.3)
GFR calc Af Amer: >60 mL/min (ref >60)
GFR calc non Af Amer: >60 mL/min (ref >60)
GLUCOSE: 244 mg/dL — AB (ref 65–99)
Potassium: 3.4 mmol/L — ABNORMAL LOW (ref 3.5–5.1)
SODIUM: 135 mmol/L (ref 135–145)

## 2017-02-27 LAB — HEMOGLOBIN A1C
HEMOGLOBIN A1C: 8.1 % — AB (ref 4.8–5.6)
Hgb A1c MFr Bld: 8.5 % — ABNORMAL HIGH (ref 4.8–5.6)
MEAN PLASMA GLUCOSE: 197 mg/dL
Mean Plasma Glucose: 186 mg/dL

## 2017-02-27 MED ORDER — TRAMADOL HCL 50 MG PO TABS: 50.0000 mg | ORAL_TABLET | Freq: Two times a day (BID) | ORAL | 0 refills | Status: DC | PRN

## 2017-02-27 MED ORDER — INSULIN ASPART 100 UNIT/ML ~~LOC~~ SOLN: 3.0000 [IU] | Freq: Three times a day (TID) | SUBCUTANEOUS | 1 refills | Status: DC

## 2017-02-27 MED ORDER — INSULIN GLARGINE 100 UNIT/ML ~~LOC~~ SOLN: 28.0000 [IU] | Freq: Every day | SUBCUTANEOUS | 1 refills | Status: DC

## 2017-02-27 MED ORDER — PHENOL 1.4 % MT LIQD: 1.0000 | OROMUCOSAL | 0 refills | Status: DC | PRN

## 2017-02-27 MED ORDER — AMOXICILLIN-POT CLAVULANATE 875-125 MG PO TABS
1.0000 | ORAL_TABLET | Freq: Two times a day (BID) | ORAL | 0 refills | Status: DC
Start: 2017-02-27 — End: 2018-05-13

## 2017-02-27 NOTE — Progress Notes (Signed)
Inpatient Diabetes Program Recommendations  AACE/ADA: New Consensus Statement on Inpatient Glycemic Control (2015)  Target Ranges:  Prepandial:   less than 140 mg/dL      Peak postprandial:   less than 180 mg/dL (1-2 hours)      Critically ill patients:  140 - 180 mg/dL   Lab Results  Component Value Date   GLUCAP 230 (H) 02/27/2017   HGBA1C 8.1 (H) 02/26/2017    Review of Glycemic Control   Results for [name], Selena L (MRN [id number]) as of 02/27/2017 08:03  Ref. Range 02/26/2017 16:32 02/26/2017 21:17 02/27/2017 00:32 02/27/2017 04:14 02/27/2017 07:34  Glucose-Capillary Latest Ref Range: 65 - 99 mg/dL 409 (H) 811 (H) 914 (H) 199 (H) 230 (H)    History: Type 1 DM  Home DM Meds: Lantus 28 units QHS                             Novolog 1 unit for every 4 Grams of Carbohydrates                             Novolog SSI  Current Insulin Orders: Novolog moderate  Correction Scale/ SSI (0-15 units) Q4 hours, Lantus 28 units qhs   Please consider increasing Novolog to 6 units TID with meals (hold if pt eats <50% of meal)- once she is eating, please decrease Novolog correction to tid and add Novolog 0-5 units qhs.   Consider increasing Lantus to 29 units qhs since fasting CBG remains elevated.   Susette Racer, RN, BA, MHA, CDE Diabetes Coordinator Inpatient Glycemic Control Team [phone] (Team Pager) [phone] [facility] Office) 02/27/2017 8:08 AM

## 2017-02-27 NOTE — Progress Notes (Signed)
[employer] [facility] [place] at [facility] [name] was admitted to the Hospital on 02/25/2017 and Discharged  02/27/2017 and should be excused from work for 3 days starting 02/25/2017 , may return to work without any restrictions on 02/28/2017.  Delfino Lovett M.D on 02/27/2017,at 7:20 AM  [employer] [facility] [place] at [facility]  [phone]

## 2017-02-27 NOTE — Discharge Summary (Signed)
5        [employer] [facility] [place] at [facility]   PATIENT NAME: [name]    MR#:  [id number]  DATE OF BIRTH:  [birth year]  DATE OF ADMISSION:  02/25/2017   ADMITTING PHYSICIAN: Milagros Loll, MD  DATE OF DISCHARGE: 02/27/2017 11:40 AM  PRIMARY CARE PHYSICIAN: Jaclyn Shaggy, MD   ADMISSION DIAGNOSIS:  Diabetic ketoacidosis without coma associated with type 1 diabetes mellitus (HCC) [E10.10] DISCHARGE DIAGNOSIS:  Active Problems:   DKA (diabetic ketoacidoses) (HCC)   Acute kidney injury (HCC)  SECONDARY DIAGNOSIS:   Past Medical History:  Diagnosis Date  . Diabetes mellitus without complication [facility])    HOSPITAL COURSE:  21 y.o. female with a known history of IDDM here with nausea, vomiting for 1 week. Has been taking her insulin but skipped short acting insulin due to vomiting. Here she has been found to have severe DKA, AKI and leucocytosis. Diagnosed with DM with first episode of DKA. No DKA after that.  * Severe DKA -Treated aggressively with insulin drip in ICU/stepdown. -She was switched over to subQ insulin and has been tolerating it well.  * AKI due to dehydration from DKA - Improved with hydration  * Leucocytosis - Improving with hydration  * Hypokalemia - Repleted and resolved  * Pseudohyponatremia: Resolved  *Throat pain: We will check group A strep - will go ahead and treat her with empiric Augmentin as she is having difficulty swallowing, nothing obvious on oral exam.  She does have minimal erythema of the upper palate and back of her throat.  Could have sinusitis and more pharyngitis and or possible dental infection as she is also complaining of some tooth pain.  I have asked her to follow-up with her primary care physician after finishing antibiotics and or check with her dentist if she is not having any improvement in her current symptoms.  She may need outpatient ENT follow-up if no improvement in her symptoms. DISCHARGE CONDITIONS:    Stable CONSULTS OBTAINED:   DRUG ALLERGIES:  No Known Allergies DISCHARGE MEDICATIONS:   Allergies as of 02/27/2017   No Known Allergies     Medication List    TAKE these medications   amoxicillin-clavulanate 875-125 MG tablet Commonly known as:  AUGMENTIN Take 1 tablet by mouth 2 (two) times daily.   insulin aspart 100 UNIT/ML injection Commonly known as:  novoLOG Inject into the skin 3 (three) times daily before meals. Sliding scale What changed:  Another medication with the same name was added. Make sure you understand how and when to take each.   insulin aspart 100 UNIT/ML injection Commonly known as:  novoLOG Inject 3 Units into the skin 3 (three) times daily with meals. What changed:  You were already taking a medication with the same name, and this prescription was added. Make sure you understand how and when to take each.   insulin glargine 100 UNIT/ML injection Commonly known as:  LANTUS Inject 0.28 mLs (28 Units total) into the skin at bedtime.   phenol 1.4 % Liqd Commonly known as:  CHLORASEPTIC Use as directed 1 spray in the mouth or throat as needed for throat irritation / pain.   traMADol 50 MG tablet Commonly known as:  ULTRAM Take 1 tablet (50 mg total) by mouth every 12 (twelve) hours as needed for moderate pain.      DISCHARGE INSTRUCTIONS:   DIET:  Regular diet DISCHARGE CONDITION:  Good ACTIVITY:  Activity as tolerated OXYGEN:  Home Oxygen:  No.  Oxygen Delivery: room air DISCHARGE LOCATION:  home   If you experience worsening of your admission symptoms, develop shortness of breath, life threatening emergency, suicidal or homicidal thoughts you must seek medical attention immediately by calling 911 or calling your MD immediately  if symptoms less severe.  You Must read complete instructions/literature along with all the possible adverse reactions/side effects for all the Medicines you take and that have been prescribed to you. Take any new  Medicines after you have completely understood and accpet all the possible adverse reactions/side effects.   Please note  You were cared for by a hospitalist during your hospital stay. If you have any questions about your discharge medications or the care you received while you were in the hospital after you are discharged, you can call the unit and asked to speak with the hospitalist on call if the hospitalist that took care of you is not available. Once you are discharged, your primary care physician will handle any further medical issues. Please note that NO REFILLS for any discharge medications will be authorized once you are discharged, as it is imperative that you return to your primary care physician (or establish a relationship with a primary care physician if you do not have one) for your aftercare needs so that they can reassess your need for medications and monitor your lab values.    On the day of Discharge:  VITAL SIGNS:  Blood pressure 120/82, pulse 97, temperature 97.5 F (36.4 C), temperature source Oral, resp. rate 16, height  (1.676 m), weight 74.2 kg (163 lb 9.6 oz), last menstrual period 02/23/2017, SpO2 99 %. PHYSICAL EXAMINATION:  GENERAL:  21 y.o.-year-old patient lying in the bed with no acute distress.  EYES: Pupils equal, round, reactive to light and accommodation. No scleral icterus. Extraocular muscles intact.  HEENT: Head atraumatic, normocephalic. Oropharynx and nasopharynx clear.  NECK:  Supple, no jugular venous distention. No thyroid enlargement, no tenderness.  LUNGS: Normal breath sounds bilaterally, no wheezing, rales,rhonchi or crepitation. No use of accessory muscles of respiration.  CARDIOVASCULAR: S1, S2 normal. No murmurs, rubs, or gallops.  ABDOMEN: Soft, non-tender, non-distended. Bowel sounds present. No organomegaly or mass.  EXTREMITIES: No pedal edema, cyanosis, or clubbing.  NEUROLOGIC: Cranial nerves II through XII are intact. Muscle strength  5/5 in all extremities. Sensation intact. Gait not checked.  PSYCHIATRIC: The patient is alert and oriented x 3.  SKIN: No obvious rash, lesion, or ulcer.  DATA REVIEW:   CBC  Recent Labs Lab 02/27/17 0521  WBC 5.8  HGB 11.9*  HCT 34.1*  PLT 174    Chemistries   Recent Labs Lab 02/25/17 0704  02/25/17 2159  02/27/17 0521  NA 130*  < > 135  < > 135  K 5.8*  < > 3.4*  < > 3.4*  CL 99*  < > 115*  < > 107  CO2 <7*  < > 14*  < > 24  GLUCOSE 655*  < > 112*  < > 244*  BUN 18  < > 9  < > <5*  CREATININE 1.71*  < > 0.55  < > 0.43*  CALCIUM 8.9  < > 7.4*  < > 7.7*  MG  --   < > 2.8*  --   --   AST 38  --   --   --   --   ALT 24  --   --   --   --   ALKPHOS 112  --   --   --   --  BILITOT 1.4*  --   --   --   --   < > = values in this interval not displayed.   Follow-up Information    Jaclyn Shaggy, MD. Go on 03/20/2017.   Specialty:  Internal Medicine Why:  Arrive at 2:00pm Bring ID, discharge paper work from hospital, home medications, and copay Contact information: [address]   [place] [address] [phone]        Rana Snare, MD. Schedule an appointment as soon as possible for a visit in 2 week(s).   Specialty:  Internal Medicine Why:  Dr. Ollen Gross office will call you with an appointment - [phone] Contact information: [address] [place] [address] [phone]           Management plans discussed with the patient, family and they are in agreement.  CODE STATUS: Prior   TOTAL TIME TAKING CARE OF THIS PATIENT: 45 minutes.    Delfino Lovett M.D on 02/27/2017 at 5:10 PM  Between 7am to 6pm - Pager - [phone]  After 6pm go to [email or web address] - Social research officer, government  [facility] [place] [facility]  Office  [phone]  CC: Primary care physician; Jaclyn Shaggy, MD   Note: This dictation was prepared with Dragon dictation along with smaller phrase technology. Any transcriptional errors that  result from this process are unintentional.

## 2017-02-27 NOTE — Progress Notes (Signed)
Pt A and O x 4. VSS. Pt tolerating diet. No complaints of pain or nausea. IV removed intact, prescriptions given. Pt voiced understanding of discharge instructions with no further questions. Pt discharged via wheelchair with axillary.

## 2017-03-01 LAB — CULTURE, GROUP A STREP (THRC)

## 2017-10-30 NOTE — L&D Delivery Note (Signed)
       Delivery Note   Albirtha L Malena is a 22 y.o. G1P0 at 5362w5d Estimated Date of Delivery: 07/13/18  PRE-OPERATIVE DIAGNOSIS:  1) 11062w5d pregnancy.  2)  IDDM   POST-OPERATIVE DIAGNOSIS:  1) 7362w5d pregnancy s/p Vaginal, Vacuum (Extractor)    Delivery Type: Vaginal, Vacuum (Extractor)    Delivery Anesthesia: Epidural   Labor Complications:   Maternal fever.  Occ variable decelerations (cat II)   ESTIMATED BLOOD LOSS: 75  ml    FINDINGS:   1) female infant, Apgar scores of 7    at 1 minute and 9    at 5 minutes and a birthweight of    ounces.    2) Nuchal cord: no  SPECIMENS:   PLACENTA:   Appearance: Intact    Removal: Spontaneous      Disposition:     DISPOSITION:  Infant to left in stable condition in the delivery room, with L&D personnel and mother,  NARRATIVE SUMMARY: Labor course:  [name] is a G1P0 at 7962w5d who presented for induction of labor.  She was given several doses of misoprostol and underwent AROM.  She progressed to 6 cm and then received pitocin augmentation.  .  She received the appropriate anesthesia and proceeded to complete dilation. She evidenced good maternal expulsive effort during the second stage. A vacuum extraction was performed. She went on to deliver a viable infant. The placenta delivered without problems and was noted to be complete. A perineal and vaginal examination was performed. Episiotomy/Lacerations: Labial  Episiotomy or lacerations were repaired with Vicryl suture using local anesthesia. The patient tolerated this well.  Elonda Huskyavid J. Evans, M.D. 06/27/2018 11:44 PM

## 2017-12-20 DIAGNOSIS — O0990 Supervision of high risk pregnancy, unspecified, unspecified trimester: Secondary | ICD-10-CM | POA: Insufficient documentation

## 2018-01-05 DIAGNOSIS — D849 Immunodeficiency, unspecified: Secondary | ICD-10-CM | POA: Insufficient documentation

## 2018-01-31 DIAGNOSIS — F411 Generalized anxiety disorder: Secondary | ICD-10-CM

## 2018-01-31 HISTORY — DX: Generalized anxiety disorder: F41.1

## 2018-03-07 DIAGNOSIS — E109 Type 1 diabetes mellitus without complications: Secondary | ICD-10-CM | POA: Insufficient documentation

## 2018-05-13 ENCOUNTER — Ambulatory Visit (INDEPENDENT_AMBULATORY_CARE_PROVIDER_SITE_OTHER): Payer: Medicaid Other | Admitting: Obstetrics and Gynecology

## 2018-05-13 ENCOUNTER — Encounter: Payer: Self-pay | Admitting: Certified Nurse Midwife

## 2018-05-13 VITALS — BP 127/86 | HR 93 | Ht 66.0 in | Wt 186.1 lb

## 2018-05-13 DIAGNOSIS — O0993 Supervision of high risk pregnancy, unspecified, third trimester: Secondary | ICD-10-CM

## 2018-05-13 DIAGNOSIS — E119 Type 2 diabetes mellitus without complications: Secondary | ICD-10-CM

## 2018-05-13 DIAGNOSIS — Z113 Encounter for screening for infections with a predominantly sexual mode of transmission: Secondary | ICD-10-CM

## 2018-05-13 DIAGNOSIS — O99613 Diseases of the digestive system complicating pregnancy, third trimester: Secondary | ICD-10-CM

## 2018-05-13 DIAGNOSIS — Z23 Encounter for immunization: Secondary | ICD-10-CM

## 2018-05-13 DIAGNOSIS — O99619 Diseases of the digestive system complicating pregnancy, unspecified trimester: Secondary | ICD-10-CM

## 2018-05-13 DIAGNOSIS — F411 Generalized anxiety disorder: Secondary | ICD-10-CM

## 2018-05-13 DIAGNOSIS — IMO0001 Reserved for inherently not codable concepts without codable children: Secondary | ICD-10-CM

## 2018-05-13 DIAGNOSIS — O99343 Other mental disorders complicating pregnancy, third trimester: Secondary | ICD-10-CM

## 2018-05-13 DIAGNOSIS — O24113 Pre-existing diabetes mellitus, type 2, in pregnancy, third trimester: Secondary | ICD-10-CM

## 2018-05-13 DIAGNOSIS — Z3A31 31 weeks gestation of pregnancy: Secondary | ICD-10-CM

## 2018-05-13 DIAGNOSIS — O24013 Pre-existing diabetes mellitus, type 1, in pregnancy, third trimester: Secondary | ICD-10-CM

## 2018-05-13 DIAGNOSIS — K219 Gastro-esophageal reflux disease without esophagitis: Secondary | ICD-10-CM | POA: Insufficient documentation

## 2018-05-13 DIAGNOSIS — Z794 Long term (current) use of insulin: Secondary | ICD-10-CM

## 2018-05-13 DIAGNOSIS — Z130 Encounter for screening for diseases of the blood and blood-forming organs and certain disorders involving the immune mechanism: Secondary | ICD-10-CM

## 2018-05-13 LAB — POCT URINALYSIS DIPSTICK
Bilirubin, UA: NEGATIVE
GLUCOSE UA: NEGATIVE
Nitrite, UA: NEGATIVE
Odor: NEGATIVE
PH UA: 6.5 (ref 5.0–8.0)
Protein, UA: POSITIVE — AB
RBC UA: NEGATIVE
SPEC GRAV UA: 1.025 (ref 1.010–1.025)
UROBILINOGEN UA: 0.2 U/dL

## 2018-05-13 MED ORDER — TETANUS-DIPHTH-ACELL PERTUSSIS 5-2.5-18.5 LF-MCG/0.5 IM SUSP
0.5000 mL | Freq: Once | INTRAMUSCULAR | Status: AC
  Administered 2018-05-13: 0.5 mL via INTRAMUSCULAR

## 2018-05-13 MED ORDER — RANITIDINE HCL 150 MG PO TABS: 150.0000 mg | ORAL_TABLET | Freq: Two times a day (BID) | ORAL | 3 refills | Status: DC

## 2018-05-13 NOTE — Progress Notes (Signed)
Pt is transfer into practice At 31wks. She has history of IDDM and needs to see MD. Discussed with pt. She verbalizes understanding and agrees to plan of care.   Doreene BurkeAnnie Dudley Cooley, CNM

## 2018-05-13 NOTE — Patient Instructions (Addendum)
Eating Plan for Pregnant Women While you are pregnant, your body will require additional nutrition to help support your growing baby. It is recommended that you consume:  150 additional calories each day during your first trimester.  300 additional calories each day during your second trimester.  300 additional calories each day during your third trimester.  Eating a healthy, well-balanced diet is very important for your health and for your baby's health. You also have a higher need for some vitamins and minerals, such as folic acid, calcium, iron, and vitamin D. What do I need to know about eating during pregnancy?  Do not try to lose weight or go on a diet during pregnancy.  Choose healthy, nutritious foods. Choose  of a sandwich with a glass of milk instead of a candy bar or a high-calorie sugar-sweetened beverage.  Limit your overall intake of foods that have "empty calories." These are foods that have little nutritional value, such as sweets, desserts, candies, sugar-sweetened beverages, and fried foods.  Eat a variety of foods, especially fruits and vegetables.  Take a prenatal vitamin to help meet the additional needs during pregnancy, specifically for folic acid, iron, calcium, and vitamin D.  Remember to stay active. Ask your health care provider for exercise recommendations that are specific to you.  Practice good food safety and cleanliness, such as washing your hands before you eat and after you prepare raw meat. This helps to prevent foodborne illnesses, such as listeriosis, that can be very dangerous for your baby. Ask your health care provider for more information about listeriosis. What does 150 extra calories look like? Healthy options for an additional 150 calories each day could be any of the following:  Plain low-fat yogurt (6-8 oz) with  cup of berries.  1 apple with 2 teaspoons of peanut butter.  Cut-up vegetables with  cup of hummus.  Low-fat chocolate milk  (8 oz or 1 cup).  1 string cheese with 1 medium orange.   of a peanut butter and jelly sandwich on whole-wheat bread (1 tsp of peanut butter).  For 300 calories, you could eat two of those healthy options each day. What is a healthy amount of weight to gain? The recommended amount of weight for you to gain is based on your pre-pregnancy BMI. If your pre-pregnancy BMI was:  Less than 18 (underweight), you should gain 28-40 lb.  18-24.9 (normal), you should gain 25-35 lb.  25-29.9 (overweight), you should gain 15-25 lb.  Greater than 30 (obese), you should gain 11-20 lb.  What if I am having twins or multiples? Generally, pregnant women who will be having twins or multiples may need to increase their daily calories by 300-600 calories each day. The recommended range for total weight gain is 25-54 lb, depending on your pre-pregnancy BMI. Talk with your health care provider for specific guidance about additional nutritional needs, weight gain, and exercise during your pregnancy. What foods can I eat? Grains Any grains. Try to choose whole grains, such as whole-wheat bread, oatmeal, or brown rice. Vegetables Any vegetables. Try to eat a variety of colors and types of vegetables to get a full range of vitamins and minerals. Remember to wash your vegetables well before eating. Fruits Any fruits. Try to eat a variety of colors and types of fruit to get a full range of vitamins and minerals. Remember to wash your fruits well before eating. Meats and Other Protein Sources Lean meats, including chicken, [place], fish, and lean cuts of beef, veal,  or pork. Make sure that all meats are cooked to "well done." Tofu. Tempeh. Beans. Eggs. Peanut butter and other nut butters. Seafood, such as shrimp, crab, and lobster. If you choose fish, select types that are higher in omega-3 fatty acids, including salmon, herring, mussels, trout, sardines, and pollock. Make sure that all meats are cooked to food-safe  temperatures. Dairy Pasteurized milk and milk alternatives. Pasteurized yogurt and pasteurized cheese. Cottage cheese. Sour cream. Beverages Water. Juices that contain 100% fruit juice or vegetable juice. Caffeine-free teas and decaffeinated coffee. Drinks that contain caffeine are okay to drink, but it is better to avoid caffeine. Keep your total caffeine intake to less than 200 mg each day (12 oz of coffee, tea, or soda) or as directed by your health care provider. Condiments Any pasteurized condiments. Sweets and Desserts Any sweets and desserts. Fats and Oils Any fats and oils. The items listed above may not be a complete list of recommended foods or beverages. Contact your dietitian for more options. What foods are not recommended? Vegetables Unpasteurized (raw) vegetable juices. Fruits Unpasteurized (raw) fruit juices. Meats and Other Protein Sources Cured meats that have nitrates, such as bacon, salami, and hotdogs. Luncheon meats, bologna, or other deli meats (unless they are reheated until they are steaming hot). Refrigerated pate, meat spreads from a meat counter, smoked seafood that is found in the refrigerated section of a store. Raw fish, such as sushi or sashimi. High mercury content fish, such as tilefish, shark, swordfish, and king mackerel. Raw meats, such as tuna or beef tartare. Undercooked meats and poultry. Make sure that all meats are cooked to food-safe temperatures. Dairy Unpasteurized (raw) milk and any foods that have raw milk in them. Soft cheeses, such as feta, queso blanco, queso fresco, Brie, Camembert cheeses, blue-veined cheeses, and Panela cheese (unless it is made with pasteurized milk, which must be stated on the label). Beverages Alcohol. Sugar-sweetened beverages, such as sodas, teas, or energy drinks. Condiments Homemade fermented foods and drinks, such as pickles, sauerkraut, or kombucha drinks. (Store-bought pasteurized versions of these are  okay.) Other Salads that are made in the store, such as ham salad, chicken salad, egg salad, tuna salad, and seafood salad. The items listed above may not be a complete list of foods and beverages to avoid. Contact your dietitian for more information. This information is not intended to replace advice given to you by your health care provider. Make sure you discuss any questions you have with your health care provider. Document Released: 07/31/2014 Document Revised: 03/23/2016 Document Reviewed: 03/31/2014 Elsevier Interactive Patient Education  2018 [employer] Prenatal Care WHAT IS PRENATAL CARE? Prenatal care is the process of caring for a pregnant woman before she gives birth. Prenatal care makes sure that she and her baby remain as healthy as possible throughout pregnancy. Prenatal care may be provided by a midwife, family practice health care provider, or a childbirth and pregnancy specialist (obstetrician). Prenatal care may include physical examinations, testing, treatments, and education on nutrition, lifestyle, and social support services. WHY IS PRENATAL CARE SO IMPORTANT? Early and consistent prenatal care increases the chance that you and your baby will remain healthy throughout your pregnancy. This type of care also decreases a baby's risk of being born too early (prematurely), or being born smaller than expected (small for gestational age). Any underlying medical conditions you may have that could pose a risk during your pregnancy are discussed during prenatal care visits. You will also be monitored regularly for any new   conditions that may arise during your pregnancy so they can be treated quickly and effectively. WHAT HAPPENS DURING PRENATAL CARE VISITS? Prenatal care visits may include the following: Discussion Tell your health care provider about any new signs or symptoms you have experienced since your last visit. These might include:  Nausea or vomiting.  Increased or  decreased level of energy.  Difficulty sleeping.  Back or leg pain.  Weight changes.  Frequent urination.  Shortness of breath with physical activity.  Changes in your skin, such as the development of a rash or itchiness.  Vaginal discharge or bleeding.  Feelings of excitement or nervousness.  Changes in your baby's movements.  You may want to write down any questions or topics you want to discuss with your health care provider and bring them with you to your appointment. Examination During your first prenatal care visit, you will likely have a complete physical exam. Your health care provider will often examine your vagina, cervix, and the position of your uterus, as well as check your heart, lungs, and other body systems. As your pregnancy progresses, your health care provider will measure the size of your uterus and your baby's position inside your uterus. He or she may also examine you for early signs of labor. Your prenatal visits may also include checking your blood pressure and, after about 10-12 weeks of pregnancy, listening to your baby's heartbeat. Testing Regular testing often includes:  Urinalysis. This checks your urine for glucose, protein, or signs of infection.  Blood count. This checks the levels of white and red blood cells in your body.  Tests for sexually transmitted infections (STIs). Testing for STIs at the beginning of pregnancy is routinely done and is required in many states.  Antibody testing. You will be checked to see if you are immune to certain illnesses, such as rubella, that can affect a developing fetus.  Glucose screen. Around 24-28 weeks of pregnancy, your blood glucose level will be checked for signs of gestational diabetes. Follow-up tests may be recommended.  Group B strep. This is a bacteria that is commonly found inside a woman's vagina. This test will inform your health care provider if you need an antibiotic to reduce the amount of this  bacteria in your body prior to labor and childbirth.  Ultrasound. Many pregnant women undergo an ultrasound screening around 18-20 weeks of pregnancy to evaluate the health of the fetus and check for any developmental abnormalities.  HIV (human immunodeficiency virus) testing. Early in your pregnancy, you will be screened for HIV. If you are at high risk for HIV, this test may be repeated during your third trimester of pregnancy.  You may be offered other testing based on your age, personal or family medical history, or other factors. HOW OFTEN SHOULD I PLAN TO SEE MY HEALTH CARE PROVIDER FOR PRENATAL CARE? Your prenatal care check-up schedule depends on any medical conditions you have before, or develop during, your pregnancy. If you do not have any underlying medical conditions, you will likely be seen for checkups:  Monthly, during the first 6 months of pregnancy.  Twice a month during months 7 and 8 of pregnancy.  Weekly starting in the 9th month of pregnancy and until delivery.  If you develop signs of early labor or other concerning signs or symptoms, you may need to see your health care provider more often. Ask your health care provider what prenatal care schedule is best for you. WHAT CAN I DO TO KEEP MYSELF AND  MY BABY AS HEALTHY AS POSSIBLE DURING MY PREGNANCY?  Take a prenatal vitamin containing 400 micrograms (0.4 mg) of folic acid every day. Your health care provider may also ask you to take additional vitamins such as iodine, vitamin D, iron, copper, and zinc.  Take 1500-2000 mg of calcium daily starting at your 20th week of pregnancy until you deliver your baby.  Make sure you are up to date on your vaccinations. Unless directed otherwise by your health care provider: ? You should receive a tetanus, diphtheria, and pertussis (Tdap) vaccination between the 27th and 36th week of your pregnancy, regardless of when your last Tdap immunization occurred. This helps protect your baby  from whooping cough (pertussis) after he or she is born. ? You should receive an annual inactivated influenza vaccine (IIV) to help protect you and your baby from influenza. This can be done at any point during your pregnancy.  Eat a well-rounded diet that includes: ? Fresh fruits and vegetables. ? Lean proteins. ? Calcium-rich foods such as milk, yogurt, hard cheeses, and dark, leafy greens. ? Whole grain breads.  Do noteat seafood high in mercury, including: ? Swordfish. ? Tilefish. ? Shark. ? King mackerel. ? More than 6 oz tuna per week.  Do not eat: ? Raw or undercooked meats or eggs. ? Unpasteurized foods, such as soft cheeses (brie, blue, or feta), juices, and milks. ? Lunch meats. ? Hot dogs that have not been heated until they are steaming.  Drink enough water to keep your urine clear or pale yellow. For many women, this may be 10 or more 8 oz glasses of water each day. Keeping yourself hydrated helps deliver nutrients to your baby and may prevent the start of pre-term uterine contractions.  Do not use any tobacco products including cigarettes, chewing tobacco, or electronic cigarettes. If you need help quitting, ask your health care provider.  Do not drink beverages containing alcohol. No safe level of alcohol consumption during pregnancy has been determined.  Do not use any illegal drugs. These can harm your developing baby or cause a miscarriage.  Ask your health care provider or pharmacist before taking any prescription or over-the-counter medicines, herbs, or supplements.  Limit your caffeine intake to no more than 200 mg per day.  Exercise. Unless told otherwise by your health care provider, try to get 30 minutes of moderate exercise most days of the week. Do not  do high-impact activities, contact sports, or activities with a high risk of falling, such as horseback riding or downhill skiing.  Get plenty of rest.  Avoid anything that raises your body temperature,  such as hot tubs and saunas.  If you own a cat, do not empty its litter box. Bacteria contained in cat feces can cause an infection called toxoplasmosis. This can result in serious harm to the fetus.  Stay away from chemicals such as insecticides, lead, mercury, and cleaning or paint products that contain solvents.  Do not have any X-rays taken unless medically necessary.  Take a childbirth and breastfeeding preparation class. Ask your health care provider if you need a referral or recommendation.  This information is not intended to replace advice given to you by your health care provider. Make sure you discuss any questions you have with your health care provider. Document Released: 10/19/2003 Document Revised: 03/20/2016 Document Reviewed: 12/31/2013 Elsevier Interactive Patient Education  2017 [employer].  Type 1 or Type 2 Diabetes Mellitus During Pregnancy, Self Care When you have type 1 or  type 2 diabetes (diabetes mellitus), you must keep your blood sugar (glucose) under control. You can do this with:  Nutrition.  Exercise.  Lifestyle changes.  Insulin or medicines, if needed.  Support from your doctors and others.  How do I manage my blood sugar?  Check your blood sugar every day, as often as told.  Call your doctor if your blood sugar is above your goal numbers for 2 tests in a row.  Have your A1c (hemoglobin A1c) level checked at least two times a year. Have it checked more often if your doctor tells you to do that. Your doctor will set treatment goals for you. In general, you should have these blood sugar levels:  After not eating for a long time (fasting): 95 mg/dL (5.3 mmol/L).  After meals (postprandial): ? One hour after a meal: at or below 140 mg/dL (7.8 mmol/L). ? Two hours after a meal: at or below 120 mg/dL (6.7 mmol/L).  A1c level: 6-6.5%.  What do I need to know about high blood sugar? High blood sugar is called hyperglycemia. Know the signs of  high blood sugar. Signs may include:  Feeling: ? Thirsty. ? Hungry. ? Very tired.  Needing to pee (urinate) more than usual.  Blurry vision.  What do I need to know about low blood sugar? Low blood sugar is called hypoglycemia. This is when blood sugar is at or below 70 mg/dL (3.9 mmol/L). Symptoms may include:  Feeling: ? Hungry. ? Worried or nervous (anxious). ? Sweaty and clammy. ? Confused. ? Dizzy. ? Sleepy. ? Sick to your stomach (nauseous).  Having: ? A fast heartbeat. ? A headache. ? A change in your vision. ? Jerky movements that you cannot control (seizure). ? Nightmares. ? Tingling or no feeling (numbness) around the mouth, lips, or tongue.  Having trouble with: ? Talking. ? Paying attention (concentrating). ? Moving (coordination). ? Sleeping.  Shaking.  Passing out (fainting).  Getting upset easily (irritability).  Treating low blood sugar  To treat low blood sugar, eat or drink something sugary right away. If you can think clearly and swallow safely, follow the 15:15 rule:  Take 15 grams of a fast-acting carb (carbohydrate). Some fast-acting carbs are: ? 1 tube of glucose gel. ? 3 sugar tablets (glucose pills). ? 6-8 pieces of hard candy. ? 4 oz (120 mL) of fruit juice. ? 4 oz (120 mL) regular (not diet) soda.  Check your blood sugar 15 minutes after you take the carb.  If your blood sugar is still at or below 70 mg/dL (3.9 mmol/L), take 15 grams of a carb again.  If your blood sugar does not go above 70 mg/dL (3.9 mmol/L) after 3 tries, get help right away.  After your blood sugar goes back to normal, eat a meal or a snack within 1 hour.  Treating very low blood sugar If your blood sugar is at or below 54 mg/dL (3 mmol/L), you have very low blood sugar (severe hypoglycemia). This is an emergency. Do not wait to see if the symptoms will go away. Get medical help right away. Call your local emergency services (911 in the [place] Do not drive  yourself to the hospital. If you have very low blood sugar and you cannot eat or drink, you may need a glucagon shot (injection). A family member or friend should learn:  How to check your blood sugar.  How to give you a glucagon shot.  Ask your doctor if you need a glucagon  shot kit at home. What else is important to manage my diabetes? Medicine Follow these instructions about insulin and diabetes medicines:  Take them as told by your doctor.  Adjust them as told by your doctor.  Do not run out of them.  Having diabetes can put you at risk for other long-term (chronic) conditions. These may include heart disease and kidney disease. Your doctor may prescribe medicines to help prevent problems from diabetes. Food   Make healthy food choices. These include: ? Chicken, fish, egg whites, and beans. ? Oats, whole wheat, bulgur, brown rice, quinoa, and millet. ? Fresh fruits and vegetables. ? Low-fat dairy products. ? Nuts, avocado, olive oil, and canola oil.  Meet with a food specialist (dietitian) to make an eating plan that is right for you.  Follow instructions from your doctor about what you cannot eat or drink.  Drink enough fluid to keep your pee (urine) clear or pale yellow.  Eat healthy snacks between healthy meals.  Keep track of the carbs you eat. Read food labels. Learn the standard serving sizes of foods.  Follow your sick day plan when you cannot eat or drink normally. Make this plan with your doctor so it is ready. Activity  Exercise for 30 minutes or more a day during your pregnancy or as much as told by your doctor.  Talk with your doctor before you start a new exercise. Your doctor may need to adjust your insulin, medicines, or food. Lifestyle   Do not drink alcohol.  Do not use any tobacco products, such as cigarettes, chewing tobacco, and e-cigarettes. If you need help quitting, ask your doctor.  Learn how to deal with stress. If you need help with  this, ask your doctor. Body care  Stay up to date with your shots (immunizations).  Get an eye exam during your first trimester.  Check your skin and feet every day. Check for cuts, bruises, redness, blisters, or sores.  Get regular foot exams as told by your doctor.  Brush your teeth and gums two times a day. Floss at least one time a day.  Go to the dentist least once every 6 months.  Stay at a healthy weight during your pregnancy. General instructions   Take over-the-counter and prescription medicines only as told by your doctor.  Talk with your doctor about your risk for high blood pressure during pregnancy (preeclampsia or eclampsia).  Share your diabetes care plan with: ? Your work or school. ? People you live with.  Check your pee for ketones: ? When you are sick. ? As told by your doctor.  Carry a card or wear jewelry that says that you have diabetes.  Ask your doctor: ? Do I need to meet with a diabetes educator? ? Where can I find a support group for people with diabetes?  Keep all follow-up visits with your doctor. This is important. Where to find more information: To learn more about diabetes, visit:  American Diabetes Association: www.diabetes.org  American Association of Diabetes Educators (AADE): www.diabeteseducator.org/patient-resources  This information is not intended to replace advice given to you by your health care provider. Make sure you discuss any questions you have with your health care provider. Document Released: 02/07/2016 Document Revised: 08/30/2016 Document Reviewed: 11/19/2015 Elsevier Interactive Patient Education  2017 [employer].

## 2018-05-13 NOTE — Progress Notes (Signed)
TRANSFER IN OB HISTORY AND PHYSICAL  SUBJECTIVE:       [name] is a 22 y.o. G1P0 female, Patient's last menstrual period was 10/06/2017., Estimated Date of Delivery: 07/13/18, [redacted]w[redacted]d (consistent with first trimester ultrasound), presents today for Transition of Prenatal Care. EPIC data migration from outside records is accomplished today.  She has relocated from [place], [place]. Her last prenatal visit was at [redacted] weeks gestation, initial visit was at [redacted] weeks gestation. 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 She has also been treated for a UTI this pregnancy.   Complaints today include: 1.  worsening gastric reflux, more so at night, no longer controlled with Tums.  She denies LOF, contractions, vaginal bleeding, and notes good fetal movement. 2.She also reports that her blood pressures and blood sugars have been elevated over the past week, has never had problems before in the pregnancy, but since having food poisoning last week, all of her levels have been "extremely off".      Gynecologic History Patient's last menstrual period was 10/06/2017. Normal Contraception: none.  Previously had a Skyla IUD. Last Pap: unsure if she has ever had a pap smear.    Obstetric History OB History  Gravida Para Term Preterm AB Living  1            SAB TAB Ectopic Multiple Live Births               # Outcome Date GA Lbr Len/2nd Weight Sex Delivery Anes PTL Lv  1 Current             Past Medical History:  Diagnosis Date  . Diabetes mellitus without complication (HCC)   . DKA (diabetic ketoacidoses) (HCC) 02/25/2017  . Generalized anxiety disorder 01/31/2018  . Heart murmur    at birth    Past Surgical History:  Procedure Laterality Date  . NO PAST SURGERIES      Current Outpatient Medications on File Prior to Visit  Medication Sig Dispense Refill  . insulin aspart (NOVOLOG) 100 UNIT/ML injection Inject 15 Units into the skin 3 (three) times daily before meals.  Sliding scale     . insulin detemir (LEVEMIR) 100 UNIT/ML injection Inject 32 Units into the skin at bedtime.    . Prenatal Vit-Fe Fumarate-FA (PRENATAL MULTIVITAMIN) TABS tablet Take 1 tablet by mouth daily at 12 noon.     No current facility-administered medications on file prior to visit.     Allergies  Allergen Reactions  . Sulfamethoxazole-Trimethoprim Hives  . Pravastatin Other (See Comments)    Social History   Socioeconomic History  . Marital status: Single    Spouse name: Not on file  . Number of children: Not on file  . Years of education: Not on file  . Highest education level: Not on file  Occupational History  . Not on file  Social Needs  . Financial resource strain: Not on file  . Food insecurity:    Worry: Not on file    Inability: Not on file  . Transportation needs:    Medical: Not on file    Non-medical: Not on file  Tobacco Use  . Smoking status: Never Smoker  . Smokeless tobacco: Never Used  Substance and Sexual Activity  . Alcohol use: Not Currently  . Drug use: Never  . Sexual activity: Yes    Birth control/protection: None  Lifestyle  . Physical activity:    Days per week: Not on file  Minutes per session: Not on file  . Stress: Not on file  Relationships  . Social connections:    Talks on phone: Not on file    Gets together: Not on file    Attends religious service: Not on file    Active member of club or organization: Not on file    Attends meetings of clubs or organizations: Not on file    Relationship status: Not on file  . Intimate partner violence:    Fear of current or ex partner: Not on file    Emotionally abused: Not on file    Physically abused: Not on file    Forced sexual activity: Not on file  Other Topics Concern  . Not on file  Social History Narrative  . Not on file    Family History  Problem Relation Age of Onset  . Diabetes Father   . Cancer Neg Hx     The following portions of the patient's history were  reviewed and updated as appropriate: allergies, current medications, past OB history, past medical history, past surgical history, past family history, past social history, and problem list.    OBJECTIVE: Initial Physical Exam (New OB)  GENERAL APPEARANCE: alert, well appearing HEAD: normocephalic, atraumatic MOUTH: mucous membranes moist, pharynx normal without lesions THYROID: no thyromegaly or masses present BREASTS: no masses noted, no significant tenderness, no palpable axillary nodes, no skin changes LUNGS: clear to auscultation, no wheezes, rales or rhonchi, symmetric air entry HEART: regular rate and rhythm, no murmurs ABDOMEN: soft, nontender, nondistended, no abnormal masses, no epigastric pain EXTREMITIES: no redness or tenderness in the calves or thighs SKIN: normal coloration and turgor, no rashes LYMPH NODES: no adenopathy palpable NEUROLOGIC: alert, oriented, normal speech, no focal findings or movement disorder noted  PELVIC EXAM deferred  ASSESSMENT: Supervision of high risk pregnancy, antepartum, third trimester IDDM (insulin dependent diabetes mellitus) (HCC)  Gastroesophageal reflux in pregnancy Need for Tdap vaccination   PLAN: 1. Supervision of high risk pregnancy, antepartum, third trimester - Prenatal labs reviewed in records, normal.  - Prenatal vitamins encouraged. - Problem list reviewed and updated. - Normal cell-free DNA genetic testing, however there was a discrepancy of gender on genetic test vs anatomy scan, had amniocentesis performed which confirmed normal female infant.  - Reviewed prenatal care, given practice overview. Discussed appropriate weight gain in pregnancy, exercise.  Patient notes lag in care was due to difficultly in obtaining Medicaid for her new county.  - DIscussed that due to diabetes in pregnancy, she will require antenatal testing beginning at 32 weeks, and serial growth scans. Will order. She will also be delivered at or  before [redacted] weeks gestation, depending on her glycemic control.  - Blood consents signed. Desires to breastfeed.  Considering Skyla IUD for contraception - Will need repeat CBC and RPR    2. IDDM (insulin dependent diabetes mellitus) (HCC)  - Currently [facility]. Next appointment is July 31st.  Notes blood sugars over the past week have been in the 300s since she had a bout of food poisoning. Has increased her insulin intake several times on her own without much improvement.  - Does not desire to re-establish with the MFM at this point since Endocrinology is managing her blood sugars. However did explain that with DM patient will need a fetal echo as she transitioned her care prior to having once completed.  Will place one-time consultation for ultrasound.  - Continue daily aspirin 81  mg  3. Gastroesophageal reflux in pregnancy  - Not relieved by Tums.  Will prescribe Zantac  4. Need for Tdap vaccination  - Tdap given today.  Declined flu vaccine early in the pregnancy.   5. 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 No further management needed per previous MFM.   6. H/o anxiety - previously on Buspar prior to pregnancy, but discontinued. Notes symptoms have been controlled on no meds.    RTC in 2 weeks for OB visit, 1 week for growth scan and to begin NSTs.   >50% of 30 min visit spent on counseling and coordination of care The patient has Medicaid.  [facility] Medicaid Risk Screening Form completed today.     Hildred Laser, MD [facility]

## 2018-05-13 NOTE — Progress Notes (Signed)
[place]B transfer from [place]

## 2018-05-20 ENCOUNTER — Ambulatory Visit (INDEPENDENT_AMBULATORY_CARE_PROVIDER_SITE_OTHER): Payer: Medicaid Other | Admitting: Obstetrics and Gynecology

## 2018-05-20 ENCOUNTER — Encounter: Payer: Self-pay | Admitting: Obstetrics and Gynecology

## 2018-05-20 ENCOUNTER — Other Ambulatory Visit: Payer: Medicaid Other

## 2018-05-20 ENCOUNTER — Ambulatory Visit (INDEPENDENT_AMBULATORY_CARE_PROVIDER_SITE_OTHER): Payer: Medicaid Other

## 2018-05-20 ENCOUNTER — Ambulatory Visit: Payer: Medicaid Other

## 2018-05-20 VITALS — BP 121/79 | HR 77 | Wt 198.2 lb

## 2018-05-20 DIAGNOSIS — Z3A32 32 weeks gestation of pregnancy: Secondary | ICD-10-CM | POA: Diagnosis not present

## 2018-05-20 DIAGNOSIS — Z113 Encounter for screening for infections with a predominantly sexual mode of transmission: Secondary | ICD-10-CM

## 2018-05-20 DIAGNOSIS — Z794 Long term (current) use of insulin: Secondary | ICD-10-CM | POA: Diagnosis not present

## 2018-05-20 DIAGNOSIS — O24113 Pre-existing diabetes mellitus, type 2, in pregnancy, third trimester: Secondary | ICD-10-CM | POA: Diagnosis not present

## 2018-05-20 DIAGNOSIS — O0993 Supervision of high risk pregnancy, unspecified, third trimester: Secondary | ICD-10-CM

## 2018-05-20 DIAGNOSIS — O24013 Pre-existing diabetes mellitus, type 1, in pregnancy, third trimester: Secondary | ICD-10-CM

## 2018-05-20 DIAGNOSIS — E119 Type 2 diabetes mellitus without complications: Secondary | ICD-10-CM

## 2018-05-20 DIAGNOSIS — IMO0001 Reserved for inherently not codable concepts without codable children: Secondary | ICD-10-CM

## 2018-05-20 LAB — POCT URINALYSIS DIPSTICK
BILIRUBIN UA: NEGATIVE
Blood, UA: NEGATIVE
Glucose, UA: NEGATIVE
Leukocytes, UA: NEGATIVE
Nitrite, UA: NEGATIVE
PROTEIN UA: POSITIVE — AB
Spec Grav, UA: 1.015 (ref 1.010–1.025)
Urobilinogen, UA: 0.2 E.U./dL
pH, UA: 7 (ref 5.0–8.0)

## 2018-05-20 NOTE — Progress Notes (Signed)
ROB: Patient has no complaints.  She has been increasing her daily use of insulin both long-acting and short acting.  She has an appointment next week with her endocrinologist to further adjust this.  Taking into account the way she usually does her fingersticks I have asked her to keep fastings and preprandial's and to bring them to each visit.  She says that over the last 2 weeks her sugars have sometimes been over 200.  I have discussed the need for tighter glycemic control and she is attempting this.  I would be interested in seeing what her endocrinologist decides in 1 week. Patient scheduled for ultrasound today for growth. NST- reactive  NONSTRESS TEST INTERPRETATION  INDICATIONS: IDDM FHR baseline: 130 RESULTS:  reactive COMMENTS:   PLAN: 1. Continue fetal kick counts as directed. 2. Continue antepartum testing as scheduled.  Elonda Huskyavid J. Stefan Markarian, M.D. 05/20/2018 12:02 PM

## 2018-05-20 NOTE — Progress Notes (Signed)
ROB-pt stated that her feet are swelling.

## 2018-05-21 LAB — CBC
HEMATOCRIT: 30.9 % — AB (ref 34.0–46.6)
Hemoglobin: 10.6 g/dL — ABNORMAL LOW (ref 11.1–15.9)
MCH: 31.2 pg (ref 26.6–33.0)
MCHC: 34.3 g/dL (ref 31.5–35.7)
MCV: 91 fL (ref 79–97)
Platelets: 173 10*3/uL (ref 150–450)
RBC: 3.4 x10E6/uL — ABNORMAL LOW (ref 3.77–5.28)
RDW: 13.1 % (ref 12.3–15.4)
WBC: 7.8 10*3/uL (ref 3.4–10.8)

## 2018-05-21 LAB — RPR: RPR Ser Ql: NONREACTIVE

## 2018-05-27 IMAGING — DX DG CHEST 1V PORT
1 series · 1 of 1 positions shown · non-contrast
Comparison: August 25, 2013

CLINICAL DATA: Diabetic ketoacidosis

EXAM:
PORTABLE CHEST 1 VIEW

[chest ap]
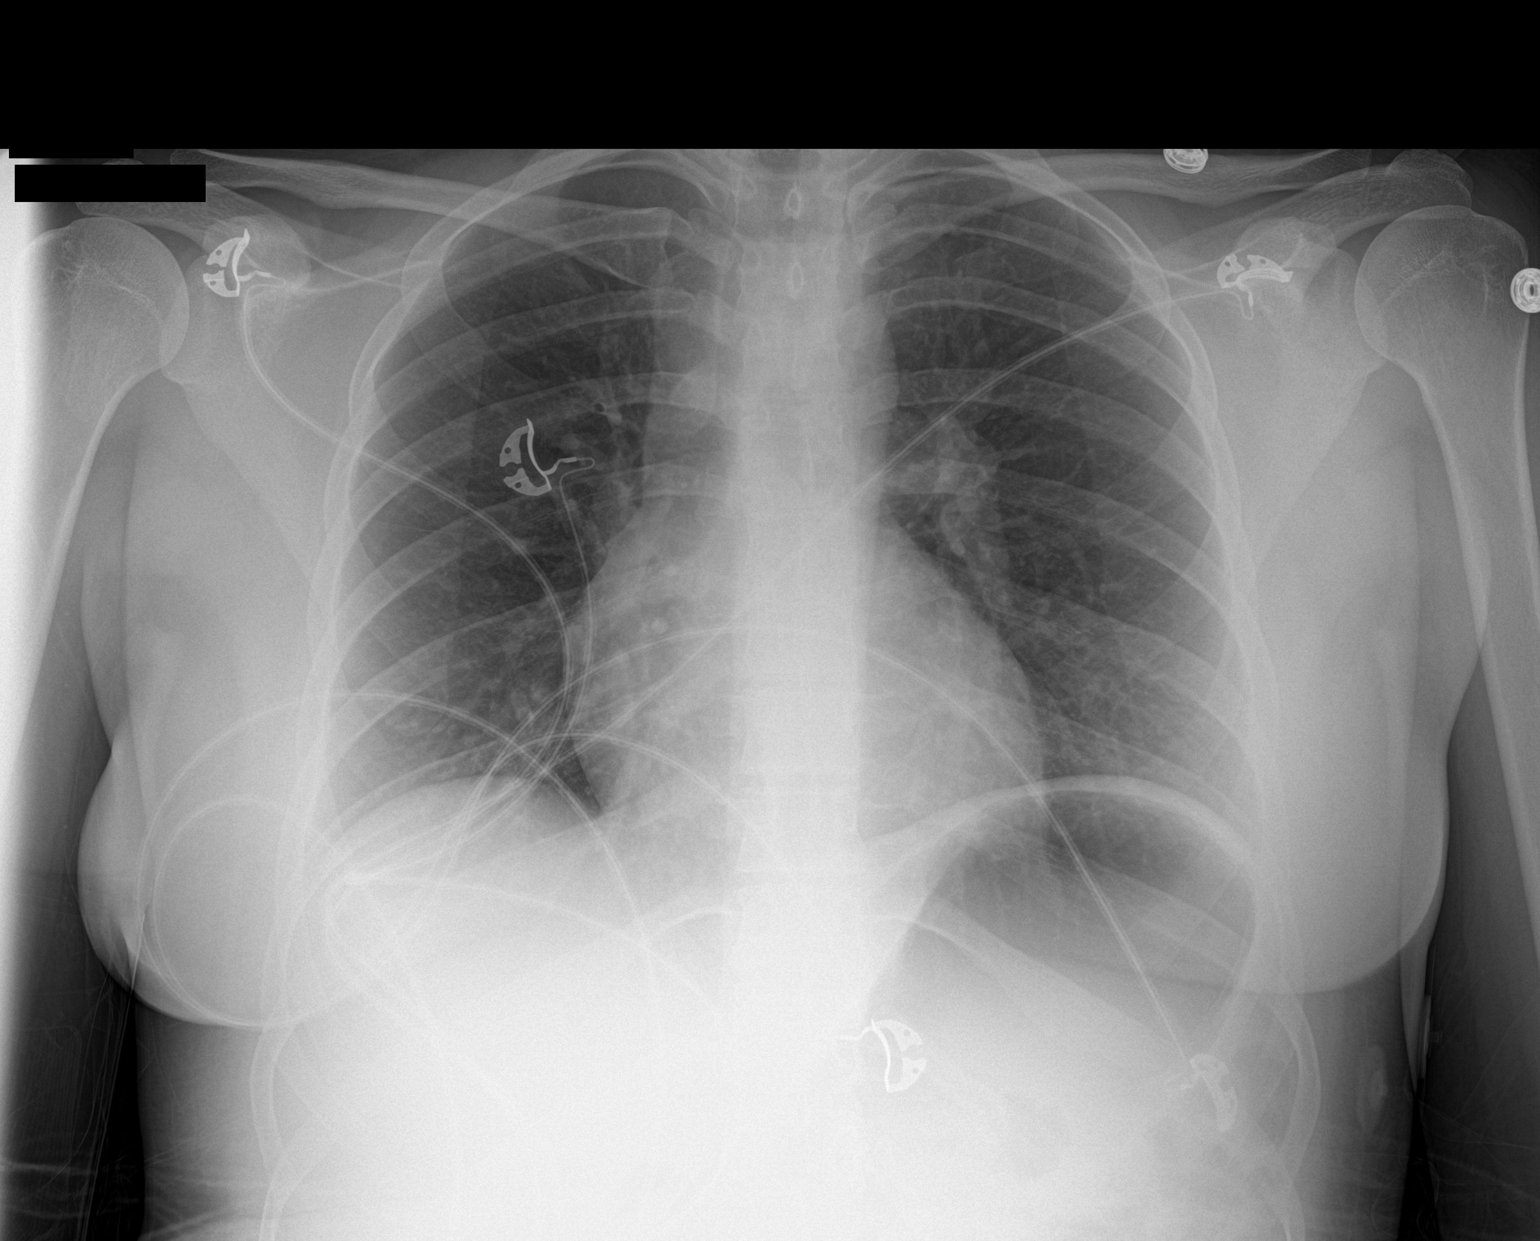

[1 of 1 positions shown; findings below may reference images not displayed]

FINDINGS: Lungs are clear. Heart size and pulmonary vascularity are normal. No
adenopathy. No bone lesions.
IMPRESSION: No edema or consolidation.

## 2018-05-28 ENCOUNTER — Encounter: Payer: Medicaid Other | Admitting: Obstetrics and Gynecology

## 2018-05-28 ENCOUNTER — Other Ambulatory Visit: Payer: Medicaid Other

## 2018-05-29 ENCOUNTER — Ambulatory Visit (INDEPENDENT_AMBULATORY_CARE_PROVIDER_SITE_OTHER): Payer: Medicaid Other | Admitting: Obstetrics and Gynecology

## 2018-05-29 ENCOUNTER — Other Ambulatory Visit: Payer: Medicaid Other

## 2018-05-29 VITALS — BP 124/82 | HR 83 | Wt 194.0 lb

## 2018-05-29 DIAGNOSIS — E119 Type 2 diabetes mellitus without complications: Secondary | ICD-10-CM

## 2018-05-29 DIAGNOSIS — Z3A32 32 weeks gestation of pregnancy: Secondary | ICD-10-CM

## 2018-05-29 DIAGNOSIS — IMO0001 Reserved for inherently not codable concepts without codable children: Secondary | ICD-10-CM

## 2018-05-29 DIAGNOSIS — O24113 Pre-existing diabetes mellitus, type 2, in pregnancy, third trimester: Secondary | ICD-10-CM

## 2018-05-29 DIAGNOSIS — Z794 Long term (current) use of insulin: Secondary | ICD-10-CM | POA: Diagnosis not present

## 2018-05-29 DIAGNOSIS — O0993 Supervision of high risk pregnancy, unspecified, third trimester: Secondary | ICD-10-CM

## 2018-05-29 LAB — POCT URINALYSIS DIPSTICK
Bilirubin, UA: NEGATIVE
Glucose, UA: NEGATIVE
KETONES UA: NEGATIVE
Leukocytes, UA: NEGATIVE
NITRITE UA: NEGATIVE
Protein, UA: NEGATIVE
RBC UA: NEGATIVE
SPEC GRAV UA: 1.01 (ref 1.010–1.025)
Urobilinogen, UA: 0.2 E.U./dL
pH, UA: 7.5 (ref 5.0–8.0)

## 2018-05-29 NOTE — Progress Notes (Signed)
Pt states she believes she is getting hemorrhoids. Otherwise she has no concerns.

## 2018-05-29 NOTE — Addendum Note (Signed)
Addended by: Girard CooterSMITH, Tylan M on: 05/29/2018 10:40 AM   Modules accepted: Orders

## 2018-05-29 NOTE — Progress Notes (Signed)
ROB: Sugars slightly improved but preprandial supper shows elevations sometimes up to 200.  Have recommended increasing long-acting insulin in the morning.  Discussed ultrasound findings of increased body size of baby.  Need for better glycemic control stressed. NONSTRESS TEST INTERPRETATION  INDICATIONS: IDDM diabetes mellitus  RESULTS:  reactive COMMENTS:   PLAN: 1. Continue fetal kick counts as directed. 2. Continue antepartum testing as scheduled.  Elonda Huskyavid J. Litha Lamartina, M.D. 05/29/2018 10:17 AM

## 2018-06-05 ENCOUNTER — Ambulatory Visit (INDEPENDENT_AMBULATORY_CARE_PROVIDER_SITE_OTHER): Payer: Medicaid Other | Admitting: Obstetrics and Gynecology

## 2018-06-05 ENCOUNTER — Other Ambulatory Visit: Payer: Medicaid Other

## 2018-06-05 VITALS — BP 133/81 | HR 92 | Wt 194.0 lb

## 2018-06-05 DIAGNOSIS — Z3A33 33 weeks gestation of pregnancy: Secondary | ICD-10-CM | POA: Diagnosis not present

## 2018-06-05 DIAGNOSIS — Z794 Long term (current) use of insulin: Secondary | ICD-10-CM | POA: Diagnosis not present

## 2018-06-05 DIAGNOSIS — E109 Type 1 diabetes mellitus without complications: Secondary | ICD-10-CM

## 2018-06-05 DIAGNOSIS — O0993 Supervision of high risk pregnancy, unspecified, third trimester: Secondary | ICD-10-CM

## 2018-06-05 DIAGNOSIS — O24113 Pre-existing diabetes mellitus, type 2, in pregnancy, third trimester: Secondary | ICD-10-CM

## 2018-06-05 LAB — POCT URINALYSIS DIPSTICK
BILIRUBIN UA: NEGATIVE
Glucose, UA: NEGATIVE
Ketones, UA: NEGATIVE
LEUKOCYTES UA: NEGATIVE
Nitrite, UA: NEGATIVE
PH UA: 7.5 (ref 5.0–8.0)
PROTEIN UA: NEGATIVE
RBC UA: NEGATIVE
Spec Grav, UA: 1.01 (ref 1.010–1.025)
UROBILINOGEN UA: 0.2 U/dL

## 2018-06-05 NOTE — Progress Notes (Signed)
Pt states she felt like a UTI was starting, otherwise no concerns.

## 2018-06-05 NOTE — Progress Notes (Signed)
ROB: Patient states her endocrinologist changed her  long-acting insulin and she has not had a sugar over 150 in a week.    Plan for induction approximately 38 weeks based on moderately controlled diabetes on medication.  Cultures next visit.  NONSTRESS TEST INTERPRETATION  INDICATIONS: diabetes mellitus RESULTS:  reactive COMMENTS:   PLAN: 1. Continue fetal kick counts as directed. 2. Continue antepartum testing as scheduled.  Elonda Huskyavid J. Vanessa Kampf, M.D. 06/05/2018 9:25 AM

## 2018-06-12 ENCOUNTER — Ambulatory Visit (INDEPENDENT_AMBULATORY_CARE_PROVIDER_SITE_OTHER): Payer: Medicaid Other | Admitting: Obstetrics and Gynecology

## 2018-06-12 ENCOUNTER — Encounter: Payer: Self-pay | Admitting: Obstetrics and Gynecology

## 2018-06-12 ENCOUNTER — Other Ambulatory Visit: Payer: Medicaid Other

## 2018-06-12 VITALS — BP 121/80 | HR 81 | Wt 192.0 lb

## 2018-06-12 DIAGNOSIS — O0993 Supervision of high risk pregnancy, unspecified, third trimester: Secondary | ICD-10-CM

## 2018-06-12 DIAGNOSIS — O24013 Pre-existing diabetes mellitus, type 1, in pregnancy, third trimester: Secondary | ICD-10-CM

## 2018-06-12 DIAGNOSIS — Z202 Contact with and (suspected) exposure to infections with a predominantly sexual mode of transmission: Secondary | ICD-10-CM

## 2018-06-12 DIAGNOSIS — Z3685 Encounter for antenatal screening for Streptococcus B: Secondary | ICD-10-CM

## 2018-06-12 DIAGNOSIS — Z3A35 35 weeks gestation of pregnancy: Secondary | ICD-10-CM | POA: Diagnosis not present

## 2018-06-12 LAB — POCT URINALYSIS DIPSTICK
Bilirubin, UA: NEGATIVE
GLUCOSE UA: NEGATIVE
Ketones, UA: NEGATIVE
LEUKOCYTES UA: NEGATIVE
Nitrite, UA: NEGATIVE
PH UA: 7.5 (ref 5.0–8.0)
Protein, UA: NEGATIVE
RBC UA: NEGATIVE
Spec Grav, UA: 1.015 (ref 1.010–1.025)
Urobilinogen, UA: 0.2 E.U./dL

## 2018-06-12 NOTE — Progress Notes (Signed)
Pt states she has no concerns at this time and sugar levels are still holding under 150, with the exception of yesterday when it was 178.

## 2018-06-12 NOTE — Progress Notes (Signed)
ROB:  Pt states sugers generally under 150 (pre-prandial)  with fastings in the 80s.  No complaints. GBS GC/CT done today. NONSTRESS TEST INTERPRETATION  INDICATIONS: diabetes mellitus FHR baseline: 130  RESULTS:  reactive COMMENTS:   PLAN: 1. Continue fetal kick counts as directed. 2. Continue antepartum testing as scheduled.  Elonda Huskyavid J. Keajah Killough, M.D. 06/12/2018 11:03 AM

## 2018-06-14 LAB — GC/CHLAMYDIA PROBE AMP
Chlamydia trachomatis, NAA: NEGATIVE
Neisseria gonorrhoeae by PCR: NEGATIVE

## 2018-06-14 LAB — STREP GP B NAA: STREP GROUP B AG: NEGATIVE

## 2018-06-19 ENCOUNTER — Other Ambulatory Visit: Payer: Medicaid Other

## 2018-06-19 ENCOUNTER — Ambulatory Visit (INDEPENDENT_AMBULATORY_CARE_PROVIDER_SITE_OTHER): Payer: Medicaid Other | Admitting: Obstetrics and Gynecology

## 2018-06-19 VITALS — BP 125/78 | HR 80 | Wt 195.1 lb

## 2018-06-19 DIAGNOSIS — Z3A36 36 weeks gestation of pregnancy: Secondary | ICD-10-CM | POA: Diagnosis not present

## 2018-06-19 DIAGNOSIS — O163 Unspecified maternal hypertension, third trimester: Secondary | ICD-10-CM

## 2018-06-19 DIAGNOSIS — O24013 Pre-existing diabetes mellitus, type 1, in pregnancy, third trimester: Secondary | ICD-10-CM | POA: Diagnosis not present

## 2018-06-19 DIAGNOSIS — O0993 Supervision of high risk pregnancy, unspecified, third trimester: Secondary | ICD-10-CM

## 2018-06-19 LAB — POCT URINALYSIS DIPSTICK OB
GLUCOSE, UA: NEGATIVE — AB
Ketones, UA: NEGATIVE
LEUKOCYTES UA: NEGATIVE
Nitrite, UA: NEGATIVE
POC,PROTEIN,UA: NEGATIVE
RBC UA: NEGATIVE
Spec Grav, UA: <=1.005 — AB (ref 1.010–1.025)
Urobilinogen, UA: 0.2 E.U./dL
pH, UA: 7 (ref 5.0–8.0)

## 2018-06-19 NOTE — Progress Notes (Signed)
ROB- PT stated that she is doing well. No complaints.   

## 2018-06-19 NOTE — Patient Instructions (Signed)
Labor Induction Labor induction is when steps are taken to cause a pregnant woman to begin the labor process. Most women go into labor on their own between 37 weeks and 42 weeks of the pregnancy. When this does not happen or when there is a medical need, methods may be used to induce labor. Labor induction causes a pregnant woman's uterus to contract. It also causes the cervix to soften (ripen), open (dilate), and thin out (efface). Usually, labor is not induced before 39 weeks of the pregnancy unless there is a problem with the baby or mother. Before inducing labor, your health care provider will consider a number of factors, including the following:  The medical condition of you and the baby.  How many weeks along you are.  The status of the baby's lung maturity.  The condition of the cervix.  The position of the baby.  What are the reasons for labor induction? Labor may be induced for the following reasons:  The health of the baby or mother is at risk.  The pregnancy is overdue by 1 week or more.  The water breaks but labor does not start on its own.  The mother has a health condition or serious illness, such as high blood pressure, infection, placental abruption, or diabetes.  The amniotic fluid amounts are low around the baby.  The baby is distressed.  Convenience or wanting the baby to be born on a certain date is not a reason for inducing labor. What methods are used for labor induction? Several methods of labor induction may be used, such as:  Prostaglandin medicine. This medicine causes the cervix to dilate and ripen. The medicine will also start contractions. It can be taken by mouth or by inserting a suppository into the vagina.  Inserting a thin tube (catheter) with a balloon on the end into the vagina to dilate the cervix. Once inserted, the balloon is expanded with water, which causes the cervix to open.  Stripping the membranes. Your health care provider separates  amniotic sac tissue from the cervix, causing the cervix to be stretched and causing the release of a hormone called progesterone. This may cause the uterus to contract. It is often done during an office visit. You will be sent home to wait for the contractions to begin. You will then come in for an induction.  Breaking the water. Your health care provider makes a hole in the amniotic sac using a small instrument. Once the amniotic sac breaks, contractions should begin. This may still take hours to see an effect.  Medicine to trigger or strengthen contractions. This medicine is given through an IV access tube inserted into a vein in your arm.  All of the methods of induction, besides stripping the membranes, will be done in the hospital. Induction is done in the hospital so that you and the baby can be carefully monitored. How long does it take for labor to be induced? Some inductions can take up to 2-3 days. Depending on the cervix, it usually takes less time. It takes longer when you are induced early in the pregnancy or if this is your first pregnancy. If a mother is still pregnant and the induction has been going on for 2-3 days, either the mother will be sent home or a cesarean delivery will be needed. What are the risks associated with labor induction? Some of the risks of induction include:  Changes in fetal heart rate, such as too high, too low, or erratic.    Fetal distress.  Chance of infection for the mother and baby.  Increased chance of having a cesarean delivery.  Breaking off (abruption) of the placenta from the uterus (rare).  Uterine rupture (very rare).  When induction is needed for medical reasons, the benefits of induction may outweigh the risks. What are some reasons for not inducing labor? Labor induction should not be done if:  It is shown that your baby does not tolerate labor.  You have had previous surgeries on your uterus, such as a myomectomy or the removal of  fibroids.  Your placenta lies very low in the uterus and blocks the opening of the cervix (placenta previa).  Your baby is not in a head-down position.  The umbilical cord drops down into the birth canal in front of the baby. This could cut off the baby's blood and oxygen supply.  You have had a previous cesarean delivery.  There are unusual circumstances, such as the baby being extremely premature.  This information is not intended to replace advice given to you by your health care provider. Make sure you discuss any questions you have with your health care provider. Document Released: 03/07/2007 Document Revised: 03/23/2016 Document Reviewed: 05/15/2013 Elsevier Interactive Patient Education  2017 Elsevier Inc.   Pain Relief During Labor and Delivery Many things can cause pain during labor and delivery, including:  Pressure on bones and ligaments due to the baby moving through the pelvis.  Stretching of tissues due to the baby moving through the birth canal.  Muscle tension due to anxiety or nervousness.  The uterus tightening (contracting) and relaxing to help move the baby.  There are many ways to deal with the pain of labor and delivery. They include:  Taking prenatal classes. Taking these classes helps you know what to expect during your baby's birth. What you learn will increase your confidence and decrease your anxiety.  Practicing relaxation techniques or doing relaxing activities, such as: ? Focused breathing. ? Meditation. ? Visualization. ? Aroma therapy. ? Listening to your favorite music. ? Hypnosis.  Taking a warm shower or bath (hydrotherapy). This may: ? Provide comfort and relaxation. ? Lessen your perception of pain. ? Decrease the amount of pain medicine needed. ? Decrease the length of labor.  Getting a massage or counterpressure on your back.  Applying warm packs or ice packs.  Changing positions often, moving around, or using a birthing  ball.  Getting: ? Pain medicine through an IV or injection into a muscle. ? Pain medicine inserted into your spinal column. ? Injections of sterile water just under the skin on your lower back (intradermal injections). ? Laughing gas (nitrous oxide).  Discuss your pain control options with your health care provider during your prenatal visits. Explore the options offered by your hospital or birth center. What kinds of medicine are available? There are two kinds of medicines that can be used to relieve pain during labor and delivery:  Analgesics. These medicines decrease pain without causing you to lose feeling or the ability to move your muscles.  Anesthetics. These medicines block feeling in the body and can decrease your ability to move freely.  Both of these kinds of medicine can cause minor side effects, such as nausea, trouble concentrating, and sleepiness. They can also decrease the baby's heart rate before birth and affect the baby's breathing rate after birth. For this reason, health care providers are careful about when and how much medicine is given. What are specific medicines and procedures that  provide pain relief? Local Anesthetics Local anesthetics are used to numb a small area of the body. They may be used along with another kind of anesthetic or used to numb the nerves of the vagina, cervix, and perineum during the second stage of labor. General Anesthetics General anesthetics cause you to lose consciousness so you do not feel pain. They are usually only used for an emergency cesarean delivery. General anesthetics are given through an IV tube and a mask. Pudendal Block A pudendal block is a form of local anesthetic. It may be used to relieve the pain associated with pushing or stretching of the perineum at the time of delivery or to further numb the perineum. A pudendal block is done by injecting numbing medicine through the vaginal wall into a nerve in the pelvis. Epidural  Analgesia Epidural analgesia is given through a flexible IV catheter that is inserted into the lower back. Numbing medicine is delivered continuously to the area near your spinal column nerves (epidural space). After having this type of analgesia, you may be able to move your legs but you most likely will not be able to walk. Depending on the amount of medicine given, you may lose all feeling in the lower half of your body, or you may retain some level of sensation, including the urge to push. Epidural analgesia can be used to provide pain relief for a vaginal birth. Spinal Block A spinal block is similar to epidural analgesia, but the medicine is injected into the spinal fluid instead of the epidural space. A spinal block is only given once. It starts to relieve pain quickly, but the pain relief lasts only 1-6 hours. Spinal blocks can be used for cesarean deliveries. Combined Spinal-Epidural (CSE) Block A CSE block combines the effects of a spinal block and epidural analgesia. The spinal block works quickly to block all pain. The epidural analgesia provides continuous pain relief, even after the effects of the spinal block have worn off. This information is not intended to replace advice given to you by your health care provider. Make sure you discuss any questions you have with your health care provider. Document Released: 02/01/2009 Document Revised: 03/24/2016 Document Reviewed: 03/08/2016 Elsevier Interactive Patient Education  2018 [employer].    Fetal Movement Counts Patient Name: ________________________________________________ Patient Due Date: ____________________ What is a fetal movement count? A fetal movement count is the number of times that you feel your baby move during a certain amount of time. This may also be called a fetal kick count. A fetal movement count is recommended for every pregnant woman. You may be asked to start counting fetal movements as early as week 28 of your  pregnancy. Pay attention to when your baby is most active. You may notice your baby's sleep and wake cycles. You may also notice things that make your baby move more. You should do a fetal movement count: When your baby is normally most active. At the same time each day.  A good time to count movements is while you are resting, after having something to eat and drink. How do I count fetal movements? Find a quiet, comfortable area. Sit, or lie down on your side. Write down the date, the start time and stop time, and the number of movements that you felt between those two times. Take this information with you to your health care visits. For 2 hours, count kicks, flutters, swishes, rolls, and jabs. You should feel at least 10 movements during 2 hours. You  may stop counting after you have felt 10 movements. If you do not feel 10 movements in 2 hours, have something to eat and drink. Then, keep resting and counting for 1 hour. If you feel at least 4 movements during that hour, you may stop counting. Contact a health care provider if: You feel fewer than 4 movements in 2 hours. Your baby is not moving like he or she usually does. Date: ____________ Start time: ____________ Stop time: ____________ Movements: ____________ Date: ____________ Start time: ____________ Stop time: ____________ Movements: ____________ Date: ____________ Start time: ____________ Stop time: ____________ Movements: ____________ Date: ____________ Start time: ____________ Stop time: ____________ Movements: ____________ Date: ____________ Start time: ____________ Stop time: ____________ Movements: ____________ Date: ____________ Start time: ____________ Stop time: ____________ Movements: ____________ Date: ____________ Start time: ____________ Stop time: ____________ Movements: ____________ Date: ____________ Start time: ____________ Stop time: ____________ Movements: ____________ Date: ____________ Start time: ____________ Stop  time: ____________ Movements: ____________ This information is not intended to replace advice given to you by your health care provider. Make sure you discuss any questions you have with your health care provider. Document Released: 11/15/2006 Document Revised: 06/14/2016 Document Reviewed: 11/25/2015 Elsevier Interactive Patient Education  [employer]2018 Else[employer] Inc.

## 2018-06-19 NOTE — Progress Notes (Signed)
ROB: Notes blood sugars are doing well, running 70s for fasting, 90s preprandial. Continue to f/u with Endocrinology [facility] (was supposed to be scheduled for echo due to Type 1 DM, will also need growth scan at this time). Negative 3rd trimester cultures. Notes decreased FM today, has not really felt baby move since 11:00 a.m. NST performed today was reviewed and was found to be reactive (after vibroacoustic stimulation).  Continue recommended antenatal testing and prenatal care. Encouraged fetal kick counts. Discussed process of labor induction with patient, including use of medications, pain management during labor.  BPs elevated on today's NST, will also order baseline PIH labs. Continue plans for delivery at 38 weeks, if not sooner depending on BPs and labs.    NONSTRESS TEST INTERPRETATION  INDICATIONS: Type 1 DM on insulin  FHR baseline: 135 bpm RESULTS:Reactive COMMENTS: BPs 140/89, 123/85, 143/83, 133/93, 144/95.    PLAN: 1. Continue fetal kick counts twice a day. 2. Continue antepartum testing as scheduled-Biweekly

## 2018-06-20 ENCOUNTER — Other Ambulatory Visit: Payer: Self-pay | Admitting: Maternal and Fetal Medicine

## 2018-06-20 ENCOUNTER — Ambulatory Visit (HOSPITAL_BASED_OUTPATIENT_CLINIC_OR_DEPARTMENT_OTHER)
Admission: RE | Admit: 2018-06-20 | Discharge: 2018-06-20 | Disposition: A | Discharge status: Home / Self Care | Payer: BC Managed Care – PPO | Source: Ambulatory Visit | Attending: Maternal and Fetal Medicine | Admitting: Maternal and Fetal Medicine

## 2018-06-20 ENCOUNTER — Other Ambulatory Visit: Payer: Self-pay

## 2018-06-20 ENCOUNTER — Observation Stay
Admission: AD | Admit: 2018-06-20 | Discharge: 2018-06-20 | Disposition: A | Discharge status: Home / Self Care | Payer: BC Managed Care – PPO | Attending: Obstetrics and Gynecology | Admitting: Obstetrics and Gynecology

## 2018-06-20 VITALS — BP 152/101 | HR 82 | Temp 98.3°F | Resp 18 | Ht 66.0 in | Wt 194.4 lb

## 2018-06-20 DIAGNOSIS — O163 Unspecified maternal hypertension, third trimester: Secondary | ICD-10-CM

## 2018-06-20 DIAGNOSIS — Z79899 Other long term (current) drug therapy: Secondary | ICD-10-CM | POA: Diagnosis not present

## 2018-06-20 DIAGNOSIS — E109 Type 1 diabetes mellitus without complications: Secondary | ICD-10-CM | POA: Diagnosis not present

## 2018-06-20 DIAGNOSIS — Z3A36 36 weeks gestation of pregnancy: Secondary | ICD-10-CM

## 2018-06-20 DIAGNOSIS — O133 Gestational [pregnancy-induced] hypertension without significant proteinuria, third trimester: Secondary | ICD-10-CM | POA: Diagnosis not present

## 2018-06-20 DIAGNOSIS — O24013 Pre-existing diabetes mellitus, type 1, in pregnancy, third trimester: Secondary | ICD-10-CM | POA: Diagnosis not present

## 2018-06-20 DIAGNOSIS — Z794 Long term (current) use of insulin: Secondary | ICD-10-CM | POA: Insufficient documentation

## 2018-06-20 DIAGNOSIS — O139 Gestational [pregnancy-induced] hypertension without significant proteinuria, unspecified trimester: Secondary | ICD-10-CM

## 2018-06-20 LAB — PROTEIN / CREATININE RATIO, URINE
CREATININE, UR: 73.8 mg/dL
Creatinine, Urine: 164 mg/dL
PROTEIN UR: 13.9 mg/dL
PROTEIN/CREAT RATIO: 188 mg/g{creat} (ref 0–200)
Protein Creatinine Ratio: 0.13 mg/mg{Cre} (ref 0.00–0.15)
TOTAL PROTEIN, URINE: 21 mg/dL

## 2018-06-20 LAB — CBC
Hematocrit: 32.9 % — ABNORMAL LOW (ref 34.0–46.6)
Hemoglobin: 10.7 g/dL — ABNORMAL LOW (ref 11.1–15.9)
MCH: 28.9 pg (ref 26.6–33.0)
MCHC: 32.5 g/dL (ref 31.5–35.7)
MCV: 89 fL (ref 79–97)
PLATELETS: 214 10*3/uL (ref 150–450)
RBC: 3.7 x10E6/uL — ABNORMAL LOW (ref 3.77–5.28)
RDW: 13.5 % (ref 12.3–15.4)
WBC: 6.9 10*3/uL (ref 3.4–10.8)

## 2018-06-20 LAB — COMPREHENSIVE METABOLIC PANEL
ALK PHOS: 169 IU/L — AB (ref 39–117)
ALT: 12 IU/L (ref 0–32)
AST: 14 IU/L (ref 0–40)
Albumin/Globulin Ratio: 1.4 (ref 1.2–2.2)
Albumin: 3.3 g/dL — ABNORMAL LOW (ref 3.5–5.5)
BUN/Creatinine Ratio: 13 (ref 9–23)
BUN: 6 mg/dL (ref 6–20)
Bilirubin Total: 0.3 mg/dL (ref 0.0–1.2)
CO2: 18 mmol/L — AB (ref 20–29)
CREATININE: 0.47 mg/dL — AB (ref 0.57–1.00)
Calcium: 8.7 mg/dL (ref 8.7–10.2)
Chloride: 107 mmol/L — ABNORMAL HIGH (ref 96–106)
GFR calc Af Amer: 160 mL/min/{1.73_m2} (ref >59)
GFR calc non Af Amer: 139 mL/min/{1.73_m2} (ref >59)
GLOBULIN, TOTAL: 2.3 g/dL (ref 1.5–4.5)
GLUCOSE: 98 mg/dL (ref 65–99)
Potassium: 4.2 mmol/L (ref 3.5–5.2)
SODIUM: 139 mmol/L (ref 134–144)
Total Protein: 5.6 g/dL — ABNORMAL LOW (ref 6.0–8.5)

## 2018-06-20 MED ORDER — LABETALOL HCL 5 MG/ML IV SOLN: 20.0000 mg | INTRAVENOUS | Status: DC | PRN

## 2018-06-20 MED ORDER — LABETALOL HCL 5 MG/ML IV SOLN: 80.0000 mg | INTRAVENOUS | Status: DC | PRN

## 2018-06-20 MED ORDER — LABETALOL HCL 5 MG/ML IV SOLN: 40.0000 mg | INTRAVENOUS | Status: DC | PRN

## 2018-06-20 MED ORDER — HYDRALAZINE HCL 20 MG/ML IJ SOLN: 10.0000 mg | INTRAMUSCULAR | Status: DC | PRN

## 2018-06-20 NOTE — OB Triage Note (Signed)
[facility].  Patient oriented to room, monitors applied and assesessing.  Patient accompanied by her father.  No other complaints.

## 2018-06-20 NOTE — ED Notes (Signed)
Patient taken to labor and delivery for further evaluation per Dr. Kizzie BaneHughes order.

## 2018-06-20 NOTE — Final Progress Note (Signed)
L&D OB Triage Note  [name] is a 22 y.o. G1P0 female at 2955w5d, EDD Estimated Date of Delivery: 07/13/18 with h/o Type 1 DM who [facility] for elevated blood pressures (152/101).  She was also noted to have elevated blood pressures yesterday at her routine OB visit. She currently denies headaches, RUQ pain, vision changes She was evaluated with no significant findings for pre-eclampsia. Vital signs stable. An NST was performed and has been reviewed by MD.   Vitals:   06/20/18 1235 06/20/18 1250 06/20/18 1305 06/20/18 1320  BP: 124/79 132/84 125/85 132/81  Pulse: 91 75 75 77  Resp:        NST INTERPRETATION: Indications: pregnancy-induced hypertension and diabetes mellitus  Mode: External Baseline Rate (A): 135 bpm Variability: Moderate Accelerations: 15 x 15 Decelerations: None     Contraction Frequency (min): None  Impression: reactive   Plan: NST performed was reviewed and was found to be reactive. Blood pressures were within normal range in triage.  Likely with GHTN. She was discharged home with PIH precautions.  Continue routine prenatal care. Follow up with OB/GYN as previously scheduled. Induction of labor scheduled for after 37 weeks (06/26/2018).    Hildred Laserherry, Daksh Coates, MD [facility]

## 2018-06-20 NOTE — Progress Notes (Signed)
[facility] Consultation   Chief Complaint: T1DM  HPI: [name] is a 22 y.o. G1P0 at 536w5d by first trimester ultrasound who presents in consultation from  for T1DM after transfer of [place] from [place].  Ms [name] was diagnosed with T1DM at age 22 when she had DKA. She has been on insulin since that time. HEr last A1c was 6.8 and it was as low as 5 early in pregnancy. She only checks fasting and pre-meal values and these are 70s-80 for FBS and 90s for pre-meals. She takes levemir 47u hs and novolog carb counting with meals. She has has one other episode of DKA after a GI bug and was told she had acute kidney injury then. However Care everywhere values are all wnl for CR and microalbumin. She denies cardiac disease. She reports that she has cataracts in her eyes but denies retinopathy.   She has had mild range hypertension for the past 2 days but denies headache, scotomata and RUQ pain. She reports normal fetal movement.    Past Medical History: Patient  has a past medical history of Diabetes mellitus without complication (HCC), DKA (diabetic ketoacidoses) (HCC) (02/25/2017), Generalized anxiety disorder (01/31/2018), and Heart murmur.  Past Surgical History: She  has a past surgical history that includes No past surgeries.  Obstetric History:  OB History    Gravida  1   Para      Term      Preterm      AB      Living        SAB      TAB      Ectopic      Multiple      Live Births              Medications: She also takes PNV Allergies: Patient is allergic to sulfamethoxazole-trimethoprim and pravastatin.  Social History: Patient  reports that she has never smoked. She has never used smokeless tobacco. She reports that she drank alcohol. She reports that she does not use drugs.  Family History: family history includes Diabetes in her father.    Physical Exam: BP (!) 152/101 Comment: rechecked , md b.[name] notified of bp  Pulse 82   Temp 98.3  F (36.8 C) (Oral)   Resp 18   Ht 5\' 6"  (1.676 m)   Wt 88.2 kg   LMP 10/06/2017   SpO2 97%   BMI 31.38 kg/m  146/91  Asessement: 1. Type 1 diabetes mellitus without complication (HCC)   2. Gestational hypertension, third trimester   3. Elevated blood pressure affecting pregnancy in third trimester, antepartum     Plan: 1.T1DM: continue current regimen of insulin doing well. Advised that we would like her to also check 2-hour postprandials so that we may adjust her carb ratios with her meals. Patient reports that she had a scan with [place] in [place] but denies having had a fetal echo. Scans were not scheduled for today but a growth scan would be useful to evaluate for macrosomia. Given late gestational age, postnatal echo should be considered. Goal blood sugars for remainder of pregnancy are FBS <95 and 2hour PP <120. FBS currently within goal. Patient reports that her last growth scan was within normal limits as well.  At the time of delivery recommend insulin gtt to maintain blood sugar 80-120 with D5 infusion to provide substrate.  2. HTN today in the office and yesterday at NST with labs without evidence of preeclampsia  on lab evaluation. Today she is asymptomatic but had diastolic >100. Discussed with Dr Valentino Saxon who would like her to have further evaluation for hypertension on labor and delivery today. If severe range BPs today (>160 or 110) would recommend delivery today. If continues to be mild range, recommend delivery at [redacted]w[redacted]d which is Saturday.  Total time spent with the patient was 40 minutes with greater than 50% spent in counseling and coordination of care. We appreciate this interesting consult and will be happy to be involved in the ongoing care of [name] in anyway her obstetricians desire.  Artemio Aly MD Maternal-Fetal Medicine [facility]

## 2018-06-24 ENCOUNTER — Other Ambulatory Visit: Payer: Medicaid Other

## 2018-06-24 ENCOUNTER — Other Ambulatory Visit (INDEPENDENT_AMBULATORY_CARE_PROVIDER_SITE_OTHER): Payer: Medicaid Other

## 2018-06-24 ENCOUNTER — Encounter: Payer: Self-pay | Admitting: Obstetrics and Gynecology

## 2018-06-24 ENCOUNTER — Ambulatory Visit (INDEPENDENT_AMBULATORY_CARE_PROVIDER_SITE_OTHER): Payer: Medicaid Other | Admitting: Obstetrics and Gynecology

## 2018-06-24 VITALS — BP 127/86 | HR 98 | Wt 195.8 lb

## 2018-06-24 DIAGNOSIS — O0993 Supervision of high risk pregnancy, unspecified, third trimester: Secondary | ICD-10-CM | POA: Diagnosis not present

## 2018-06-24 DIAGNOSIS — O133 Gestational [pregnancy-induced] hypertension without significant proteinuria, third trimester: Secondary | ICD-10-CM

## 2018-06-24 DIAGNOSIS — Z3A37 37 weeks gestation of pregnancy: Secondary | ICD-10-CM | POA: Diagnosis not present

## 2018-06-24 DIAGNOSIS — O24313 Unspecified pre-existing diabetes mellitus in pregnancy, third trimester: Secondary | ICD-10-CM | POA: Diagnosis not present

## 2018-06-24 LAB — POCT URINALYSIS DIPSTICK OB
Bilirubin, UA: NEGATIVE
GLUCOSE, UA: NEGATIVE — AB
KETONES UA: NEGATIVE
LEUKOCYTES UA: NEGATIVE
Nitrite, UA: NEGATIVE
POC,PROTEIN,UA: NEGATIVE
RBC UA: NEGATIVE
SPEC GRAV UA: 1.01 (ref 1.010–1.025)
Urobilinogen, UA: 0.2 E.U./dL
pH, UA: 7.5 (ref 5.0–8.0)

## 2018-06-24 NOTE — Progress Notes (Signed)
ROB-pt is present today for a NST. Pt stated that she is doing well.

## 2018-06-24 NOTE — Progress Notes (Signed)
NST performed today was reviewed and was found to be reactive.  Continue recommended antenatal testing and prenatal care.  For growth scan for Type 1 DM in pregnancy and GHTN.  Scheduled for IOL on this Wednesday.     NONSTRESS TEST INTERPRETATION  INDICATIONS: Gestational HTN, Type 1 DM in pregnancy  FHR baseline: 130 bpm RESULTS:Reactive COMMENTS: BPs 139/89, 137/90, 133/93, 153/92, 153/97. 1 shallow variable present   PLAN: 1. Continue fetal kick counts twice a day. 2. Scheduled for IOL in 2 days.

## 2018-06-25 ENCOUNTER — Inpatient Hospital Stay
Admission: EM | Admit: 2018-06-25 | Discharge: 2018-06-29 | DRG: 806 | Disposition: A | Discharge status: Home / Self Care | Payer: BC Managed Care – PPO | Attending: Obstetrics and Gynecology | Admitting: Obstetrics and Gynecology

## 2018-06-25 DIAGNOSIS — O9902 Anemia complicating childbirth: Secondary | ICD-10-CM | POA: Diagnosis present

## 2018-06-25 DIAGNOSIS — Z3A37 37 weeks gestation of pregnancy: Secondary | ICD-10-CM

## 2018-06-25 DIAGNOSIS — O2402 Pre-existing diabetes mellitus, type 1, in childbirth: Secondary | ICD-10-CM | POA: Diagnosis present

## 2018-06-25 DIAGNOSIS — K219 Gastro-esophageal reflux disease without esophagitis: Secondary | ICD-10-CM | POA: Diagnosis present

## 2018-06-25 DIAGNOSIS — E1065 Type 1 diabetes mellitus with hyperglycemia: Secondary | ICD-10-CM

## 2018-06-25 DIAGNOSIS — O9962 Diseases of the digestive system complicating childbirth: Secondary | ICD-10-CM | POA: Diagnosis present

## 2018-06-25 DIAGNOSIS — Z794 Long term (current) use of insulin: Secondary | ICD-10-CM

## 2018-06-25 DIAGNOSIS — O9912 Other diseases of the blood and blood-forming organs and certain disorders involving the immune mechanism complicating childbirth: Secondary | ICD-10-CM | POA: Diagnosis present

## 2018-06-25 DIAGNOSIS — D649 Anemia, unspecified: Secondary | ICD-10-CM | POA: Diagnosis present

## 2018-06-25 DIAGNOSIS — O2432 Unspecified pre-existing diabetes mellitus in childbirth: Secondary | ICD-10-CM | POA: Diagnosis present

## 2018-06-25 DIAGNOSIS — O134 Gestational [pregnancy-induced] hypertension without significant proteinuria, complicating childbirth: Principal | ICD-10-CM | POA: Diagnosis present

## 2018-06-26 ENCOUNTER — Other Ambulatory Visit: Payer: Self-pay

## 2018-06-26 DIAGNOSIS — O9962 Diseases of the digestive system complicating childbirth: Secondary | ICD-10-CM | POA: Diagnosis present

## 2018-06-26 DIAGNOSIS — K219 Gastro-esophageal reflux disease without esophagitis: Secondary | ICD-10-CM | POA: Diagnosis present

## 2018-06-26 DIAGNOSIS — O2402 Pre-existing diabetes mellitus, type 1, in childbirth: Secondary | ICD-10-CM | POA: Diagnosis not present

## 2018-06-26 DIAGNOSIS — Z3A37 37 weeks gestation of pregnancy: Secondary | ICD-10-CM | POA: Diagnosis not present

## 2018-06-26 DIAGNOSIS — O9912 Other diseases of the blood and blood-forming organs and certain disorders involving the immune mechanism complicating childbirth: Secondary | ICD-10-CM | POA: Diagnosis present

## 2018-06-26 DIAGNOSIS — O134 Gestational [pregnancy-induced] hypertension without significant proteinuria, complicating childbirth: Secondary | ICD-10-CM | POA: Diagnosis present

## 2018-06-26 DIAGNOSIS — O2432 Unspecified pre-existing diabetes mellitus in childbirth: Secondary | ICD-10-CM | POA: Diagnosis present

## 2018-06-26 DIAGNOSIS — E1065 Type 1 diabetes mellitus with hyperglycemia: Secondary | ICD-10-CM | POA: Diagnosis not present

## 2018-06-26 DIAGNOSIS — O9902 Anemia complicating childbirth: Secondary | ICD-10-CM | POA: Diagnosis present

## 2018-06-26 DIAGNOSIS — Z794 Long term (current) use of insulin: Secondary | ICD-10-CM | POA: Diagnosis not present

## 2018-06-26 DIAGNOSIS — D649 Anemia, unspecified: Secondary | ICD-10-CM | POA: Diagnosis present

## 2018-06-26 LAB — CBC
HCT: 31.1 % — ABNORMAL LOW (ref 35.0–47.0)
Hemoglobin: 10.9 g/dL — ABNORMAL LOW (ref 12.0–16.0)
MCH: 30.8 pg (ref 26.0–34.0)
MCHC: 34.9 g/dL (ref 32.0–36.0)
MCV: 88 fL (ref 80.0–100.0)
Platelets: 190 K/uL (ref 150–440)
RBC: 3.54 MIL/uL — ABNORMAL LOW (ref 3.80–5.20)
RDW: 13.8 % (ref 11.5–14.5)
WBC: 8.8 K/uL (ref 3.6–11.0)

## 2018-06-26 LAB — GLUCOSE, CAPILLARY
GLUCOSE-CAPILLARY: 54 mg/dL — AB (ref 70–99)
GLUCOSE-CAPILLARY: 74 mg/dL (ref 70–99)
GLUCOSE-CAPILLARY: 155 mg/dL — AB (ref 70–99)
GLUCOSE-CAPILLARY: 150 mg/dL — AB (ref 70–99)
Glucose-Capillary: 100 mg/dL — ABNORMAL HIGH (ref 70–99)
Glucose-Capillary: 109 mg/dL — ABNORMAL HIGH (ref 70–99)
Glucose-Capillary: 118 mg/dL — ABNORMAL HIGH (ref 70–99)
Glucose-Capillary: 155 mg/dL — ABNORMAL HIGH (ref 70–99)
Glucose-Capillary: 62 mg/dL — ABNORMAL LOW (ref 70–99)
Glucose-Capillary: 74 mg/dL (ref 70–99)
Glucose-Capillary: 75 mg/dL (ref 70–99)
Glucose-Capillary: 76 mg/dL (ref 70–99)
Glucose-Capillary: 77 mg/dL (ref 70–99)
Glucose-Capillary: 79 mg/dL (ref 70–99)
Glucose-Capillary: 88 mg/dL (ref 70–99)

## 2018-06-26 LAB — TYPE AND SCREEN
ABO/RH(D): O POS
Antibody Screen: NEGATIVE

## 2018-06-26 LAB — RAPID HIV SCREEN (HIV 1/2 AB+AG)
HIV 1/2 ANTIBODIES: NONREACTIVE
HIV-1 P24 ANTIGEN - HIV24: NONREACTIVE

## 2018-06-26 MED ORDER — MISOPROSTOL 25 MCG QUARTER TABLET
25.0000 ug | ORAL_TABLET | ORAL | Status: DC
  Administered 2018-06-26 (×2): 25 ug via VAGINAL
  Filled 2018-06-26: qty 1

## 2018-06-26 MED ORDER — FAMOTIDINE 20 MG PO TABS
20.0000 mg | ORAL_TABLET | Freq: Two times a day (BID) | ORAL | Status: DC
  Administered 2018-06-26 – 2018-06-27 (×4): 20 mg via ORAL
  Filled 2018-06-26 (×5): qty 1

## 2018-06-26 MED ORDER — AMMONIA AROMATIC IN INHA
RESPIRATORY_TRACT | Status: AC
  Filled 2018-06-26: qty 10

## 2018-06-26 MED ORDER — BUTORPHANOL TARTRATE 1 MG/ML IJ SOLN
1.0000 mg | INTRAMUSCULAR | Status: DC | PRN
  Administered 2018-06-27 (×2): 1 mg via INTRAVENOUS
  Filled 2018-06-26 (×2): qty 1

## 2018-06-26 MED ORDER — MISOPROSTOL 50MCG HALF TABLET
ORAL_TABLET | ORAL | Status: AC
  Filled 2018-06-26: qty 1

## 2018-06-26 MED ORDER — ZOLPIDEM TARTRATE 5 MG PO TABS
5.0000 mg | ORAL_TABLET | Freq: Every evening | ORAL | Status: DC | PRN
  Administered 2018-06-26: 5 mg via ORAL
  Filled 2018-06-26: qty 1

## 2018-06-26 MED ORDER — LACTATED RINGERS IV SOLN
500.0000 mL | INTRAVENOUS | Status: DC | PRN
  Administered 2018-06-27: 500 mL via INTRAVENOUS

## 2018-06-26 MED ORDER — MISOPROSTOL 25 MCG QUARTER TABLET
ORAL_TABLET | ORAL | Status: AC
  Filled 2018-06-26: qty 2

## 2018-06-26 MED ORDER — MISOPROSTOL 25 MCG QUARTER TABLET
25.0000 ug | ORAL_TABLET | ORAL | Status: DC
  Administered 2018-06-26 (×2): 25 ug via BUCCAL
  Filled 2018-06-26: qty 1

## 2018-06-26 MED ORDER — ACETAMINOPHEN 325 MG PO TABS
650.0000 mg | ORAL_TABLET | ORAL | Status: DC | PRN
  Administered 2018-06-27: 650 mg via ORAL
  Filled 2018-06-26: qty 2

## 2018-06-26 MED ORDER — OXYCODONE-ACETAMINOPHEN 5-325 MG PO TABS: 2.0000 | ORAL_TABLET | ORAL | Status: DC | PRN

## 2018-06-26 MED ORDER — SOD CITRATE-CITRIC ACID 500-334 MG/5ML PO SOLN
30.0000 mL | ORAL | Status: DC | PRN
  Filled 2018-06-26: qty 15

## 2018-06-26 MED ORDER — INSULIN ASPART 100 UNIT/ML ~~LOC~~ SOLN
15.0000 [IU] | Freq: Once | SUBCUTANEOUS | Status: DC
  Filled 2018-06-26: qty 0.15

## 2018-06-26 MED ORDER — ONDANSETRON HCL 4 MG/2ML IJ SOLN
4.0000 mg | Freq: Four times a day (QID) | INTRAMUSCULAR | Status: DC | PRN
  Administered 2018-06-27 (×2): 4 mg via INTRAVENOUS
  Filled 2018-06-26 (×2): qty 2

## 2018-06-26 MED ORDER — OXYTOCIN 10 UNIT/ML IJ SOLN
INTRAMUSCULAR | Status: AC
  Filled 2018-06-26: qty 2

## 2018-06-26 MED ORDER — LIDOCAINE HCL (PF) 1 % IJ SOLN
INTRAMUSCULAR | Status: AC
  Filled 2018-06-26: qty 30

## 2018-06-26 MED ORDER — MISOPROSTOL 200 MCG PO TABS
ORAL_TABLET | ORAL | Status: AC
  Filled 2018-06-26: qty 4

## 2018-06-26 MED ORDER — INSULIN DETEMIR 100 UNIT/ML ~~LOC~~ SOLN
47.0000 [IU] | Freq: Every day | SUBCUTANEOUS | Status: DC
  Administered 2018-06-26 – 2018-06-28 (×2): 47 [IU] via SUBCUTANEOUS
  Filled 2018-06-26 (×3): qty 0.47

## 2018-06-26 MED ORDER — MISOPROSTOL 25 MCG QUARTER TABLET
50.0000 ug | ORAL_TABLET | ORAL | Status: DC
  Administered 2018-06-26 – 2018-06-27 (×4): 50 ug via VAGINAL
  Filled 2018-06-26 (×5): qty 1

## 2018-06-26 MED ORDER — OXYTOCIN 40 UNITS IN LACTATED RINGERS INFUSION - SIMPLE MED
2.5000 [IU]/h | INTRAVENOUS | Status: DC
  Filled 2018-06-26: qty 1000

## 2018-06-26 MED ORDER — TERBUTALINE SULFATE 1 MG/ML IJ SOLN: 0.2500 mg | Freq: Once | INTRAMUSCULAR | Status: DC | PRN

## 2018-06-26 MED ORDER — OXYTOCIN BOLUS FROM INFUSION: 500.0000 mL | Freq: Once | INTRAVENOUS | Status: DC

## 2018-06-26 MED ORDER — OXYCODONE-ACETAMINOPHEN 5-325 MG PO TABS: 1.0000 | ORAL_TABLET | ORAL | Status: DC | PRN

## 2018-06-26 MED ORDER — LACTATED RINGERS IV SOLN
INTRAVENOUS | Status: DC
  Administered 2018-06-26 – 2018-06-27 (×7): via INTRAVENOUS

## 2018-06-26 MED ORDER — INSULIN ASPART 100 UNIT/ML ~~LOC~~ SOLN
3.0000 [IU] | Freq: Once | SUBCUTANEOUS | Status: AC
  Administered 2018-06-26: 3 [IU] via SUBCUTANEOUS
  Filled 2018-06-26 (×3): qty 0.03

## 2018-06-26 MED ORDER — LIDOCAINE HCL (PF) 1 % IJ SOLN: 30.0000 mL | INTRAMUSCULAR | Status: DC | PRN

## 2018-06-26 NOTE — Progress Notes (Signed)
Glucose goals during Pregnancy: Fasting <90 mg/dl 2-hour Postprandial <161<120 mg/dl  Results for [name], [name] (MRN [id number]) as of 06/26/2018 13:57  Ref. Range 06/26/2018 00:29 06/26/2018 01:35 06/26/2018 03:51 06/26/2018 05:37 06/26/2018 07:38 06/26/2018 09:23 06/26/2018 09:53 06/26/2018 10:29 06/26/2018 12:40  Glucose-Capillary Latest Ref Range: 70 - 99 mg/dL 811155 (H) 914100 (H) 75 88 74 54 ([name]) 74 77 76    Admit for IOL  History: Type 1 DM prior to Pregnancy, GHTN  Home DM Meds: Levemir 47 units QHS       Novolog 15 units TID with meals + SSI  Current Insulin Orders: None yet     Per Dr. Oretha Milchherry's H&P notes, can initiate IV Insulin drip for blood sugars persistently >120s.  CBGs all less than 100 mg/dl since admission.  Note that patient saw her Endocrinologist (Dr. Verdis Fredericksonhomas [facility]'Connell [facility] on 05/29/2018.  At that visit, patient was instructed to reduce her insulin doses to her pre-pregnancy doses after delivery of baby.  Per Dr. Elberta Fortis'Connell's notes, patient was taking Levemir 32 units QHS and Novolog 1 unit for every 3 grams of Carbs prior to pregnancy.      MD- Once patient delivers her baby, she will need to resume basal and rapid-acting insulin to prevent ketoacidosis (patient has Type 1 DM and does not make any endogenous insulin).  Please consider ordering a portion of patient's pre-pregnancy Levemir dose once she has delivered.      --Will follow patient during hospitalization--  Ambrose FinlandJeannine Johnston Aislyn Hayse RN, MSN, CDE Diabetes Coordinator Inpatient Glycemic Control Team Team Pager: [phone] (8a-5p)

## 2018-06-26 NOTE — Progress Notes (Signed)
LABOR NOTE   [name] 22 y.o.@ at 90105w4d Not in labor.  SUBJECTIVE:  Pt not uncomfortable with occ CTXs.   OBJECTIVE:  BP 127/73   Pulse 65   Temp 98.5 F (36.9 C) (Oral)   Resp 15   Ht 5\' 6"  (1.676 m)   Wt 88.5 kg   LMP 10/06/2017   BMI 31.47 kg/m  No intake/output data recorded.  She has not shown cervical change. CERVIX: 0.5 cm:  50%:   -3:    SVE:   Dilation: Fingertip Effacement (%): 60 Station: -3 Exam by:: Cheynne Virden MD CONTRACTIONS: irregular, every 5-10 minutes FHR: Fetal heart tracing reviewed. Variability: Good {> 6 bpm) Category I   Analgesia: Labor support without medications  Labs: Lab Results  Component Value Date   WBC 8.8 06/26/2018   HGB 10.9 (L) 06/26/2018   HCT 31.1 (L) 06/26/2018   MCV 88.0 06/26/2018   PLT 190 06/26/2018    ASSESSMENT/PLAN: 1) Labor curve reviewed.       Progress: Not in labor.     Membranes: intact     Bps and glucose good.     Pt not in labor      2)  50 mcg misoprostol placed. 3)  Discussed sleep break through the night if not in labor after this dose of misoprostol with AROM and misoprostol in AM.  Pt agrees with plan.   Active Problems:   Pre-existing diabetes mellitus affecting childbirth  [name], M.D. 06/26/2018 4:00 PM

## 2018-06-26 NOTE — H&P (Signed)
Obstetric History and Physical  [name] is a 22 y.o. G1P0 with IUP at [redacted]w[redacted]d presenting for scheduled IOL for GHTN and Type 1 DM (poorly controlled during pregnancy). Patient denies contractions, no vaginal bleeding, intact membranes, with active fetal movement.    Prenatal Course Source of Care: [facility] with onset of care at 32 weeks (transfer of care from  [place], [place]).   Pregnancy complications or risks: Patient Active Problem List   Diagnosis Date Noted  . Pre-existing diabetes mellitus affecting childbirth 06/26/2018  . Elevated blood pressure affecting pregnancy in third trimester, antepartum 06/20/2018  . Labor and delivery indication for care or intervention 06/20/2018  . PIH (pregnancy induced hypertension) 06/20/2018  . Gastroesophageal reflux in pregnancy 05/13/2018  . Type 1 diabetes mellitus without complication (HCC) 03/07/2018  . Generalized anxiety disorder 01/31/2018  . Immune deficiency disorder (HCC) 01/05/2018  . High risk pregnancy, antepartum 12/20/2017   She plans to breastfeed She desires Christean Grief IUD for postpartum contraception.   Prenatal labs and studies: ABO, Rh: --/--/O POS (08/28 0100) Antibody: NEG (08/28 0100) Rubella:  Immune RPR: Non Reactive (07/22 1113)  HBsAg:  Negative HIV:   pending GNF:AOZHYQMV (08/14 1345) 1 hr Glucola: not performed as patient already diabetic Genetic screening normal. Had amniocentesis to confirm gender (due to discrepancy of ultrasound and genetic screening) Anatomy US normal   Past Medical History:  Diagnosis Date  . Diabetes mellitus without complication (HCC)   . DKA (diabetic ketoacidoses) (HCC) 02/25/2017  . Generalized anxiety disorder 01/31/2018  . Heart murmur    at birth    Past Surgical History:  Procedure Laterality Date  . NO PAST SURGERIES      OB History  Gravida Para Term Preterm AB Living  1            SAB TAB Ectopic Multiple Live Births               # Outcome Date  GA Lbr Len/2nd Weight Sex Delivery Anes PTL Lv  1 Current             Social History   Socioeconomic History  . Marital status: Single    Spouse name: Not on file  . Number of children: Not on file  . Years of education: Not on file  . Highest education level: Not on file  Occupational History  . Not on file  Social Needs  . Financial resource strain: Not on file  . Food insecurity:    Worry: Not on file    Inability: Not on file  . Transportation needs:    Medical: Not on file    Non-medical: Not on file  Tobacco Use  . Smoking status: Never Smoker  . Smokeless tobacco: Never Used  Substance and Sexual Activity  . Alcohol use: Not Currently  . Drug use: Never  . Sexual activity: Yes    Birth control/protection: IUD  Lifestyle  . Physical activity:    Days per week: Not on file    Minutes per session: Not on file  . Stress: Not on file  Relationships  . Social connections:    Talks on phone: Not on file    Gets together: Not on file    Attends religious service: Not on file    Active member of club or organization: Not on file    Attends meetings of clubs or organizations: Not on file    Relationship status: Not on file  Other Topics Concern  .  Not on file  Social History Narrative  . Not on file    Family History  Problem Relation Age of Onset  . Diabetes Father   . Cancer Neg Hx     Medications Prior to Admission  Medication Sig Dispense Refill Last Dose  . aspirin EC 81 MG tablet Take 81 mg by mouth daily.   Past Month at Unknown time  . insulin aspart (NOVOLOG) 100 UNIT/ML injection Inject 15 Units into the skin 3 (three) times daily before meals. Sliding scale    06/25/2018 at Unknown time  . insulin detemir (LEVEMIR) 100 UNIT/ML injection Inject 47 Units into the skin at bedtime.    06/25/2018 at Unknown time  . Prenatal Vit-Fe Fumarate-FA (PRENATAL MULTIVITAMIN) TABS tablet Take 1 tablet by mouth daily at 12 noon.   06/25/2018 at Unknown time  .  ranitidine (ZANTAC) 150 MG tablet Take 1 tablet (150 mg total) by mouth 2 (two) times daily. 60 tablet 3 06/25/2018 at Unknown time    Allergies  Allergen Reactions  . Sulfamethoxazole-Trimethoprim Hives  . Pravastatin Other (See Comments)    Review of Systems: Negative except for what is mentioned in HPI.  Physical Exam: BP 123/72   Pulse 62   Temp 98.1 F (36.7 C) (Oral)   Resp 17   Ht 5\' 6"  (1.676 m)   Wt 88.5 kg   LMP 10/06/2017   BMI 31.47 kg/m  CONSTITUTIONAL: Well-developed, well-nourished female in no acute distress.  HENT:  Normocephalic, atraumatic, External right and left ear normal. Oropharynx is clear and moist EYES: Conjunctivae and EOM are normal. Pupils are equal, round, and reactive to light. No scleral icterus.  NECK: Normal range of motion, supple, no masses SKIN: Skin is warm and dry. No rash noted. Not diaphoretic. No erythema. No pallor. NEUROLOGIC: Alert and oriented to person, place, and time. Normal reflexes, muscle tone coordination. No cranial nerve deficit noted. PSYCHIATRIC: Normal mood and affect. Normal behavior. Normal judgment and thought content. CARDIOVASCULAR: Normal heart rate noted, regular rhythm RESPIRATORY: Effort and breath sounds normal, no problems with respiration noted ABDOMEN: Soft, nontender, nondistended, gravid. MUSCULOSKELETAL: Normal range of motion. No edema and no tenderness. 2+ distal pulses.  Cervical Exam: Dilatation 0.5 cm   Effacement thick%   Station ballotable   Presentation: cephalic FHT:  Baseline rate 125 bpm   Variability moderate  Accelerations present   Decelerations none Contractions: Uterine irritability   Pertinent Labs/Studies:   Results for orders placed or performed during the hospital encounter of 06/25/18 (from the past 24 hour(s))  Glucose, capillary     Status: Abnormal   Collection Time: 06/26/18 12:29 AM  Result Value Ref Range   Glucose-Capillary 155 (H) 70 - 99 mg/dL  CBC     Status: Abnormal    Collection Time: 06/26/18 12:42 AM  Result Value Ref Range   WBC 8.8 3.6 - 11.0 K/uL   RBC 3.54 (L) 3.80 - 5.20 MIL/uL   Hemoglobin 10.9 (L) 12.0 - 16.0 g/dL   HCT 16.131.1 (L) 09.635.0 - 04.547.0 %   MCV 88.0 80.0 - 100.0 fL   MCH 30.8 26.0 - 34.0 pg   MCHC 34.9 32.0 - 36.0 g/dL   RDW 40.913.8 81.111.5 - 91.414.5 %   Platelets 190 150 - 440 K/uL  Type and screen     Status: None   Collection Time: 06/26/18  1:00 AM  Result Value Ref Range   ABO/RH(D) O POS    Antibody Screen NEG  Sample Expiration      06/29/2018 Performed at [facility], [address] [place], [place] [address]   Glucose, capillary     Status: Abnormal   Collection Time: 06/26/18  1:35 AM  Result Value Ref Range   Glucose-Capillary 100 (H) 70 - 99 mg/dL  Glucose, capillary     Status: None   Collection Time: 06/26/18  3:51 AM  Result Value Ref Range   Glucose-Capillary 75 70 - 99 mg/dL  Glucose, capillary     Status: None   Collection Time: 06/26/18  5:37 AM  Result Value Ref Range   Glucose-Capillary 88 70 - 99 mg/dL  Glucose, capillary     Status: None   Collection Time: 06/26/18  7:38 AM  Result Value Ref Range   Glucose-Capillary 74 70 - 99 mg/dL    Assessment : [name] is a 22 y.o. G1P0 at [redacted]w[redacted]d being admitted for induction of labor due to type 1 Dm (poorly controlled), and GHTN.  Plan: Labor: Induction of labor with Cytotec as ordered as per protocol. Analgesia as needed.  Manage diabetes per protocol.  Can begin insulin drip for blood sugars persistently over 120s.  FWB: Reassuring fetal heart tracing.  GBS negative Delivery plan: Hopeful for vaginal delivery  Transfer of care to Dr. Logan Bores, oncoming call OB physician.    Hildred Laser, MD [facility]

## 2018-06-27 ENCOUNTER — Other Ambulatory Visit: Payer: Medicaid Other

## 2018-06-27 ENCOUNTER — Encounter: Payer: Self-pay | Admitting: Anesthesiology

## 2018-06-27 ENCOUNTER — Inpatient Hospital Stay: Payer: BC Managed Care – PPO | Admitting: Anesthesiology

## 2018-06-27 ENCOUNTER — Encounter: Payer: Medicaid Other | Admitting: Obstetrics and Gynecology

## 2018-06-27 DIAGNOSIS — Z3A37 37 weeks gestation of pregnancy: Secondary | ICD-10-CM

## 2018-06-27 DIAGNOSIS — O134 Gestational [pregnancy-induced] hypertension without significant proteinuria, complicating childbirth: Secondary | ICD-10-CM

## 2018-06-27 DIAGNOSIS — O2402 Pre-existing diabetes mellitus, type 1, in childbirth: Secondary | ICD-10-CM

## 2018-06-27 LAB — GLUCOSE, CAPILLARY
GLUCOSE-CAPILLARY: 106 mg/dL — AB (ref 70–99)
GLUCOSE-CAPILLARY: 111 mg/dL — AB (ref 70–99)
GLUCOSE-CAPILLARY: 151 mg/dL — AB (ref 70–99)
GLUCOSE-CAPILLARY: 42 mg/dL — AB (ref 70–99)
GLUCOSE-CAPILLARY: 73 mg/dL (ref 70–99)
GLUCOSE-CAPILLARY: 88 mg/dL (ref 70–99)
Glucose-Capillary: 60 mg/dL — ABNORMAL LOW (ref 70–99)
Glucose-Capillary: 58 mg/dL — ABNORMAL LOW (ref 70–99)
Glucose-Capillary: 69 mg/dL — ABNORMAL LOW (ref 70–99)

## 2018-06-27 LAB — RPR: RPR: NONREACTIVE

## 2018-06-27 MED ORDER — LIDOCAINE-EPINEPHRINE (PF) 1.5 %-1:200000 IJ SOLN
INTRAMUSCULAR | Status: DC | PRN
  Administered 2018-06-27: 3 mL via PERINEURAL

## 2018-06-27 MED ORDER — PHENYLEPHRINE 40 MCG/ML (10ML) SYRINGE FOR IV PUSH (FOR BLOOD PRESSURE SUPPORT)
80.0000 ug | PREFILLED_SYRINGE | INTRAVENOUS | Status: DC | PRN
  Filled 2018-06-27: qty 5

## 2018-06-27 MED ORDER — FENTANYL 2.5 MCG/ML W/ROPIVACAINE 0.15% IN NS 100 ML EPIDURAL (ARMC)
EPIDURAL | Status: AC
  Filled 2018-06-27: qty 100

## 2018-06-27 MED ORDER — TERBUTALINE SULFATE 1 MG/ML IJ SOLN: 0.2500 mg | Freq: Once | INTRAMUSCULAR | Status: DC | PRN

## 2018-06-27 MED ORDER — FENTANYL 2.5 MCG/ML W/ROPIVACAINE 0.15% IN NS 100 ML EPIDURAL (ARMC)
12.0000 mL/h | EPIDURAL | Status: DC
  Administered 2018-06-27 (×2): 12 mL/h via EPIDURAL
  Filled 2018-06-27: qty 100

## 2018-06-27 MED ORDER — DIPHENHYDRAMINE HCL 50 MG/ML IJ SOLN: 12.5000 mg | INTRAMUSCULAR | Status: DC | PRN

## 2018-06-27 MED ORDER — EPHEDRINE 5 MG/ML INJ
10.0000 mg | INTRAVENOUS | Status: DC | PRN
  Filled 2018-06-27: qty 2

## 2018-06-27 MED ORDER — FLEET ENEMA 7-19 GM/118ML RE ENEM
1.0000 | ENEMA | Freq: Once | RECTAL | Status: AC
  Administered 2018-06-27: 1 via RECTAL

## 2018-06-27 MED ORDER — LIDOCAINE HCL (PF) 1 % IJ SOLN
INTRAMUSCULAR | Status: DC | PRN
  Administered 2018-06-27: 3 mL via INTRADERMAL

## 2018-06-27 MED ORDER — LACTATED RINGERS IV SOLN: 500.0000 mL | Freq: Once | INTRAVENOUS | Status: DC

## 2018-06-27 MED ORDER — OXYTOCIN 40 UNITS IN LACTATED RINGERS INFUSION - SIMPLE MED
1.0000 m[IU]/min | INTRAVENOUS | Status: DC
  Administered 2018-06-27: 4 m[IU]/min via INTRAVENOUS
  Filled 2018-06-27: qty 1000

## 2018-06-27 MED ORDER — BUPIVACAINE HCL (PF) 0.25 % IJ SOLN
INTRAMUSCULAR | Status: DC | PRN
  Administered 2018-06-27: 4 mL via EPIDURAL
  Administered 2018-06-27: 3 mL via EPIDURAL

## 2018-06-27 NOTE — Progress Notes (Signed)
Patient ID: [name], female   DOB: 09/13/1996, 22 y.o.   MRN: [id number]  LABOR NOTE   Gabbie L Luckow 22 y.o.@ at 3753w5d Not in labor.  SUBJECTIVE:  Patient slept through the night.  Denies contractions.  OBJECTIVE:  BP 137/71   Pulse (!) 55   Temp 98.2 F (36.8 C)   Resp 16   Ht 5\' 6"  (1.676 m)   Wt 88.5 kg   LMP 10/06/2017   SpO2 97%   BMI 31.47 kg/m  No intake/output data recorded.  She has shown cervical change. CERVIX: 1 cm:  50%:   -3: SVE:   Dilation: 1 Effacement (%): 50 Station: -3 Exam by:: Dr. Logan BoresEvans CONTRACTIONS: none FHR: Fetal heart tracing reviewed. Variability: Good {> 6 bpm) Category I  AROM-clear fluid noted  50 mcg misoprostol placed Analgesia:   Labs: Lab Results  Component Value Date   WBC 8.8 06/26/2018   HGB 10.9 (L) 06/26/2018   HCT 31.1 (L) 06/26/2018   MCV 88.0 06/26/2018   PLT 190 06/26/2018    ASSESSMENT: 1) Labor curve reviewed.       Progress:      Membranes: ruptured, clear fluid     Patient has shown a small amount of cervical change and the cervix has moved more anteriorly.       Active Problems:   Pre-existing diabetes mellitus affecting childbirth   PLAN: Cytotec given.  Expect labor progress and vaginal birth.   Elonda Huskyavid J. Daniah Zaldivar, M.D. 06/27/2018 9:18 AM

## 2018-06-27 NOTE — Progress Notes (Signed)
Glucose goals during Pregnancy: Fasting <90 mg/dl 2-hour Postprandial <161<120 mg/dl  Results for [name], [name] (MRN [id number]) as of 06/27/2018 15:20  Ref. Range 06/27/2018 00:20 06/27/2018 02:43 06/27/2018 04:56 06/27/2018 07:58 06/27/2018 08:31 06/27/2018 11:54 06/27/2018 12:49  Glucose-Capillary Latest Ref Range: 70 - 99 mg/dL 811111 (H) 88 73 42 (LL) 60 ([name]) 58 ([name]) 69 ([name])     Admit for IOL  History: Type 1 DM prior to Pregnancy, GHTN  Home DM Meds: Levemir 47 units QHS                             Novolog 15 units TID with meals + SSI  Current Insulin Orders: Levemir 47 units QHS    Per Dr. Oretha Milchherry's H&P notes, can initiate IV Insulin drip for blood sugars persistently >120s.  CBGs all less than 100 mg/dl since admission.  Note that patient saw her Endocrinologist (Dr. Verdis [facility]icksonhomas [facility]) on 05/29/2018.  At that visit, patient was instructed to reduce her insulin doses to her pre-pregnancy doses after delivery of baby.  Per Dr. Elberta Fortis'Connell's notes, patient was taking Levemir 32 units QHS and Novolog 1 unit for every 3 grams of Carbs prior to pregnancy.      MD- Once patient delivers her baby, she will need to resume basal and rapid-acting insulin to prevent ketoacidosis (patient has Type 1 DM and does not make any endogenous insulin).  Please consider ordering a portion of patient's pre-pregnancy Levemir dose once she has delivered.  If decision made to administer Levemir tonight, please consider reduction of Levemir dose by 50% since patient having Hypoglycemia since 8am today (received 47 units Levemir last PM).     --Will follow patient during hospitalization--  Ambrose FinlandJeannine Johnston Silvestre Mines RN, MSN, CDE Diabetes Coordinator Inpatient Glycemic Control Team Team Pager: [phone] (8a-5p)

## 2018-06-27 NOTE — Anesthesia Procedure Notes (Signed)
Epidural Patient location during procedure: OB Start time: 06/27/2018 3:26 PM End time: 06/27/2018 3:38 PM  Staffing Anesthesiologist: Yevette EdwardsAdams, James G, MD Resident/CRNA: Ginger CarneMichelet, Silvio Sausedo, CRNA Performed: resident/CRNA   Preanesthetic Checklist Completed: patient identified, site marked, surgical consent, pre-op evaluation, timeout performed, IV checked, risks and benefits discussed and monitors and equipment checked  Epidural Patient position: sitting Prep: ChloraPrep Patient monitoring: heart rate, continuous pulse ox and blood pressure Approach: midline Location: L3-L4 Injection technique: LOR saline  Needle:  Needle type: Tuohy  Needle gauge: 17 G Needle length: 9 cm and 9 Needle insertion depth: 8 cm Catheter type: closed end flexible Catheter size: 19 Gauge Catheter at skin depth: 13 cm Test dose: negative and 1.5% lidocaine with Epi 1:200 K  Assessment Sensory level: T10 Events: blood not aspirated, injection not painful, no injection resistance, negative IV test and no paresthesia  Additional Notes 1 attempt Pt. Evaluated and documentation done after procedure finished. Patient identified. Risks/Benefits/Options discussed with patient including but not limited to bleeding, infection, nerve damage, paralysis, failed block, incomplete pain control, headache, blood pressure changes, nausea, vomiting, reactions to medication both or allergic, itching and postpartum back pain. Confirmed with bedside nurse the patient's most recent platelet count. Confirmed with patient that they are not currently taking any anticoagulation, have any bleeding history or any family history of bleeding disorders. Patient expressed understanding and wished to proceed. All questions were answered. Sterile technique was used throughout the entire procedure. Please see nursing notes for vital signs. Test dose was given through epidural catheter and negative prior to continuing to dose epidural or  start infusion. Warning signs of high block given to the patient including shortness of breath, tingling/numbness in hands, complete motor block, or any concerning symptoms with instructions to call for help. Patient was given instructions on fall risk and not to get out of bed. All questions and concerns addressed with instructions to call with any issues or inadequate analgesia.   Patient tolerated the insertion well without immediate complications.Reason for block:procedure for pain

## 2018-06-27 NOTE — Anesthesia Preprocedure Evaluation (Addendum)
Anesthesia Evaluation  Patient identified by MRN, date of birth, ID band Patient awake    Reviewed: Allergy & Precautions, H&P , NPO status , Patient's Chart, lab work & pertinent test results, reviewed documented beta blocker date and time   Airway Mallampati: II  TM Distance: >3 FB Neck ROM: full  Mouth opening: Limited Mouth Opening  Dental no notable dental hx. (+) Teeth Intact   Pulmonary neg pulmonary ROS, Current Smoker,    Pulmonary exam normal breath sounds clear to auscultation       Cardiovascular Exercise Tolerance: Good hypertension, negative cardio ROS Normal cardiovascular exam Rhythm:regular Rate:Normal     Neuro/Psych PSYCHIATRIC DISORDERS Anxiety negative neurological ROS  negative psych ROS   GI/Hepatic negative GI ROS, Neg liver ROS, GERD  ,  Endo/Other  negative endocrine ROSdiabetes, Well Controlled, Type 1  Renal/GU   negative genitourinary   Musculoskeletal   Abdominal   Peds  Hematology negative hematology ROS (+)   Anesthesia Other Findings   Reproductive/Obstetrics (+) Pregnancy                            Anesthesia Physical Anesthesia Plan  ASA: II  Anesthesia Plan: Epidural   Post-op Pain Management:    Induction:   PONV Risk Score and Plan:   Airway Management Planned:   Additional Equipment:   Intra-op Plan:   Post-operative Plan:   Informed Consent: I have reviewed the patients History and Physical, chart, labs and discussed the procedure including the risks, benefits and alternatives for the proposed anesthesia with the patient or authorized representative who has indicated his/her understanding and acceptance.   Dental Advisory Given  Plan Discussed with: Anesthesiologist  Anesthesia Plan Comments:        Anesthesia Quick Evaluation

## 2018-06-27 NOTE — Progress Notes (Signed)
Patient ID: [name], female   DOB: [birth year], 22 y.o.   MRN: [id number]   LABOR NOTE   [name] 22 y.o.@ at 5182w5d latent labor.  SUBJECTIVE:  Patient received Stadol and is comfortable with contractions.  She is considering an epidural in the near future. OBJECTIVE:  BP 132/63   Pulse 70   Temp 97.9 F (36.6 C) (Oral)   Resp 16   Ht 5\' 6"  (1.676 m)   Wt 88.5 kg   LMP 10/06/2017   SpO2 97%   BMI 31.47 kg/m  Total I/O In: 3500 [I.V.:3500] Out: -   She has shown cervical change. CERVIX: 4:  100%:   -2:   anterior:   soft SVE:   Dilation: 4.5 Effacement (%): 90 Station: -2 Exam by:: [name], RN3 CONTRACTIONS: Irregular-not picking up well with external monitoring. FHR: Fetal heart tracing reviewed. Variability: Good {> 6 bpm) Category I occasional early decelerations.   IUPC placed  Labs: Lab Results  Component Value Date   WBC 8.8 06/26/2018   HGB 10.9 (L) 06/26/2018   HCT 31.1 (L) 06/26/2018   MCV 88.0 06/26/2018   PLT 190 06/26/2018    ASSESSMENT: 1) Labor curve reviewed.       Progress: Latent labor     Membranes: intact, ruptured            Active Problems:   Pre-existing diabetes mellitus affecting childbirth   PLAN: 1)  Empty rectum.  Shower 2)  50 mcg Misoprostol   Selena Huskyavid J. Edessa [name], M.D. 06/27/2018 1:43 PM

## 2018-06-28 LAB — GLUCOSE, CAPILLARY
GLUCOSE-CAPILLARY: 273 mg/dL — AB (ref 70–99)
GLUCOSE-CAPILLARY: 229 mg/dL — AB (ref 70–99)
Glucose-Capillary: 233 mg/dL — ABNORMAL HIGH (ref 70–99)
Glucose-Capillary: 163 mg/dL — ABNORMAL HIGH (ref 70–99)
Glucose-Capillary: 114 mg/dL — ABNORMAL HIGH (ref 70–99)

## 2018-06-28 MED ORDER — ACETAMINOPHEN 325 MG PO TABS: 650.0000 mg | ORAL_TABLET | ORAL | Status: DC | PRN

## 2018-06-28 MED ORDER — INSULIN DETEMIR 100 UNIT/ML ~~LOC~~ SOLN
47.0000 [IU] | Freq: Every day | SUBCUTANEOUS | Status: DC
  Filled 2018-06-28: qty 0.47

## 2018-06-28 MED ORDER — INSULIN DETEMIR 100 UNIT/ML ~~LOC~~ SOLN
32.0000 [IU] | Freq: Every day | SUBCUTANEOUS | Status: DC
  Administered 2018-06-28: 32 [IU] via SUBCUTANEOUS
  Filled 2018-06-28 (×2): qty 0.32

## 2018-06-28 MED ORDER — BENZOCAINE-MENTHOL 20-0.5 % EX AERO: 1.0000 "application " | INHALATION_SPRAY | CUTANEOUS | Status: DC | PRN

## 2018-06-28 MED ORDER — OXYCODONE-ACETAMINOPHEN 5-325 MG PO TABS: 1.0000 | ORAL_TABLET | ORAL | Status: DC | PRN

## 2018-06-28 MED ORDER — INSULIN ASPART 100 UNIT/ML ~~LOC~~ SOLN
15.0000 [IU] | Freq: Three times a day (TID) | SUBCUTANEOUS | Status: DC
  Filled 2018-06-28: qty 1

## 2018-06-28 MED ORDER — PRENATAL MULTIVITAMIN CH
1.0000 | ORAL_TABLET | Freq: Every day | ORAL | Status: DC
  Administered 2018-06-28 – 2018-06-29 (×2): 1 via ORAL
  Filled 2018-06-28 (×2): qty 1

## 2018-06-28 MED ORDER — DIPHENHYDRAMINE HCL 25 MG PO CAPS: 25.0000 mg | ORAL_CAPSULE | Freq: Four times a day (QID) | ORAL | Status: DC | PRN

## 2018-06-28 MED ORDER — TETANUS-DIPHTH-ACELL PERTUSSIS 5-2.5-18.5 LF-MCG/0.5 IM SUSP: 0.5000 mL | Freq: Once | INTRAMUSCULAR | Status: DC

## 2018-06-28 MED ORDER — INSULIN ASPART 100 UNIT/ML ~~LOC~~ SOLN
SUBCUTANEOUS | Status: AC
  Filled 2018-06-28: qty 1

## 2018-06-28 MED ORDER — ZOLPIDEM TARTRATE 5 MG PO TABS: 5.0000 mg | ORAL_TABLET | Freq: Every evening | ORAL | Status: DC | PRN

## 2018-06-28 MED ORDER — SIMETHICONE 80 MG PO CHEW: 80.0000 mg | CHEWABLE_TABLET | ORAL | Status: DC | PRN

## 2018-06-28 MED ORDER — INSULIN ASPART 100 UNIT/ML ~~LOC~~ SOLN
8.0000 [IU] | Freq: Three times a day (TID) | SUBCUTANEOUS | Status: DC
  Administered 2018-06-28 (×2): 8 [IU] via SUBCUTANEOUS
  Filled 2018-06-28 (×2): qty 1

## 2018-06-28 MED ORDER — INSULIN ASPART 100 UNIT/ML ~~LOC~~ SOLN
0.0000 [IU] | Freq: Three times a day (TID) | SUBCUTANEOUS | Status: DC
  Administered 2018-06-28 (×2): 3 [IU] via SUBCUTANEOUS
  Filled 2018-06-28 (×2): qty 1

## 2018-06-28 MED ORDER — IBUPROFEN 600 MG PO TABS
600.0000 mg | ORAL_TABLET | Freq: Four times a day (QID) | ORAL | Status: DC
  Administered 2018-06-28 – 2018-06-29 (×6): 600 mg via ORAL
  Filled 2018-06-28 (×6): qty 1

## 2018-06-28 MED ORDER — DOCUSATE SODIUM 100 MG PO CAPS
100.0000 mg | ORAL_CAPSULE | Freq: Two times a day (BID) | ORAL | Status: DC
  Administered 2018-06-28 – 2018-06-29 (×3): 100 mg via ORAL
  Filled 2018-06-28 (×3): qty 1

## 2018-06-28 MED ORDER — OXYTOCIN 40 UNITS IN LACTATED RINGERS INFUSION - SIMPLE MED
2.5000 [IU]/h | INTRAVENOUS | Status: DC | PRN
  Filled 2018-06-28: qty 1000

## 2018-06-28 NOTE — Progress Notes (Signed)
Patient ID: [name], female   DOB: [birth year], 22 y.o.   MRN: [id number]  Progress Note - Vaginal Delivery  [name] is a 22 y.o. G1P1001 now PP day 1 s/p Vaginal, Vacuum (Extractor) .   Subjective:  The patient reports no complaints, up ad lib, voiding and tolerating PO   Objective:  Vital signs in last 24 hours: Temp:  [97.9 F (36.6 C)-100.8 F (38.2 C)] 97.9 F (36.6 C) (08/30 0813) Pulse Rate:  [66-119] 75 (08/30 0813) Resp:  [16-20] 16 (08/30 0813) BP: (112-144)/(52-98) 115/68 (08/30 0813) SpO2:  [96 %-100 %] 98 % (08/30 0813)  Physical Exam:  General: alert, cooperative and no distress Lochia: appropriate Uterine Fundus: firm DVT Evaluation: No evidence of DVT seen on physical exam.    Data Review Recent Labs    06/26/18 0042  HGB 10.9*  HCT 31.1*    Assessment/Plan: Active Problems:   Pre-existing diabetes mellitus affecting childbirth   Doing well PP. Possible discharge tomorrow.  -- Continue routine PP care.     Elonda Huskyavid J. Evans, M.D. 06/28/2018 9:01 AM

## 2018-06-28 NOTE — Progress Notes (Addendum)
Received referral for this patient this AM.  Met with patient at bedside.  Patient told me she was taking the following insulins prior to pregnancy:  Levemir 32 units QHS  Novolog 1 unit for every 4 grams of Carbohydrates Novolog 1 unit for every 50 mg/dl above target CBG of 150 mg/dl   Reviewed the conversation notes from pt's last visit with her Endocrinologist Dr. Honor Junes with [facility] (visit was 05/29/2018).  Per Dr. Sherren Mocha Notes, Dr. Honor Junes instructed pt to return to her pre-pregnancy insulin doses after delivery of her baby. Reviewed these instructions with the patient.  Patient was able to verbalize to me the changes in her doses.  Knows how to carb count and knows how to use her correction factor.  Also reminded pt that Dr. Honor Junes wants her to reduce her Levemir back to 32 units QHS at home.  Patient stated understanding.  Also discussed with pt that we will be monitoring her CBGs closely in the hospital and the medical team will be adjusting her insulin doses based on her CBG readings.    RN Raquel Sarna called Dr. Amalia Hailey to request insulin adjustments.  We did not receive a call back within 30 minutes so we paged Dr. Marcelline Mates.  Dr. Marcelline Mates gave Raquel Sarna, RN orders for the following: Reduce Levemir to 32 units QHS Reduce Novolog Meal Coverage to 8 units TID with meals Start Novolog Sensitive Correction Scale/ SSI (0-9 units) TID AC Change PO diet to Carbohydrate Modified diet  Orders entered into [facility] by Raquel Sarna, RN.  RN and DM Coordinator reviewed the orders for accuracy.  RN to give Novolog SSI coverage for 10:45am CBG and then start reduced dose of Novolog meal coverage at 12pm when pt's spouse brings her lunch.      --Will follow patient during hospitalization--  Wyn Quaker RN, MSN, CDE Diabetes Coordinator Inpatient Glycemic Control Team Team Pager: [phone] (8a-5p)

## 2018-06-28 NOTE — Progress Notes (Addendum)
Inpatient Diabetes Program Recommendations  AACE/ADA: New Consensus Statement on Inpatient Glycemic Control (2015)  Target Ranges:  Prepandial:   less than 140 mg/dL      Peak postprandial:   less than 180 mg/dL (1-2 hours)      Critically ill patients:  140 - 180 mg/dL   Results for [name], [name] (MRN [id number]) as of 06/28/2018 07:47  Ref. Range 06/27/2018 00:20 06/27/2018 02:43 06/27/2018 04:56 06/27/2018 07:58 06/27/2018 08:31 06/27/2018 11:54 06/27/2018 12:49 06/27/2018 17:40 06/27/2018 22:29  Glucose-Capillary Latest Ref Range: 70 - 99 mg/dL 409111 (H) 88 73 42 (LL) 60 ([name]) 58 ([name]) 69 ([name]) 106 (H) 151 (H)    Admitfor IOL  History:Type 1 DM prior to Pregnancy, GHTN  Home DM Meds:Levemir 47 units QHS Novolog 15 units TID with meals + SSI  CurrentInsulin Orders: None      Note that patient saw her Endocrinologist ([facility] Verdis [facility]omas [facility] on 05/29/2018.At that visit, patient was instructed to reduce her insulin doses to her pre-pregnancy doses after delivery of baby. Per Dr. Elberta Fortis'Connell's notes, patient was taking Levemir 32 units QHS and Novolog 1 unit for every 3 grams of Carbs prior to pregnancy.      MD- Note that patient received 47 units Levemir last PM at Midnight.  No orders for CBG checks, No Orders for Insulin at present.  Please consider placing orders for Novolog Sensitive Correction Scale/ SSI (0-9 units) TID AC + HS  Please use the Glycemic Control Order set  We can check patient's CBGs TID AC now since she has delivered.  Will also likely need to resume Levemir basal insulin tonight at bedtime, but will likely need much reduced dose.        --Will follow patient during hospitalization--  Ambrose FinlandJeannine Johnston Isaura Schiller RN, MSN, CDE Diabetes Coordinator Inpatient Glycemic Control Team Team Pager: [phone] (8a-5p)

## 2018-06-29 LAB — GLUCOSE, CAPILLARY
GLUCOSE-CAPILLARY: 66 mg/dL — AB (ref 70–99)
GLUCOSE-CAPILLARY: 69 mg/dL — AB (ref 70–99)
GLUCOSE-CAPILLARY: 51 mg/dL — AB (ref 70–99)
GLUCOSE-CAPILLARY: 93 mg/dL (ref 70–99)
Glucose-Capillary: 89 mg/dL (ref 70–99)
Glucose-Capillary: 95 mg/dL (ref 70–99)
Glucose-Capillary: 31 mg/dL — CL (ref 70–99)

## 2018-06-29 MED ORDER — INSULIN ASPART 100 UNIT/ML ~~LOC~~ SOLN: 8.0000 [IU] | Freq: Three times a day (TID) | SUBCUTANEOUS | 3 refills | Status: DC

## 2018-06-29 MED ORDER — INSULIN ASPART 100 UNIT/ML ~~LOC~~ SOLN
10.0000 [IU] | Freq: Three times a day (TID) | SUBCUTANEOUS | Status: DC
  Administered 2018-06-29: 10 [IU] via SUBCUTANEOUS
  Filled 2018-06-29: qty 1

## 2018-06-29 MED ORDER — FERROUS SULFATE 325 (65 FE) MG PO TABS: 325.0000 mg | ORAL_TABLET | Freq: Every day | ORAL | 1 refills | Status: DC

## 2018-06-29 MED ORDER — INSULIN ASPART 100 UNIT/ML ~~LOC~~ SOLN: 0.0000 [IU] | Freq: Three times a day (TID) | SUBCUTANEOUS | Status: DC

## 2018-06-29 MED ORDER — INSULIN DETEMIR 100 UNIT/ML ~~LOC~~ SOLN: 32.0000 [IU] | Freq: Every day | SUBCUTANEOUS | 6 refills | Status: DC

## 2018-06-29 MED ORDER — INSULIN ASPART 100 UNIT/ML ~~LOC~~ SOLN
12.0000 [IU] | Freq: Three times a day (TID) | SUBCUTANEOUS | Status: DC
  Administered 2018-06-29: 12 [IU] via SUBCUTANEOUS
  Filled 2018-06-29 (×2): qty 1

## 2018-06-29 NOTE — Lactation Note (Signed)
This note was copied from a baby's chart. Lactation Consultation Note  Patient Name: [name] Today's Date: 06/29/2018 Reason for consult: Initial assessment;Primapara;Early term 37-38.6wks;Other (Comment);Mother's request;Difficult latch(Mom giving bottles of formula - Only BF once since delivery) Initial chose of feeding on admission was to breast and bottle feed.  She breast fed shortly after midnight on 06/28/2018.  That was the last recording of mom putting baby to the breast.  When went in to check on mom's feeding choice this am, she had just given 23 ml Enfamil via bottle.  Mom reports she does want to breast feed, but cannot get baby to latch.  Lactation name and number was written on white board and encouraged mom to call for next feeding.  Mom did not call, but gave bottle of formula again before 3 hours from last feeding.  [name] was stirring, so changed void and stool and was awake enough to try and put to breast and mom was willing to try.  Mom has slightly flat nipples at rest, but does evert some when stimulated and compressed.  Demonstrated hand expression.  Could get [name] to latch, but not as deep as would have liked for her to attach.  She began rhythmic sucking for short period and then fell back to sleep requiring frequent stimulation to get her to attempt sucking.  Since mom had just given [name] 15 ml, encouraged her to leave her at the breast for any more sucking she could do demonstrating waking techniques and breast massage.  Mom not aggressive letting LC hold and latch [name].  Mom already has a Symphony set up in room.  Discussed supply and demand and need for frequent breast feeding and or pumping to bring in mature milk and ensure a plentiful milk supply.  Discouraged bottlefeeding of formula and [facility] feeding before she is discharged home.   Maternal Data Formula Feeding for Exclusion: No Has patient been taught  Hand Expression?: Yes Does the patient have breastfeeding experience prior to this delivery?: No  Feeding Feeding Type: Breast Fed Nipple Type: Slow - flow Length of feed: (Sustains latch, but not a lot of consistent sucking)  LATCH Score Latch: Repeated attempts needed to sustain latch, nipple held in mouth throughout feeding, stimulation needed to elicit sucking reflex.  Audible Swallowing: A few with stimulation  Type of Nipple: Flat(Does evert with compression & stimulation)  Comfort (Breast/Nipple): Soft / non-tender  Hold (Positioning): Assistance needed to correctly position infant at breast and maintain latch.  LATCH Score: 6  Interventions Interventions: Breast feeding basics reviewed;Reverse pressure;Assisted with latch;Breast compression;Adjust position;Breast massage;Support pillows;Hand express;Position options;DEBP;Pre-pump if needed  Lactation Tools Discussed/Used Tools: Pump Breast pump type: Double-Electric Breast Pump WIC Program: Yes(Has BCBS, but not maternity) Pump Review: Setup, frequency, and cleaning;Milk Storage Initiated by:: Emmit PomfretLydia Vernon, RN Date initiated:: 06/28/18   Consult Status Consult Status: Follow-up Date: 06/29/18 Follow-up type: Call as needed    Louis MeckelWilliams, Teah Votaw Kay 06/29/2018, 12:01 PM

## 2018-06-29 NOTE — Progress Notes (Signed)
Post Partum Day # 2, s/p SVD  Subjective: no complaints, up ad lib, voiding and tolerating PO  Objective: Vitals:   06/28/18 1233 06/28/18 1613 06/28/18 2333 06/29/18 0735  BP: 126/75 123/70 129/77 128/69  Pulse: 79 89 82 74  Resp: 20 16 18 20   Temp: 98.2 F (36.8 C) 98.3 F (36.8 C) 98 F (36.7 C) 97.8 F (36.6 C)  TempSrc: Oral Oral Oral Oral  SpO2: 99% 98% 98% 97%  Weight:      Height:        Physical Exam:  General: alert and no distress  Lungs: clear to auscultation bilaterally Breasts: normal appearance, no masses or tenderness Heart: regular rate and rhythm, S1, S2 normal, no murmur, click, rub or gallop Abdomen: soft, non-tender; bowel sounds normal; no masses,  no organomegaly Pelvis: Lochia: appropriate, Uterine Fundus: firm Extremities: DVT Evaluation: No evidence of DVT seen on physical exam. Negative Homan's sign. No cords or calf tenderness. No significant calf/ankle edema.   Lab Results  Component Value Date   WBC 8.8 06/26/2018   HGB 10.9 (L) 06/26/2018   HCT 31.1 (L) 06/26/2018   MCV 88.0 06/26/2018   PLT 190 06/26/2018     Results for [name], Selena L (MRN [id number]) as of 06/29/2018 09:40  Ref. Range 06/28/2018 08:55 06/28/2018 10:43 06/28/2018 17:36 06/28/2018 19:43 06/28/2018 21:52 06/29/2018 08:58  Glucose-Capillary Latest Ref Range: 70 - 99 mg/dL 782273 (H) 956233 (H) 213229 (H) 163 (H) 114 (H) 66 (L)   Assessment/Plan: Breastfeeding, Lactation consult as needed.  Notes milk has not yet let down. May require supplementation.  Contraception: Skyla IUD Blood sugars elevated with 8 units/meal (average 220s), required sliding scale insulin. Increased dosing to 12 units. Monitor for hypoglycemia. Also strongly encouraged strict dietary adherence (as patient noted to have non-reduced sugar soda and white toast on breakfast tray this morning).  Nighttime blood sugars are good (currently on 32 units of Levemir) Mild anemia of pregnancy, will continue to treat with PO  iron.  Dispo: possibly d/c later today if blood sugars improve on new regimen, otherwise will d/c home tomorrow.     LOS: 3 days   Hildred Laserherry, Liela Rylee, MD [facility] 06/29/2018 9:27 AM

## 2018-06-29 NOTE — Anesthesia Postprocedure Evaluation (Signed)
Anesthesia Post Note  Patient: Hospital doctorAmber L [name]  Procedure(s) Performed: AN AD HOC LABOR EPIDURAL  Patient location during evaluation: Mother Baby Anesthesia Type: Epidural Level of consciousness: oriented and awake and alert Pain management: pain level controlled Vital Signs Assessment: post-procedure vital signs reviewed and stable Respiratory status: spontaneous breathing Cardiovascular status: blood pressure returned to baseline and stable Postop Assessment: no headache, no backache, no apparent nausea or vomiting, patient able to bend at knees and able to ambulate Anesthetic complications: no     Last Vitals:  Vitals:   06/28/18 2333 06/29/18 0735  BP: 129/77 128/69  Pulse: 82 74  Resp: 18 20  Temp: 36.7 C 36.6 C  SpO2: 98% 97%    Last Pain:  Vitals:   06/29/18 0745  TempSrc:   PainSc: 0-No pain                 Cleda MccreedyJoseph K Piscitello

## 2018-06-29 NOTE — Progress Notes (Signed)
Pt. D/C to home v/o of education and f/u appointments. Pt. Is alert and oriented with appropriate affect. Denies c/o. Sig. Other supportive and at bedside.

## 2018-07-02 ENCOUNTER — Encounter: Payer: Medicaid Other | Admitting: Obstetrics and Gynecology

## 2018-07-03 ENCOUNTER — Ambulatory Visit (INDEPENDENT_AMBULATORY_CARE_PROVIDER_SITE_OTHER): Payer: Medicaid Other | Admitting: Obstetrics and Gynecology

## 2018-07-03 ENCOUNTER — Encounter: Payer: Self-pay | Admitting: Obstetrics and Gynecology

## 2018-07-03 VITALS — BP 175/94 | HR 60 | Temp 98.4°F | Ht 66.0 in | Wt 192.0 lb

## 2018-07-03 DIAGNOSIS — O9279 Other disorders of lactation: Secondary | ICD-10-CM | POA: Diagnosis not present

## 2018-07-03 DIAGNOSIS — O139 Gestational [pregnancy-induced] hypertension without significant proteinuria, unspecified trimester: Secondary | ICD-10-CM

## 2018-07-03 LAB — CBC WITH DIFFERENTIAL/PLATELET
BASOS ABS: 0 10*3/uL (ref 0.0–0.2)
BASOS: 0 %
EOS (ABSOLUTE): 0.3 10*3/uL (ref 0.0–0.4)
EOS: 3 %
HEMATOCRIT: 28.5 % — AB (ref 34.0–46.6)
HEMOGLOBIN: 9.7 g/dL — AB (ref 11.1–15.9)
IMMATURE GRANS (ABS): 0 10*3/uL (ref 0.0–0.1)
Immature Granulocytes: 0 %
Lymphocytes Absolute: 1.6 10*3/uL (ref 0.7–3.1)
Lymphs: 18 %
MCH: 29.3 pg (ref 26.6–33.0)
MCHC: 34 g/dL (ref 31.5–35.7)
MCV: 86 fL (ref 79–97)
MONOCYTES: 7 %
Monocytes Absolute: 0.7 10*3/uL (ref 0.1–0.9)
NEUTROS ABS: 6.4 10*3/uL (ref 1.4–7.0)
Neutrophils: 72 %
Platelets: 243 10*3/uL (ref 150–450)
RBC: 3.31 x10E6/uL — ABNORMAL LOW (ref 3.77–5.28)
RDW: 13.4 % (ref 12.3–15.4)
WBC: 8.9 10*3/uL (ref 3.4–10.8)

## 2018-07-03 LAB — BASIC METABOLIC PANEL
BUN/Creatinine Ratio: 9 (ref 9–23)
BUN: 9 mg/dL (ref 6–20)
CO2: 19 mmol/L — ABNORMAL LOW (ref 20–29)
CREATININE: 1 mg/dL (ref 0.57–1.00)
Calcium: 8.1 mg/dL — ABNORMAL LOW (ref 8.7–10.2)
Chloride: 109 mmol/L — ABNORMAL HIGH (ref 96–106)
GFR calc Af Amer: 91 mL/min/{1.73_m2} (ref >59)
GFR, EST NON AFRICAN AMERICAN: 79 mL/min/{1.73_m2} (ref >59)
Glucose: 105 mg/dL — ABNORMAL HIGH (ref 65–99)
Potassium: 3.9 mmol/L (ref 3.5–5.2)
Sodium: 142 mmol/L (ref 134–144)

## 2018-07-03 LAB — PROTEIN / CREATININE RATIO, URINE
Creatinine, Urine: 50.1 mg/dL
PROTEIN/CREAT RATIO: 220 mg/g{creat} — AB (ref 0–200)
Protein, Ur: 11 mg/dL

## 2018-07-03 NOTE — Progress Notes (Signed)
HPI:      [name] is a 22 y.o. G1P1001 who LMP was Patient's last menstrual period was 10/06/2017.  Subjective:   She presents today with complaint of bilateral breast soreness.  She reports that she has had a low-grade fever.  She was initially breast-feeding but has since stopped.  (Baby has been admitted to NICU for jaundice) -she is not interested in pumping. She has had vaginal bleeding since delivery, lochia, but is otherwise doing well without problem.    Hx: The following portions of the patient's history were reviewed and updated as appropriate:             She  has a past medical history of Diabetes mellitus without complication (HCC), DKA (diabetic ketoacidoses) (HCC) (02/25/2017), Generalized anxiety disorder (01/31/2018), and Heart murmur. She does not have any pertinent problems on file. She  has a past surgical history that includes No past surgeries. Her family history includes Diabetes in her father. She  reports that she has never smoked. She has never used smokeless tobacco. She reports that she drank alcohol. She reports that she does not use drugs. She has a current medication list which includes the following prescription(s): ferrous sulfate, insulin aspart, insulin detemir, and prenatal multivitamin. She is allergic to sulfamethoxazole-trimethoprim and pravastatin.       Review of Systems:  Review of Systems  Constitutional: Denied constitutional symptoms, night sweats, recent illness, fatigue, fever, insomnia and weight loss.  Eyes: Denied eye symptoms, eye pain, photophobia, vision change and visual disturbance.  Ears/Nose/Throat/Neck: Denied ear, nose, throat or neck symptoms, hearing loss, nasal discharge, sinus congestion and sore throat.  Cardiovascular: Denied cardiovascular symptoms, arrhythmia, chest pain/pressure, edema, exercise intolerance, orthopnea and palpitations.  Respiratory: Denied pulmonary symptoms, asthma, pleuritic pain, productive sputum,  cough, dyspnea and wheezing.  Gastrointestinal: Denied, gastro-esophageal reflux, melena, nausea and vomiting.  Genitourinary: See HPI for additional information.  Musculoskeletal: Denied musculoskeletal symptoms, stiffness, swelling, muscle weakness and myalgia.  Dermatologic: Denied dermatology symptoms, rash and scar.  Neurologic: Denied neurology symptoms, dizziness, headache, neck pain and syncope.  Psychiatric: Denied psychiatric symptoms, anxiety and depression.  Endocrine: Denied endocrine symptoms including hot flashes and night sweats.   Meds:   Current Outpatient Medications on File Prior to Visit  Medication Sig Dispense Refill  . ferrous sulfate (FERROUSUL) 325 (65 FE) MG tablet Take 1 tablet (325 mg total) by mouth daily with breakfast. 60 tablet 1  . insulin aspart (NOVOLOG) 100 UNIT/ML injection Inject 8 Units into the skin 3 (three) times daily before meals. Sliding scale 10 mL 3  . insulin detemir (LEVEMIR) 100 UNIT/ML injection Inject 0.32 mLs (32 Units total) into the skin at bedtime. 10 mL 6  . Prenatal Vit-Fe Fumarate-FA (PRENATAL MULTIVITAMIN) TABS tablet Take 1 tablet by mouth daily at 12 noon.     No current facility-administered medications on file prior to visit.     Objective:     Vitals:   07/03/18 1104  BP: (!) 175/94  Pulse: 60  Temp: 98.4 F (36.9 C)  Repeat BP 158/98            Bilateral breast examination reveals engorgement.  No dominant masses no axillary adenopathy.  There are no areas with erythema.  Lower extremity examination reveals +2 edema with +1 reflexes on the right +3 on the left.  No clonus is present Patient's face and periorbital area seems slightly swollen/edematous.  Assessment:    G1P1001 Patient Active Problem List  Diagnosis Date Noted  . Pre-existing diabetes mellitus affecting childbirth 06/26/2018  . Elevated blood pressure affecting pregnancy in third trimester, antepartum 06/20/2018  . Labor and delivery  indication for care or intervention 06/20/2018  . PIH (pregnancy induced hypertension) 06/20/2018  . Gastroesophageal reflux in pregnancy 05/13/2018  . Type 1 diabetes mellitus without complication (HCC) 03/07/2018  . Generalized anxiety disorder 01/31/2018  . Immune deficiency disorder (HCC) 01/05/2018  . High risk pregnancy, antepartum 12/20/2017     1. Breast engorgement, obstetric, postpartum condition   2. PIH (pregnancy induced hypertension), unspecified trimester      Normal postpartum breast engorgement in the patient who is not breast-feeding.  Patient without significant blood pressure issues during the pregnancy however now postpartum seems to have an elevated BP.  Also seems to be retaining some fluid/edema.  My concern is that she may be developing as part of preeclampsia.   Plan:            1.  Discussed postpartum breast engorgement.  Tight bra, stop stimulating the breasts, expectant management.  2.  CBC, BMP, urine cath for protein creatinine ratio.  Based on results we will decide management. Possible hospital admission if necessary. Orders Orders Placed This Encounter  Procedures  . Protein / creatinine ratio, urine  . CBC with Differential/Platelet  . Basic metabolic panel    No orders of the defined types were placed in this encounter.     F/U  Return in about 2 days (around 07/05/2018).  [name], M.D. 07/03/2018 11:45 AM

## 2018-07-03 NOTE — Progress Notes (Signed)
Pt presents today with possible mastitis. Pt complains of hardness, soreness and fever in both breasts since Monday. Pt states she had a temperature of 100.8 on Monday.

## 2018-07-05 ENCOUNTER — Ambulatory Visit: Payer: Medicaid Other | Admitting: Obstetrics and Gynecology

## 2018-07-05 VITALS — BP 162/92 | HR 73 | Temp 98.6°F | Ht 66.0 in | Wt 192.0 lb

## 2018-07-05 DIAGNOSIS — N3090 Cystitis, unspecified without hematuria: Secondary | ICD-10-CM

## 2018-07-05 MED ORDER — NITROFURANTOIN MONOHYD MACRO 100 MG PO CAPS: 100.0000 mg | ORAL_CAPSULE | Freq: Two times a day (BID) | ORAL | 0 refills | Status: DC

## 2018-07-05 NOTE — Addendum Note (Signed)
Addended by: Haskel Khan on: 07/05/2018 04:06 PM   Modules accepted: Orders

## 2018-07-07 LAB — URINE CULTURE: Organism ID, Bacteria: NO GROWTH

## 2018-07-08 LAB — URINALYSIS, ROUTINE W REFLEX MICROSCOPIC
Bilirubin, UA: NEGATIVE
NITRITE UA: POSITIVE — AB
PH UA: 6 (ref 5.0–7.5)
Specific Gravity, UA: 1.025 (ref 1.005–1.030)
UUROB: 2 mg/dL — AB (ref 0.2–1.0)

## 2018-07-08 LAB — MICROSCOPIC EXAMINATION: RBC, UA: >30 /hpf — AB (ref 0–2)

## 2018-07-08 NOTE — Discharge Summary (Signed)
OB Discharge Summary     Patient Name: [name] DOB: 28-Oct-1996 MRN: [id number]  Date of admission: 06/25/2018 Delivering MD: Linzie Collin   Date of discharge: 06/29/2018  Admitting diagnosis: Type 1 Diabetes in pregnancy,  Intrauterine pregnancy: [redacted]w[redacted]d     Secondary diagnosis:  Gestational Hypertension, Contractions in pregnancy Additional problems: None     Discharge diagnosis: Term Pregnancy Delivered, Gestational Hypertension and Type 1 DM                                                                                                Post partum procedures:None  Augmentation: AROM, Pitocin and Cytotec  Complications: None  Hospital course:  Induction of Labor With Vaginal Delivery   22 y.o. yo G1P1001 at [redacted]w[redacted]d was admitted to the hospital 06/25/2018 for induction of labor.  Indication for induction: Gestational hypertension and Type 1 DM.  Patient had an uncomplicated labor course as follows: Membrane Rupture Time/Date: 8:00 AM ,06/27/2018   Intrapartum Procedures: Episiotomy: None [1]                                         Lacerations:  Labial [10]  Patient had delivery of a Viable infant.  Information for the patient's newborn:  Excell Seltzer [[id number]]  Delivery Method: Vag-Vacuum   06/27/2018  Details of delivery can be found in separate delivery note.  Patient had a routine postpartum course. Patient is discharged home 07/08/18.  Physical exam  Vitals:   06/28/18 1613 06/28/18 2333 06/29/18 0735 06/29/18 1549  BP: 123/70 129/77 128/69 (!) 144/89  Pulse: 89 82 74 87  Resp: 16 18 20 18   Temp: 98.3 F (36.8 C) 98 F (36.7 C) 97.8 F (36.6 C) 98 F (36.7 C)  TempSrc: Oral Oral Oral Oral  SpO2: 98% 98% 97% 98%  Weight:      Height:       General: alert and no distress Lochia: appropriate Uterine Fundus: firm Incision: N/A DVT Evaluation: No evidence of DVT seen on physical exam. Negative Homan's sign. No cords or calf tenderness. No  significant calf/ankle edema. Labs: Lab Results  Component Value Date   WBC 8.9 07/03/2018   HGB 9.7 (L) 07/03/2018   HCT 28.5 (L) 07/03/2018   MCV 86 07/03/2018   PLT 243 07/03/2018   CMP Latest Ref Rng & Units 07/03/2018  Glucose 65 - 99 mg/dL 782(N)  BUN 6 - 20 mg/dL 9  Creatinine 5.62 - 1.30 mg/dL 8.65  Sodium 784 - 696 mmol/L 142  Potassium 3.5 - 5.2 mmol/L 3.9  Chloride 96 - 106 mmol/L 109(H)  CO2 20 - 29 mmol/L 19(L)  Calcium 8.7 - 10.2 mg/dL 8.1(L)  Total Protein 6.0 - 8.5 g/dL -  Total Bilirubin 0.0 - 1.2 mg/dL -  Alkaline Phos 39 - 295 IU/L -  AST 0 - 40 IU/L -  ALT 0 - 32 IU/L -    Discharge instruction: per After Visit Summary and "Baby and Me Booklet".  After visit meds:  Allergies as of 06/29/2018      Reactions   Sulfamethoxazole-trimethoprim Hives   Pravastatin Other (See Comments)      Medication List    STOP taking these medications   aspirin EC 81 MG tablet   ranitidine 150 MG tablet Commonly known as:  ZANTAC     TAKE these medications   ferrous sulfate 325 (65 FE) MG tablet Take 1 tablet (325 mg total) by mouth daily with breakfast.   insulin aspart 100 UNIT/ML injection Commonly known as:  novoLOG Inject 8 Units into the skin 3 (three) times daily before meals. Sliding scale What changed:  how much to take   insulin detemir 100 UNIT/ML injection Commonly known as:  LEVEMIR Inject 0.32 mLs (32 Units total) into the skin at bedtime. What changed:  how much to take Notes to patient:  Bedtime dose needed, hold sliding scale   prenatal multivitamin Tabs tablet Take 1 tablet by mouth daily at 12 noon.       Diet: carb modified diet  Activity: Advance as tolerated. Pelvic rest for 6 weeks.   Outpatient follow up:1-2 weeks Follow up Appt:No future appointments. Follow up Visit:No follow-ups on file.  Postpartum contraception: IUD [place]  Newborn Data: Live born female  Birth Weight: 7 lb 11.1 oz (3490 g) APGAR: 7, 9  Newborn  Delivery   Birth date/time:  06/27/2018 23:22:00 Delivery type:  Vaginal, Vacuum (Extractor)     Baby Feeding: Bottle and Breast Disposition:home with mother   07/08/2018 Hildred Laser, MD

## 2018-07-25 LAB — SPECIMEN STATUS REPORT

## 2018-07-25 LAB — AST: AST: 17 IU/L (ref 0–40)

## 2018-07-25 LAB — ALT: ALT: 29 IU/L (ref 0–32)

## 2018-09-02 ENCOUNTER — Encounter: Payer: Medicaid Other | Admitting: Obstetrics and Gynecology

## 2018-09-02 ENCOUNTER — Encounter: Payer: Self-pay | Admitting: Obstetrics and Gynecology

## 2018-09-02 ENCOUNTER — Ambulatory Visit (INDEPENDENT_AMBULATORY_CARE_PROVIDER_SITE_OTHER): Payer: BC Managed Care – PPO | Admitting: Obstetrics and Gynecology

## 2018-09-02 VITALS — BP 151/98 | HR 89 | Ht 66.0 in | Wt 171.0 lb

## 2018-09-02 DIAGNOSIS — I10 Essential (primary) hypertension: Secondary | ICD-10-CM

## 2018-09-02 DIAGNOSIS — Z3009 Encounter for other general counseling and advice on contraception: Secondary | ICD-10-CM

## 2018-09-02 MED ORDER — LABETALOL HCL 100 MG PO TABS: 100.0000 mg | ORAL_TABLET | Freq: Two times a day (BID) | ORAL | 0 refills | Status: DC

## 2018-09-02 NOTE — Progress Notes (Signed)
HPI:      [name] is a 22 y.o. G1P1001 who LMP was Patient's last menstrual period was 08/21/2018 (exact date).  Subjective:   She presents today approximately 8 weeks postpartum.  She did not come for her postpartum check.  She presents today to discuss birth control.  She has no complaints.  She is not breast-feeding and has had a menses already.    Hx: The following portions of the patient's history were reviewed and updated as appropriate:             She  has a past medical history of Diabetes mellitus without complication (HCC), DKA (diabetic ketoacidoses) (HCC) (02/25/2017), Generalized anxiety disorder (01/31/2018), and Heart murmur. She does not have any pertinent problems on file. She  has a past surgical history that includes No past surgeries. Her family history includes Diabetes in her father. She  reports that she has never smoked. She has never used smokeless tobacco. She reports that she drank alcohol. She reports that she does not use drugs. She has a current medication list which includes the following prescription(s): ferrous sulfate, insulin aspart, insulin detemir, labetalol, nitrofurantoin (macrocrystal-monohydrate), and prenatal multivitamin. She is allergic to sulfamethoxazole-trimethoprim and pravastatin.       Review of Systems:  Review of Systems  Constitutional: Denied constitutional symptoms, night sweats, recent illness, fatigue, fever, insomnia and weight loss.  Eyes: Denied eye symptoms, eye pain, photophobia, vision change and visual disturbance.  Ears/Nose/Throat/Neck: Denied ear, nose, throat or neck symptoms, hearing loss, nasal discharge, sinus congestion and sore throat.  Cardiovascular: Denied cardiovascular symptoms, arrhythmia, chest pain/pressure, edema, exercise intolerance, orthopnea and palpitations.  Respiratory: Denied pulmonary symptoms, asthma, pleuritic pain, productive sputum, cough, dyspnea and wheezing.  Gastrointestinal: Denied,  gastro-esophageal reflux, melena, nausea and vomiting.  Genitourinary: Denied genitourinary symptoms including symptomatic vaginal discharge, pelvic relaxation issues, and urinary complaints.  Musculoskeletal: Denied musculoskeletal symptoms, stiffness, swelling, muscle weakness and myalgia.  Dermatologic: Denied dermatology symptoms, rash and scar.  Neurologic: Denied neurology symptoms, dizziness, headache, neck pain and syncope.  Psychiatric: Denied psychiatric symptoms, anxiety and depression.  Endocrine: Denied endocrine symptoms including hot flashes and night sweats.   Meds:   Current Outpatient Medications on File Prior to Visit  Medication Sig Dispense Refill  . ferrous sulfate (FERROUSUL) 325 (65 FE) MG tablet Take 1 tablet (325 mg total) by mouth daily with breakfast. 60 tablet 1  . insulin aspart (NOVOLOG) 100 UNIT/ML injection Inject 8 Units into the skin 3 (three) times daily before meals. Sliding scale 10 mL 3  . insulin detemir (LEVEMIR) 100 UNIT/ML injection Inject 0.32 mLs (32 Units total) into the skin at bedtime. 10 mL 6  . nitrofurantoin, macrocrystal-monohydrate, (MACROBID) 100 MG capsule Take 1 capsule (100 mg total) by mouth 2 (two) times daily. 14 capsule 0  . Prenatal Vit-Fe Fumarate-FA (PRENATAL MULTIVITAMIN) TABS tablet Take 1 tablet by mouth daily at 12 noon.     No current facility-administered medications on file prior to visit.     Objective:     Vitals:   09/02/18 1505  BP: (!) 151/98  Pulse: 89                Assessment:    G1P1001 Patient Active Problem List   Diagnosis Date Noted  . Pre-existing diabetes mellitus affecting childbirth 06/26/2018  . Elevated blood pressure affecting pregnancy in third trimester, antepartum 06/20/2018  . Labor and delivery indication for care or intervention 06/20/2018  . PIH (pregnancy  induced hypertension) 06/20/2018  . Gastroesophageal reflux in pregnancy 05/13/2018  . Type 1 diabetes mellitus without  complication (HCC) 03/07/2018  . Generalized anxiety disorder 01/31/2018  . Immune deficiency disorder (HCC) 01/05/2018  . High risk pregnancy, antepartum 12/20/2017     1. Birth control counseling   2. HTN (hypertension), benign     Patient continues to have hypertension.  It is late postpartum for her to continue to have pregnancy-induced hypertension.   Plan:            1.  We will treat hypertension with labetalol for 4 weeks.  We will then discontinue labetalol and if she continues to experience hypertension will consider long-term treatment.  2.  Birth Control I discussed multiple birth control options and methods with the patient.  The risks and benefits of each were reviewed. IUD Literature on Mirena given.  Risks and benefits discussed.  She is considering IUD as an option for birth/cycle control. Patient desires IUD.  Orders No orders of the defined types were placed in this encounter.    Meds ordered this encounter  Medications  . labetalol (NORMODYNE) 100 MG tablet    Sig: Take 1 tablet (100 mg total) by mouth 2 (two) times daily for 28 days. When stopping take 1 pill daily for the final 10 days    Dispense:  70 tablet    Refill:  0      F/U  Return for She is to call at the start of next menses.  [name], M.D. 09/02/2018 3:36 PM

## 2018-09-02 NOTE — Progress Notes (Signed)
Pt here today to discuss birth control. Pt requests the IUD. Pt also states that she never had a post partem visit. PHQ-9 Score: 3

## 2018-09-17 ENCOUNTER — Encounter: Payer: Self-pay | Admitting: Obstetrics and Gynecology

## 2018-09-17 ENCOUNTER — Ambulatory Visit (INDEPENDENT_AMBULATORY_CARE_PROVIDER_SITE_OTHER): Payer: BC Managed Care – PPO | Admitting: Obstetrics and Gynecology

## 2018-09-17 VITALS — BP 119/81 | HR 75 | Ht 66.0 in | Wt 174.0 lb

## 2018-09-17 DIAGNOSIS — Z30430 Encounter for insertion of intrauterine contraceptive device: Secondary | ICD-10-CM | POA: Diagnosis not present

## 2018-09-17 DIAGNOSIS — I10 Essential (primary) hypertension: Secondary | ICD-10-CM

## 2018-09-17 NOTE — Progress Notes (Signed)
HPI:      [name] is a 22 y.o. G1P1001 who LMP was Patient's last menstrual period was 09/12/2018 (exact date).  Subjective:   She presents today for IUD insertion.  Additionally she has been taking labetalol for hypertension.  She is just finishing her menses.    Hx: The following portions of the patient's history were reviewed and updated as appropriate:             She  has a past medical history of Diabetes mellitus without complication (HCC), DKA (diabetic ketoacidoses) (HCC) (02/25/2017), Generalized anxiety disorder (01/31/2018), and Heart murmur. She does not have any pertinent problems on file. She  has a past surgical history that includes No past surgeries. Her family history includes Diabetes in her father. She  reports that she has never smoked. She has never used smokeless tobacco. She reports that she drank alcohol. She reports that she does not use drugs. She has a current medication list which includes the following prescription(s): ferrous sulfate, insulin aspart, insulin detemir, and labetalol. She is allergic to sulfamethoxazole-trimethoprim and pravastatin.       Review of Systems:  Review of Systems  Constitutional: Denied constitutional symptoms, night sweats, recent illness, fatigue, fever, insomnia and weight loss.  Eyes: Denied eye symptoms, eye pain, photophobia, vision change and visual disturbance.  Ears/Nose/Throat/Neck: Denied ear, nose, throat or neck symptoms, hearing loss, nasal discharge, sinus congestion and sore throat.  Cardiovascular: Denied cardiovascular symptoms, arrhythmia, chest pain/pressure, edema, exercise intolerance, orthopnea and palpitations.  Respiratory: Denied pulmonary symptoms, asthma, pleuritic pain, productive sputum, cough, dyspnea and wheezing.  Gastrointestinal: Denied, gastro-esophageal reflux, melena, nausea and vomiting.  Genitourinary: Denied genitourinary symptoms including symptomatic vaginal discharge, pelvic  relaxation issues, and urinary complaints.  Musculoskeletal: Denied musculoskeletal symptoms, stiffness, swelling, muscle weakness and myalgia.  Dermatologic: Denied dermatology symptoms, rash and scar.  Neurologic: Denied neurology symptoms, dizziness, headache, neck pain and syncope.  Psychiatric: Denied psychiatric symptoms, anxiety and depression.  Endocrine: Denied endocrine symptoms including hot flashes and night sweats.   Meds:   Current Outpatient Medications on File Prior to Visit  Medication Sig Dispense Refill  . ferrous sulfate (FERROUSUL) 325 (65 FE) MG tablet Take 1 tablet (325 mg total) by mouth daily with breakfast. 60 tablet 1  . insulin aspart (NOVOLOG) 100 UNIT/ML injection Inject 8 Units into the skin 3 (three) times daily before meals. Sliding scale 10 mL 3  . insulin detemir (LEVEMIR) 100 UNIT/ML injection Inject 0.32 mLs (32 Units total) into the skin at bedtime. 10 mL 6  . labetalol (NORMODYNE) 100 MG tablet Take 1 tablet (100 mg total) by mouth 2 (two) times daily for 28 days. When stopping take 1 pill daily for the final 10 days 70 tablet 0   No current facility-administered medications on file prior to visit.     Objective:     Vitals:   09/17/18 1034  BP: 119/81  Pulse: 75    Physical examination   Pelvic:   Vulva: Normal appearance.  No lesions.  Vagina: No lesions or abnormalities noted.  Support: Normal pelvic support.  Urethra No masses tenderness or scarring.  Meatus Normal size without lesions or prolapse.  Cervix: Normal appearance.  No lesions.  Anus: Normal exam.  No lesions.  Perineum: Normal exam.  No lesions.        Bimanual   Uterus: Normal size.  Non-tender.  Mobile.  Retroverted  Adnexae: No masses.  Non-tender to palpation.  Cul-de-sac: Negative  for abnormality.   IUD Procedure Pt has read the booklet and signed the appropriate forms regarding the Mirena IUD.  All of her questions have been answered.   The cervix was cleansed  with betadine solution.  After sounding the uterus and noting the position, the IUD was placed in the usual manner without problem.  The string was cut to the appropriate length.  The patient tolerated the procedure well.              Assessment:    G1P1001 Patient Active Problem List   Diagnosis Date Noted  . Pre-existing diabetes mellitus affecting childbirth 06/26/2018  . Elevated blood pressure affecting pregnancy in third trimester, antepartum 06/20/2018  . Labor and delivery indication for care or intervention 06/20/2018  . PIH (pregnancy induced hypertension) 06/20/2018  . Gastroesophageal reflux in pregnancy 05/13/2018  . Type 1 diabetes mellitus without complication (HCC) 03/07/2018  . Generalized anxiety disorder 01/31/2018  . Immune deficiency disorder (HCC) 01/05/2018  . High risk pregnancy, antepartum 12/20/2017     1. Encounter for insertion of mirena IUD   2. HTN (hypertension), benign     Patient no longer has hypertension  Plan:             F/U  Return in about 4 weeks (around 10/15/2018) for For IUD f/u.   Patient will taper off of labetalol as directed and we will recheck her blood pressure at her 4-week follow-up for IUD.  [name], M.D. 09/17/2018 11:24 AM

## 2018-09-17 NOTE — Progress Notes (Signed)
Pt presents today for IUD insertion. Pt BP is doing better since last visit. 119/81

## 2018-10-15 ENCOUNTER — Ambulatory Visit (INDEPENDENT_AMBULATORY_CARE_PROVIDER_SITE_OTHER): Payer: BC Managed Care – PPO | Admitting: Obstetrics and Gynecology

## 2018-10-15 ENCOUNTER — Encounter: Payer: Self-pay | Admitting: Obstetrics and Gynecology

## 2018-10-15 VITALS — BP 144/87 | HR 105 | Ht 66.0 in | Wt 175.9 lb

## 2018-10-15 DIAGNOSIS — Z30431 Encounter for routine checking of intrauterine contraceptive device: Secondary | ICD-10-CM

## 2018-10-15 NOTE — Progress Notes (Signed)
HPI:      [name] is a 22 y.o. G1P1001 who LMP was Patient's last menstrual period was 10/12/2018.  Subjective:   She presents today for IUD follow-up.  She reports that she has bleeding off and on over the last month.  She reports she is otherwise doing well.  She did describe a recent UTI she had while on vacation but this resolved with medication.  She states they called her and told her it was "E. Coli". She reports no problems with intercourse into her IUD.    Hx: The following portions of the patient's history were reviewed and updated as appropriate:             She  has a past medical history of Diabetes mellitus without complication (HCC), DKA (diabetic ketoacidoses) (HCC) (02/25/2017), Generalized anxiety disorder (01/31/2018), and Heart murmur. She does not have any pertinent problems on file. She  has a past surgical history that includes No past surgeries. Her family history includes Diabetes in her father. She  reports that she has never smoked. She has never used smokeless tobacco. She reports previous alcohol use. She reports that she does not use drugs. She has a current medication list which includes the following prescription(s): insulin aspart and insulin detemir. She is allergic to sulfamethoxazole-trimethoprim and pravastatin.       Review of Systems:  Review of Systems  Constitutional: Denied constitutional symptoms, night sweats, recent illness, fatigue, fever, insomnia and weight loss.  Eyes: Denied eye symptoms, eye pain, photophobia, vision change and visual disturbance.  Ears/Nose/Throat/Neck: Denied ear, nose, throat or neck symptoms, hearing loss, nasal discharge, sinus congestion and sore throat.  Cardiovascular: Denied cardiovascular symptoms, arrhythmia, chest pain/pressure, edema, exercise intolerance, orthopnea and palpitations.  Respiratory: Denied pulmonary symptoms, asthma, pleuritic pain, productive sputum, cough, dyspnea and wheezing.   Gastrointestinal: Denied, gastro-esophageal reflux, melena, nausea and vomiting.  Genitourinary: Denied genitourinary symptoms including symptomatic vaginal discharge, pelvic relaxation issues, and urinary complaints.  Musculoskeletal: Denied musculoskeletal symptoms, stiffness, swelling, muscle weakness and myalgia.  Dermatologic: Denied dermatology symptoms, rash and scar.  Neurologic: Denied neurology symptoms, dizziness, headache, neck pain and syncope.  Psychiatric: Denied psychiatric symptoms, anxiety and depression.  Endocrine: Denied endocrine symptoms including hot flashes and night sweats.   Meds:   Current Outpatient Medications on File Prior to Visit  Medication Sig Dispense Refill  . insulin aspart (NOVOLOG) 100 UNIT/ML injection Inject 8 Units into the skin 3 (three) times daily before meals. Sliding scale 10 mL 3  . insulin detemir (LEVEMIR) 100 UNIT/ML injection Inject 0.32 mLs (32 Units total) into the skin at bedtime. 10 mL 6   No current facility-administered medications on file prior to visit.     Objective:     Vitals:   10/15/18 1414  BP: (!) 144/87  Pulse: (!) 105              Physical examination   Pelvic:   Vulva: Normal appearance.  No lesions.  Vagina: No lesions or abnormalities noted.  Support: Normal pelvic support.  Urethra No masses tenderness or scarring.  Meatus Normal size without lesions or prolapse.  Cervix: Normal appearance.  No lesions. IUD strings noted at cervical os and shortened  Anus: Normal exam.  No lesions.  Perineum: Normal exam.  No lesions.        Bimanual   Uterus: Normal size.  Non-tender.  Mobile.  AV.  Adnexae: No masses.  Non-tender to palpation.  Cul-de-sac: Negative for  abnormality.     Assessment:    G1P1001 Patient Active Problem List   Diagnosis Date Noted  . Pre-existing diabetes mellitus affecting childbirth 06/26/2018  . Elevated blood pressure affecting pregnancy in third trimester, antepartum  06/20/2018  . Labor and delivery indication for care or intervention 06/20/2018  . PIH (pregnancy induced hypertension) 06/20/2018  . Gastroesophageal reflux in pregnancy 05/13/2018  . Type 1 diabetes mellitus without complication (HCC) 03/07/2018  . Generalized anxiety disorder 01/31/2018  . Immune deficiency disorder (HCC) 01/05/2018  . High risk pregnancy, antepartum 12/20/2017     I 1. Surveillance of previously prescribed intrauterine contraceptive device     Patient doing well with IUD   Plan:            1.  If spotting continues over the next 2 weeks consider 1 pack of OCPs for relief of bleeding. Orders No orders of the defined types were placed in this encounter.   No orders of the defined types were placed in this encounter.     F/U  Return in about 6 months (around 04/16/2019) for Annual Physical. I spent 17 minutes involved in the care of this patient of which greater than 50% was spent discussing IUD, breakthrough bleeding, UTI, use of OCPs to temporarily stop breakthrough bleeding annual exam.  All questions answered.  Elonda Huskyavid J. Wojciech Willetts, M.D. 10/15/2018 2:48 PM

## 2018-11-07 ENCOUNTER — Other Ambulatory Visit: Payer: Self-pay | Admitting: Obstetrics and Gynecology

## 2018-11-07 DIAGNOSIS — I10 Essential (primary) hypertension: Secondary | ICD-10-CM

## 2018-11-08 ENCOUNTER — Other Ambulatory Visit: Payer: Self-pay | Admitting: Obstetrics and Gynecology

## 2018-11-08 DIAGNOSIS — I10 Essential (primary) hypertension: Secondary | ICD-10-CM

## 2019-03-18 ENCOUNTER — Other Ambulatory Visit: Payer: Self-pay | Admitting: Obstetrics and Gynecology

## 2019-03-18 NOTE — Telephone Encounter (Signed)
Please see if patient has established with either a primary care provider or endocrinologist for her diabetes, and if not, she needs a referral. If she has not been established, we can send in a refill for 1 month for her but we will need to verify her dosage.  Dr. Valentino Saxon

## 2019-03-31 NOTE — Telephone Encounter (Signed)
Pt called no answer LM to call the office to speak more about her medication refill.

## 2019-04-08 NOTE — Telephone Encounter (Signed)
Pt called no answer LM via voicemail to call the office to speak more about her refill.

## 2019-04-08 NOTE — Telephone Encounter (Signed)
Pt called a man answered the phone and stated that she was not there. Asked him to please have pt call the office. Man stated that it maybe a week before he saw her again.

## 2019-04-22 ENCOUNTER — Encounter: Payer: BC Managed Care – PPO | Admitting: Obstetrics and Gynecology

## 2019-06-16 NOTE — Telephone Encounter (Signed)
If we can get in touch with her, we need to find out if she is still seeing her Endocrinologist who should be managing this.   Dr. Marcelline Mates

## 2019-06-19 NOTE — Telephone Encounter (Signed)
Pt called and a man answered the phone and stated that she was not there. Asked him to reach out to pt and ask her to call the office.

## 2019-06-19 NOTE — Telephone Encounter (Signed)
Pt called no answer LM via VM to call the office. 

## 2019-10-29 NOTE — Telephone Encounter (Signed)
Sent pt a mychart message. 

## 2019-11-05 ENCOUNTER — Inpatient Hospital Stay: Payer: BC Managed Care – PPO

## 2019-11-05 ENCOUNTER — Emergency Department: Payer: BC Managed Care – PPO

## 2019-11-05 ENCOUNTER — Inpatient Hospital Stay
Admission: EM | Admit: 2019-11-05 | Discharge: 2019-11-08 | DRG: 638 | Disposition: A | Discharge status: Home / Self Care | Payer: BC Managed Care – PPO | Attending: Internal Medicine | Admitting: Internal Medicine

## 2019-11-05 ENCOUNTER — Other Ambulatory Visit: Payer: Self-pay

## 2019-11-05 ENCOUNTER — Encounter: Payer: Self-pay | Admitting: Emergency Medicine

## 2019-11-05 ENCOUNTER — Inpatient Hospital Stay: Payer: Self-pay

## 2019-11-05 DIAGNOSIS — K219 Gastro-esophageal reflux disease without esophagitis: Secondary | ICD-10-CM | POA: Diagnosis present

## 2019-11-05 DIAGNOSIS — Z794 Long term (current) use of insulin: Secondary | ICD-10-CM | POA: Diagnosis not present

## 2019-11-05 DIAGNOSIS — I471 Supraventricular tachycardia: Secondary | ICD-10-CM | POA: Diagnosis present

## 2019-11-05 DIAGNOSIS — Z882 Allergy status to sulfonamides status: Secondary | ICD-10-CM | POA: Diagnosis not present

## 2019-11-05 DIAGNOSIS — N179 Acute kidney failure, unspecified: Secondary | ICD-10-CM | POA: Diagnosis present

## 2019-11-05 DIAGNOSIS — B9620 Unspecified Escherichia coli [E. coli] as the cause of diseases classified elsewhere: Secondary | ICD-10-CM | POA: Diagnosis present

## 2019-11-05 DIAGNOSIS — N390 Urinary tract infection, site not specified: Secondary | ICD-10-CM | POA: Diagnosis present

## 2019-11-05 DIAGNOSIS — Z20822 Contact with and (suspected) exposure to covid-19: Secondary | ICD-10-CM | POA: Diagnosis present

## 2019-11-05 DIAGNOSIS — E871 Hypo-osmolality and hyponatremia: Secondary | ICD-10-CM | POA: Diagnosis present

## 2019-11-05 DIAGNOSIS — A059 Bacterial foodborne intoxication, unspecified: Secondary | ICD-10-CM | POA: Diagnosis present

## 2019-11-05 DIAGNOSIS — Z833 Family history of diabetes mellitus: Secondary | ICD-10-CM

## 2019-11-05 DIAGNOSIS — E872 Acidosis: Secondary | ICD-10-CM | POA: Diagnosis not present

## 2019-11-05 DIAGNOSIS — E876 Hypokalemia: Secondary | ICD-10-CM | POA: Diagnosis not present

## 2019-11-05 DIAGNOSIS — Z888 Allergy status to other drugs, medicaments and biological substances status: Secondary | ICD-10-CM

## 2019-11-05 DIAGNOSIS — E1010 Type 1 diabetes mellitus with ketoacidosis without coma: Principal | ICD-10-CM | POA: Diagnosis present

## 2019-11-05 DIAGNOSIS — E875 Hyperkalemia: Secondary | ICD-10-CM | POA: Diagnosis present

## 2019-11-05 LAB — HEMOGLOBIN A1C
Hgb A1c MFr Bld: 7.1 % — ABNORMAL HIGH (ref 4.8–5.6)
Mean Plasma Glucose: 157.07 mg/dL

## 2019-11-05 LAB — CBC WITH DIFFERENTIAL/PLATELET
Abs Immature Granulocytes: 0.56 10*3/uL — ABNORMAL HIGH (ref 0.00–0.07)
Basophils Absolute: 0.1 10*3/uL (ref 0.0–0.1)
Basophils Relative: 0 %
Eosinophils Absolute: 0 10*3/uL (ref 0.0–0.5)
Eosinophils Relative: 0 %
HCT: 38 % (ref 36.0–46.0)
Hemoglobin: 11.9 g/dL — ABNORMAL LOW (ref 12.0–15.0)
Immature Granulocytes: 2 %
Lymphocytes Relative: 6 %
Lymphs Abs: 1.7 10*3/uL (ref 0.7–4.0)
MCH: 30.3 pg (ref 26.0–34.0)
MCHC: 31.3 g/dL (ref 30.0–36.0)
MCV: 96.7 fL (ref 80.0–100.0)
Monocytes Absolute: 1.2 10*3/uL — ABNORMAL HIGH (ref 0.1–1.0)
Monocytes Relative: 4 %
Neutro Abs: 25.6 10*3/uL — ABNORMAL HIGH (ref 1.7–7.7)
Neutrophils Relative %: 88 %
Platelets: 549 10*3/uL — ABNORMAL HIGH (ref 150–400)
RBC: 3.93 MIL/uL (ref 3.87–5.11)
RDW: 12.5 % (ref 11.5–15.5)
Smear Review: NORMAL
WBC: 29.2 10*3/uL — ABNORMAL HIGH (ref 4.0–10.5)
nRBC: 0 % (ref 0.0–0.2)

## 2019-11-05 LAB — COMPREHENSIVE METABOLIC PANEL
ALT: 21 U/L (ref 0–44)
AST: 20 U/L (ref 15–41)
Albumin: 5.1 g/dL — ABNORMAL HIGH (ref 3.5–5.0)
Alkaline Phosphatase: 116 U/L (ref 38–126)
BUN: 43 mg/dL — ABNORMAL HIGH (ref 6–20)
CO2: <7 mmol/L — ABNORMAL LOW (ref 22–32)
Calcium: 9.3 mg/dL (ref 8.9–10.3)
Chloride: 92 mmol/L — ABNORMAL LOW (ref 98–111)
Creatinine, Ser: 2.13 mg/dL — ABNORMAL HIGH (ref 0.44–1.00)
GFR calc Af Amer: 36 mL/min — ABNORMAL LOW (ref >60)
GFR calc non Af Amer: 31 mL/min — ABNORMAL LOW (ref >60)
Glucose, Bld: 911 mg/dL (ref 70–99)
Potassium: 6.3 mmol/L (ref 3.5–5.1)
Sodium: 127 mmol/L — ABNORMAL LOW (ref 135–145)
Total Bilirubin: 1.8 mg/dL — ABNORMAL HIGH (ref 0.3–1.2)
Total Protein: 8.8 g/dL — ABNORMAL HIGH (ref 6.5–8.1)

## 2019-11-05 LAB — MAGNESIUM: Magnesium: 2.2 mg/dL (ref 1.7–2.4)

## 2019-11-05 LAB — URINALYSIS, ROUTINE W REFLEX MICROSCOPIC
Bilirubin Urine: NEGATIVE
Glucose, UA: >=500 mg/dL — AB
Ketones, ur: 80 mg/dL — AB
Leukocytes,Ua: NEGATIVE
Nitrite: NEGATIVE
Protein, ur: NEGATIVE mg/dL
Specific Gravity, Urine: 1.016 (ref 1.005–1.030)
pH: 5 (ref 5.0–8.0)

## 2019-11-05 LAB — GLUCOSE, CAPILLARY
Glucose-Capillary: >600 mg/dL (ref 70–99)
Glucose-Capillary: >600 mg/dL (ref 70–99)
Glucose-Capillary: 578 mg/dL (ref 70–99)
Glucose-Capillary: >600 mg/dL (ref 70–99)
Glucose-Capillary: 542 mg/dL (ref 70–99)
Glucose-Capillary: 594 mg/dL (ref 70–99)
Glucose-Capillary: 418 mg/dL — ABNORMAL HIGH (ref 70–99)
Glucose-Capillary: 489 mg/dL — ABNORMAL HIGH (ref 70–99)
Glucose-Capillary: 303 mg/dL — ABNORMAL HIGH (ref 70–99)
Glucose-Capillary: 277 mg/dL — ABNORMAL HIGH (ref 70–99)
Glucose-Capillary: 205 mg/dL — ABNORMAL HIGH (ref 70–99)
Glucose-Capillary: 141 mg/dL — ABNORMAL HIGH (ref 70–99)

## 2019-11-05 LAB — BASIC METABOLIC PANEL
Anion gap: 17 — ABNORMAL HIGH (ref 5–15)
BUN: 38 mg/dL — ABNORMAL HIGH (ref 6–20)
CO2: 8 mmol/L — ABNORMAL LOW (ref 22–32)
Calcium: 9.4 mg/dL (ref 8.9–10.3)
Chloride: 110 mmol/L (ref 98–111)
Creatinine, Ser: 1.78 mg/dL — ABNORMAL HIGH (ref 0.44–1.00)
GFR calc Af Amer: 45 mL/min — ABNORMAL LOW (ref >60)
GFR calc non Af Amer: 39 mL/min — ABNORMAL LOW (ref >60)
Glucose, Bld: 414 mg/dL — ABNORMAL HIGH (ref 70–99)
Potassium: 5 mmol/L (ref 3.5–5.1)
Sodium: 135 mmol/L (ref 135–145)

## 2019-11-05 LAB — RESPIRATORY PANEL BY RT PCR (FLU A&B, COVID)
Influenza A by PCR: NEGATIVE
Influenza B by PCR: NEGATIVE
SARS Coronavirus 2 by RT PCR: NEGATIVE

## 2019-11-05 LAB — TSH: TSH: 0.253 u[IU]/mL — ABNORMAL LOW (ref 0.350–4.500)

## 2019-11-05 LAB — BLOOD GAS, VENOUS
Acid-base deficit: 21.7 mmol/L — ABNORMAL HIGH (ref 0.0–2.0)
Bicarbonate: 6.2 mmol/L — ABNORMAL LOW (ref 20.0–28.0)
FIO2: 0.21
O2 Saturation: 71.8 %
Patient temperature: 37
pCO2, Ven: 20 mmHg — ABNORMAL LOW (ref 44.0–60.0)
pH, Ven: 7.1 — CL (ref 7.250–7.430)
pO2, Ven: 53 mmHg — ABNORMAL HIGH (ref 32.0–45.0)

## 2019-11-05 LAB — LACTIC ACID, PLASMA
Lactic Acid, Venous: 5.1 mmol/L (ref 0.5–1.9)
Lactic Acid, Venous: 2.8 mmol/L (ref 0.5–1.9)

## 2019-11-05 LAB — PREGNANCY, URINE: Preg Test, Ur: NEGATIVE

## 2019-11-05 LAB — BETA-HYDROXYBUTYRIC ACID: Beta-Hydroxybutyric Acid: 6.47 mmol/L — ABNORMAL HIGH (ref 0.05–0.27)

## 2019-11-05 LAB — PROTIME-INR
INR: 1.2 (ref 0.8–1.2)
Prothrombin Time: 15.3 seconds — ABNORMAL HIGH (ref 11.4–15.2)

## 2019-11-05 LAB — HIV ANTIBODY (ROUTINE TESTING W REFLEX): HIV Screen 4th Generation wRfx: NONREACTIVE

## 2019-11-05 LAB — POCT PREGNANCY, URINE: Preg Test, Ur: NEGATIVE

## 2019-11-05 LAB — PHOSPHORUS: Phosphorus: 2.6 mg/dL (ref 2.5–4.6)

## 2019-11-05 MED ORDER — INSULIN REGULAR(HUMAN) IN NACL 100-0.9 UT/100ML-% IV SOLN
INTRAVENOUS | Status: DC
  Administered 2019-11-05: 4.2 [IU]/h via INTRAVENOUS
  Administered 2019-11-05: 15:00:00 10.5 [IU]/h via INTRAVENOUS
  Filled 2019-11-05 (×2): qty 100

## 2019-11-05 MED ORDER — SODIUM CHLORIDE 0.9 % IV SOLN: Freq: Once | INTRAVENOUS | Status: AC

## 2019-11-05 MED ORDER — MORPHINE SULFATE (PF) 2 MG/ML IV SOLN
2.0000 mg | INTRAVENOUS | Status: DC | PRN
  Administered 2019-11-05: 2 mg via INTRAVENOUS
  Filled 2019-11-05: qty 1

## 2019-11-05 MED ORDER — SODIUM CHLORIDE 0.9 % IV SOLN
1.0000 g | Freq: Once | INTRAVENOUS | Status: DC
  Filled 2019-11-05: qty 10

## 2019-11-05 MED ORDER — INSULIN REGULAR(HUMAN) IN NACL 100-0.9 UT/100ML-% IV SOLN
INTRAVENOUS | Status: DC
  Administered 2019-11-05: 8.5 [IU]/h via INTRAVENOUS
  Filled 2019-11-05: qty 100

## 2019-11-05 MED ORDER — ENOXAPARIN SODIUM 40 MG/0.4ML ~~LOC~~ SOLN
40.0000 mg | SUBCUTANEOUS | Status: DC
  Administered 2019-11-05 – 2019-11-07 (×3): 40 mg via SUBCUTANEOUS
  Filled 2019-11-05 (×3): qty 0.4

## 2019-11-05 MED ORDER — ADENOSINE 6 MG/2ML IV SOLN
INTRAVENOUS | Status: AC
  Administered 2019-11-05: 6 mg via INTRAVENOUS
  Filled 2019-11-05: qty 2

## 2019-11-05 MED ORDER — ADENOSINE 6 MG/2ML IV SOLN: 6.0000 mg | Freq: Once | INTRAVENOUS | Status: AC

## 2019-11-05 MED ORDER — DEXTROSE 50 % IV SOLN
0.0000 mL | INTRAVENOUS | Status: DC | PRN
  Administered 2019-11-06: 50 mL via INTRAVENOUS
  Filled 2019-11-05: qty 50

## 2019-11-05 MED ORDER — PIPERACILLIN-TAZOBACTAM 3.375 G IVPB 30 MIN
3.3750 g | Freq: Once | INTRAVENOUS | Status: AC
  Administered 2019-11-05: 3.375 g via INTRAVENOUS
  Filled 2019-11-05: qty 50

## 2019-11-05 MED ORDER — DEXTROSE-NACL 5-0.45 % IV SOLN: INTRAVENOUS | Status: DC

## 2019-11-05 MED ORDER — VANCOMYCIN HCL IN DEXTROSE 1-5 GM/200ML-% IV SOLN
1000.0000 mg | Freq: Once | INTRAVENOUS | Status: AC
  Administered 2019-11-05: 1000 mg via INTRAVENOUS
  Filled 2019-11-05: qty 200

## 2019-11-05 MED ORDER — ADENOSINE 12 MG/4ML IV SOLN: 12.0000 mg | Freq: Once | INTRAVENOUS | Status: DC

## 2019-11-05 MED ORDER — MORPHINE SULFATE (PF) 4 MG/ML IV SOLN
4.0000 mg | Freq: Once | INTRAVENOUS | Status: AC
  Administered 2019-11-05: 4 mg via INTRAVENOUS
  Filled 2019-11-05: qty 1

## 2019-11-05 MED ORDER — SODIUM CHLORIDE 0.9 % IV BOLUS
1000.0000 mL | Freq: Once | INTRAVENOUS | Status: AC
  Administered 2019-11-05: 1000 mL via INTRAVENOUS

## 2019-11-05 MED ORDER — SODIUM CHLORIDE 0.9 % IV SOLN: INTRAVENOUS | Status: DC

## 2019-11-05 MED ORDER — ADENOSINE 12 MG/4ML IV SOLN
INTRAVENOUS | Status: AC
  Filled 2019-11-05: qty 4

## 2019-11-05 MED ORDER — ONDANSETRON HCL 4 MG/2ML IJ SOLN
4.0000 mg | Freq: Once | INTRAMUSCULAR | Status: AC
  Administered 2019-11-05: 4 mg via INTRAVENOUS
  Filled 2019-11-05: qty 2

## 2019-11-05 MED ORDER — CALCIUM CHLORIDE 10 % IV SOLN
1.0000 g | Freq: Once | INTRAVENOUS | Status: AC
  Administered 2019-11-05: 12:00:00 1 g via INTRAVENOUS

## 2019-11-05 MED ORDER — CALCIUM CHLORIDE 10 % IV SOLN
INTRAVENOUS | Status: AC
  Filled 2019-11-05: qty 10

## 2019-11-05 NOTE — ED Notes (Signed)
Attempted to call report to ICU, was told RN would call back

## 2019-11-05 NOTE — ED Notes (Signed)
poct pregnancy Negative 

## 2019-11-05 NOTE — ED Notes (Signed)
Pt requesting pain medication for flank pain

## 2019-11-05 NOTE — ED Notes (Signed)
Attempted to call report, was told RN would call back  

## 2019-11-05 NOTE — ED Notes (Signed)
Pt resting at this time watching TV

## 2019-11-05 NOTE — ED Notes (Signed)
Pharmacy contacted to send insulin drip

## 2019-11-05 NOTE — ED Notes (Signed)
Lab at bedside

## 2019-11-05 NOTE — ED Notes (Signed)
Cliffton Asters, NP aware of difficulty getting lab work on pt

## 2019-11-05 NOTE — ED Notes (Signed)
Pt noted tp be 171 bpm SVT rhythm on the monitor. MD Mayford Knife at bedside  Verbal order for 6mg  adenosine.  1118: 6mg  adenosine administered by Alex,RN HR maintains 163 bpm: verbal order for pressure bags for normal saline 2 liters at this time

## 2019-11-05 NOTE — ED Triage Notes (Signed)
Pt reports has been having issues with her kidneys and have been on 2 different meds for UTI and last night got worse and now vomiting and back pain. Pt pale in triage.

## 2019-11-05 NOTE — ED Notes (Signed)
Lab called to draw blood.  Unable to pull labs from iv's.  Pt has had multiple sticks.

## 2019-11-05 NOTE — ED Notes (Signed)
Resumed care from [facility].  Pt alert.  Watching tv.  Pt waiting on admission.

## 2019-11-05 NOTE — H&P (Addendum)
History and Physical:    [name]   BMW:[id number] DOB: [birth year] DOA: 11/05/2019  Referring MD/provider: Daryel November, MD PCP: Jaclyn Shaggy, MD   Patient coming from: Home  Chief Complaint: Vomiting, abdominal pain and back pain  History of Present Illness:   [name] is an 24 y.o. female with medical history significant for type 1 diabetes mellitus, history of DKA, generalized anxiety disorder, who presented to the hospital with vomiting, abdominal pain and back pain.  She said she had UTI about 2 weeks ago and she was prescribed Bactrim.  However, she developed hives about 2 days into treatment with Bactrim so she discontinued the medicine.  She was then prescribed Macrobid which she only tookfor about 2 days.  She said she went to a restaurant about 4 days ago where she ate undercooked crab and shrimp and she started vomiting that night.  She believes the undercooked food caused her to vomit.  She thinks she vomited all day yesterday but then later she also said she was not sure whether she vomited all day yesterday.  She is unable to provide details about her symptoms and she's unsure of timing of events and she attributes this to "the morphine" that was given in the emergency room.  She does not report any fever but she thinks she still has some dysuria.  She is not sure whether she passed urine this morning.  She said her last hemoglobin A1c at her doctor's office was around 6.5 in October 2020.  She says she takes maximum of 30 units of Levemir at night depending on her blood sugar and she also takes NovoLog with meals depending on how much carbs she eats during the day.  ED Course:  The patient found to have severe hyperglycemia (glucose 911), elevated anion gap, severe metabolic acidosis with venous pH of 7.10 and arterial bicarbonate of less than 7.  BUN was 43, creatinine was 2.14, sodium 127 and potassium was 6.3.  WBC was also elevated at 29.2.  She was given 3 L of  normal saline in the ED and started on IV insulin infusion.  She was also given IV adenosine for SVT.  She was given IV morphine for pain  ROS:   ROS all other systems reviewed were negative  Past Medical History:   Past Medical History:  Diagnosis Date  . Diabetes mellitus without complication (HCC)   . DKA (diabetic ketoacidoses) (HCC) 02/25/2017  . Generalized anxiety disorder 01/31/2018  . Heart murmur    at birth    Past Surgical History:   Past Surgical History:  Procedure Laterality Date  . NO PAST SURGERIES      Social History:   Social History   Socioeconomic History  . Marital status: Single    Spouse name: Not on file  . Number of children: Not on file  . Years of education: Not on file  . Highest education level: Not on file  Occupational History  . Not on file  Tobacco Use  . Smoking status: Never Smoker  . Smokeless tobacco: Never Used  Substance and Sexual Activity  . Alcohol use: Not Currently  . Drug use: Never  . Sexual activity: Yes    Birth control/protection: I.U.D.  Other Topics Concern  . Not on file  Social History Narrative  . Not on file   Social Determinants of Health   Financial Resource Strain:   . Difficulty of Paying Living Expenses: Not on file  Food Insecurity:   . Worried About Charity fundraiser in the Last Year: Not on file  . Ran Out of Food in the Last Year: Not on file  Transportation Needs:   . Lack of Transportation (Medical): Not on file  . Lack of Transportation (Non-Medical): Not on file  Physical Activity:   . Days of Exercise per Week: Not on file  . Minutes of Exercise per Session: Not on file  Stress:   . Feeling of Stress : Not on file  Social Connections:   . Frequency of Communication with Friends and Family: Not on file  . Frequency of Social Gatherings with Friends and Family: Not on file  . Attends Religious Services: Not on file  . Active Member of Clubs or Organizations: Not on file  . Attends  Archivist Meetings: Not on file  . Marital Status: Not on file  Intimate Partner Violence:   . Fear of Current or Ex-Partner: Not on file  . Emotionally Abused: Not on file  . Physically Abused: Not on file  . Sexually Abused: Not on file    Allergies   Sulfamethoxazole-trimethoprim and Pravastatin  Family history:   Family History  Problem Relation Age of Onset  . Diabetes Father   . Cancer Neg Hx     Current Medications:   Prior to Admission medications   Medication Sig Start Date End Date Taking? Authorizing Provider  insulin detemir (LEVEMIR) 100 UNIT/ML injection Inject 0.32 mLs (32 Units total) into the skin at bedtime. Patient taking differently: Inject 29 Units into the skin at bedtime. If needed 06/29/18  Yes Rubie Maid, MD  nitrofurantoin, macrocrystal-monohydrate, (MACROBID) 100 MG capsule Take 100 mg by mouth 2 (two) times daily. 11/02/19  Yes [provider]  NOVOLOG FLEXPEN 100 UNIT/ML FlexPen INJECT 8 UNITS INTO THE SKIN 3 (THREE) TIMES DAILY BEFORE MEALS. SLIDING SCALE 05/28/19  Yes Rubie Maid, MD  Phenazopyridine HCl (AZO TABS PO) Take by mouth daily.   Yes [provider]    Physical Exam:   Vitals:   11/05/19 1130 11/05/19 1200 11/05/19 1230 11/05/19 1300  BP: (!) 126/51 (!) 104/43 (!) 120/59 131/81  Pulse:  (!) 156 (!) 139 (!) 151  Resp: (!) 23 16 (!) 21 (!) 23  Temp:      TempSrc:      SpO2:  100% 100% 99%  Weight:      Height:         Physical Exam: Blood pressure 131/81, pulse (!) 151, temperature 99.1 F (37.3 C), temperature source Oral, resp. rate (!) 23, height 5\' 6"  (1.676 m), weight 83.5 kg, SpO2 99 %, not currently breastfeeding. Gen: No acute distress. Head: Normocephalic, atraumatic. Eyes: Pupils equal, round and reactive to light. Extraocular movements intact.  Sclerae nonicteric.  Mouth: Dry mucous membranes. Neck: Supple, no thyromegaly, no lymphadenopathy, no jugular venous distention. Chest:  Lungs are clear to auscultation with good air movement. No rales, rhonchi or wheezes.  CV: Heart sounds are regular but tachycardic with an S1, S2. No murmurs, rubs, or gallops heard.  Abdomen: Soft, generalized abdominal tenderness without rebound tenderness or guarding, nondistended with normal active bowel sounds. No palpable masses. Extremities: Extremities are without clubbing, or cyanosis. No edema. Pedal pulses 2+.  Skin: Warm and dry. No rashes, lesions or wounds. Neuro: Alert and oriented times 3; grossly nonfocal.  Psych: Insight is good and judgment is appropriate. Mood and affect normal. GU: No costovertebral angle tenderness  Data Review:    Labs: Basic Metabolic Panel: Recent Labs  Lab 11/05/19 1015  NA 127*  K 6.3*  CL 92*  CO2 <7*  GLUCOSE 911*  BUN 43*  CREATININE 2.13*  CALCIUM 9.3   Liver Function Tests: Recent Labs  Lab 11/05/19 1015  AST 20  ALT 21  ALKPHOS 116  BILITOT 1.8*  PROT 8.8*  ALBUMIN 5.1*   No results for input(s): LIPASE, AMYLASE in the last 168 hours. No results for input(s): AMMONIA in the last 168 hours. CBC: Recent Labs  Lab 11/05/19 1015  WBC 29.2*  NEUTROABS 25.6*  HGB 11.9*  HCT 38.0  MCV 96.7  PLT 549*   Cardiac Enzymes: No results for input(s): CKTOTAL, CKMB, CKMBINDEX, TROPONINI in the last 168 hours.  BNP (last 3 results) No results for input(s): PROBNP in the last 8760 hours. CBG: Recent Labs  Lab 11/05/19 1217 11/05/19 1259 11/05/19 1332 11/05/19 1405 11/05/19 1439  GLUCAP >600* >600* >600* 578* 594*    Urinalysis    Component Value Date/Time   COLORURINE YELLOW (A) 11/05/2019 0945   APPEARANCEUR HAZY (A) 11/05/2019 0945   APPEARANCEUR Cloudy (A) 07/05/2018 1626   LABSPEC 1.016 11/05/2019 0945   LABSPEC 1.019 03/18/2014 1714   PHURINE 5.0 11/05/2019 0945   GLUCOSEU >=500 (A) 11/05/2019 0945   GLUCOSEU 50 mg/dL 94/76/5465 0354   HGBUR SMALL (A) 11/05/2019 0945   BILIRUBINUR NEGATIVE 11/05/2019  0945   BILIRUBINUR Negative 07/05/2018 1626   BILIRUBINUR Negative 03/18/2014 1714   KETONESUR 80 (A) 11/05/2019 0945   PROTEINUR NEGATIVE 11/05/2019 0945   UROBILINOGEN 0.2 06/24/2018 1356   NITRITE NEGATIVE 11/05/2019 0945   LEUKOCYTESUR NEGATIVE 11/05/2019 0945   LEUKOCYTESUR 2+ 03/18/2014 1714      Radiographic Studies: DG Chest 1 View  Result Date: 11/05/2019 CLINICAL DATA:  Dyspnea EXAM: CHEST  1 VIEW COMPARISON:  02/25/2017 FINDINGS: The heart size and mediastinal contours are within normal limits. Both lungs are clear. The visualized skeletal structures are unremarkable. IMPRESSION: No active disease. Electronically Signed   By: Marlan Palau M.D.   On: 11/05/2019 13:18    EKG: Independently reviewed.  Sinus tachycardia, no acute ST-T changes.  Telemetry showed SVT and subsequently sinus tachycardia after she was given adenosine   Assessment/Plan:   Principal Problem:   DKA, type 1 (HCC) Active Problems:   AKI (acute kidney injury) (HCC)   Hyperkalemia   Body mass index is 29.7 kg/m.    Severe DKA, type I: Admit to ICU.  Treat with IV insulin infusion and IV fluids and monitor glucose and BMP per DKA protocol.    Status post SVT: Patient received 2 doses of IV adenosine (total of 18 mg) in the emergency room.  Monitor on telemetry.  Check TSH, magnesium and phosphorus levels.    Acute kidney injury complicated by hyperkalemia: Treat with IV fluids and monitor BMP.  Potassium level is expected to improve with hydration and insulin.  Severe leukocytosis: This may be reactive or could be due to infection (UTI).  Treat with empiric IV antibiotics and follow-up blood and urine cultures.  Abnormal urinalysis: Treat with empiric IV Zosyn and follow-up urine and blood cultures.  Doubt sepsis.  Lactic acidosis is likely due to DKA rather than sepsis.  Lactate has improved from 5.1-2.8.  Hyperglycemia induced hyponatremia: Expected to improve with improvement in  glucose levels.  Continue IV fluids and monitor BMP    Other information:   DVT prophylaxis: Lovenox Code Status: Full  code. Family Communication: Plan discussed with the patient  Disposition Plan: Possible discharge to home in 2 to 3 days Consults called: None Admission status: Inpatient   The medical decision making on this patient was of high complexity and the patient is at high risk for clinical deterioration, therefore this is a level 3 visit.   Total time spent 60 minutes  Zeno Hickel [facility]   How to contact the [facility] Attending or Consulting provider 7A - 7P or covering provider during after hours 7P -7A, for this patient?   1. Check the care team in [facility] and look for a) attending/consulting [facility] provider listed and b) the [facility] team listed 2. Log into [email or web address] and use [place]'s universal password to access. If you do not have the password, please contact the hospital operator. 3. Locate the [facility] provider you are looking for under [facility] and page to a number that you can be directly reached. 4. If you still have difficulty reaching the provider, please page the [facility] (Director on Call) for the Hospitalists listed on amion for assistance.  11/05/2019, 3:03 PM

## 2019-11-05 NOTE — Progress Notes (Signed)
Inpatient Diabetes Program Recommendations  AACE/ADA: New Consensus Statement on Inpatient Glycemic Control (2015)  Target Ranges:  Prepandial:   less than 140 mg/dL      Peak postprandial:   less than 180 mg/dL (1-2 hours)      Critically ill patients:  140 - 180 mg/dL   Lab Results  Component Value Date   GLUCAP >600 (HH) 11/05/2019   HGBA1C 8.1 (H) 02/26/2017      Diabetes history: T1D Outpatient Diabetes medications: Levemir 28 units daily; Novolog 1 unit/3 carbohydrates; Novolog SSI 1 unit for every 50 above 150 Current orders for Inpatient glycemic control: IV Insulin   Inpatient Diabetes Program Recommendations:     -When acidosis clears and patient is ready to  transition off of IV insulin please consider Levemir 25 units (0.3/kg).   -Novolog 0-9 Q4H   Note: In Chart Review, patient's last office visit with Dr. Gershon Crane was 08/06/19.  He decreased her Levemir to 28 units daily from 30 units daily due to frequent lows. Patient does not check her blood sugars; has used the [place] in the past.  Will follow patient and plan to see on 11/05/18.    Thank you, Zerita Boers, RN, BSN Diabetes Coordinator Inpatient Diabetes Program [phone] (team pager from 8a-5p)

## 2019-11-05 NOTE — ED Notes (Signed)
Lab stuck pt x2 with no success

## 2019-11-05 NOTE — ED Notes (Signed)
Admitting MD at bedside.

## 2019-11-05 NOTE — ED Provider Notes (Addendum)
[facility] Emergency Department Provider Note       Time seen: ----------------------------------------- 9:40 AM on 11/05/2019 -----------------------------------------   I have reviewed the triage vital signs and the nursing notes.  HISTORY   Chief Complaint Nausea, Chest Pain, and Back Pain    HPI [name] is a 24 y.o. female with a history of diabetes, DKA, anxiety who presents to the ED for vomiting or back pain.  Patient reports having issues with her kidneys and she has been on 2 different antibiotics for UTI.  Last night she states her symptoms got worse resulting in the vomiting and back pain.  She presents with 9 out of 10 mid back pain that is aching.  Past Medical History:  Diagnosis Date  . Diabetes mellitus without complication (HCC)   . DKA (diabetic ketoacidoses) (HCC) 02/25/2017  . Generalized anxiety disorder 01/31/2018  . Heart murmur    at birth    Patient Active Problem List   Diagnosis Date Noted  . Pre-existing diabetes mellitus affecting childbirth 06/26/2018  . Elevated blood pressure affecting pregnancy in third trimester, antepartum 06/20/2018  . Labor and delivery indication for care or intervention 06/20/2018  . PIH (pregnancy induced hypertension) 06/20/2018  . Gastroesophageal reflux in pregnancy 05/13/2018  . Type 1 diabetes mellitus without complication (HCC) 03/07/2018  . Generalized anxiety disorder 01/31/2018  . Immune deficiency disorder (HCC) 01/05/2018  . High risk pregnancy, antepartum 12/20/2017    Past Surgical History:  Procedure Laterality Date  . NO PAST SURGERIES      Allergies Sulfamethoxazole-trimethoprim and Pravastatin  Social History Social History   Tobacco Use  . Smoking status: Never Smoker  . Smokeless tobacco: Never Used  Substance Use Topics  . Alcohol use: Not Currently  . Drug use: Never    Review of Systems Constitutional: Negative for fever. Cardiovascular: Negative  for chest pain. Respiratory: Negative for shortness of breath. Gastrointestinal: Negative for abdominal pain, positive for vomiting Musculoskeletal: Positive for back pain Skin: Negative for rash. Neurological: Negative for headaches, focal weakness or numbness.  All systems negative/normal/unremarkable except as stated in the HPI  ____________________________________________   PHYSICAL EXAM:  VITAL SIGNS: ED Triage Vitals  Enc Vitals Group     BP 11/05/19 0929 126/63     Pulse Rate 11/05/19 0929 (!) 146     Resp 11/05/19 0929 18     Temp 11/05/19 0929 99.1 F (37.3 C)     Temp Source 11/05/19 0929 Oral     SpO2 11/05/19 0929 98 %     Weight 11/05/19 0929 184 lb (83.5 kg)     Height 11/05/19 0929 5\' 6"  (1.676 m)     Head Circumference --      Peak Flow --      Pain Score 11/05/19 0936 9     Pain Loc --      Pain Edu? --      Excl. in GC? --    Constitutional: Alert and oriented.  Mild distress Eyes: Conjunctivae are normal. Normal extraocular movements. ENT      Head: Normocephalic and atraumatic.      Nose: No congestion/rhinnorhea.      Mouth/Throat: Mucous membranes are moist.      Neck: No stridor. Cardiovascular: Rapid rate, regular rhythm. No murmurs, rubs, or gallops. Respiratory: Normal respiratory effort without tachypnea nor retractions. Breath sounds are clear and equal bilaterally. No wheezes/rales/rhonchi. Gastrointestinal: Soft and nontender. Normal bowel sounds Musculoskeletal: Nontender with normal range of  motion in extremities. No lower extremity tenderness nor edema. Neurologic:  Normal speech and language. No gross focal neurologic deficits are appreciated.  Skin:  Skin is warm, dry and intact. No rash noted. Psychiatric: Mood and affect are normal. Speech and behavior are normal.  ____________________________________________  EKG: Interpreted by me.  Sinus tachycardia with rate of 140 bpm, normal PR interval, normal axis, normal  QT  ____________________________________________  ED COURSE:  As part of my medical decision making, I reviewed the following data within the electronic MEDICAL RECORD NUMBER History obtained from family if available, nursing notes, old chart and ekg, as well as notes from prior ED visits. Patient presented for back pain and vomiting, we will assess with labs and imaging as indicated at this time.   Procedures  [name] was evaluated in Emergency Department on 11/05/2019 for the symptoms described in the history of present illness. She was evaluated in the context of the global COVID-19 pandemic, which necessitated consideration that the patient might be at risk for infection with the SARS-CoV-2 virus that causes COVID-19. Institutional protocols and algorithms that pertain to the evaluation of patients at risk for COVID-19 are in a state of rapid change based on information released by regulatory bodies including the CDC and federal and state organizations. These policies and algorithms were followed during the patient's care in the ED.  ____________________________________________   LABS (pertinent positives/negatives)  Labs Reviewed  LACTIC ACID, PLASMA - Abnormal; Notable for the following components:      Result Value   Lactic Acid, Venous 5.1 (*)    All other components within normal limits  COMPREHENSIVE METABOLIC PANEL - Abnormal; Notable for the following components:   Sodium 127 (*)    Potassium 6.3 (*)    Chloride 92 (*)    CO2 <7 (*)    Glucose, Bld 911 (*)    BUN 43 (*)    Creatinine, Ser 2.13 (*)    Total Protein 8.8 (*)    Albumin 5.1 (*)    Total Bilirubin 1.8 (*)    GFR calc non Af Amer 31 (*)    GFR calc Af Amer 36 (*)    All other components within normal limits  CBC WITH DIFFERENTIAL/PLATELET - Abnormal; Notable for the following components:   WBC 29.2 (*)    Hemoglobin 11.9 (*)    Platelets 549 (*)    Neutro Abs 25.6 (*)    Monocytes Absolute 1.2 (*)    Abs  Immature Granulocytes 0.56 (*)    All other components within normal limits  PROTIME-INR - Abnormal; Notable for the following components:   Prothrombin Time 15.3 (*)    All other components within normal limits  BLOOD GAS, VENOUS - Abnormal; Notable for the following components:   pH, Ven 7.10 (*)    pCO2, Ven 20 (*)    pO2, Ven 53.0 (*)    Bicarbonate 6.2 (*)    Acid-base deficit 21.7 (*)    All other components within normal limits  GLUCOSE, CAPILLARY - Abnormal; Notable for the following components:   Glucose-Capillary >600 (*)    All other components within normal limits  CULTURE, BLOOD (ROUTINE X 2)  CULTURE, BLOOD (ROUTINE X 2)  URINE CULTURE  SARS CORONAVIRUS 2 (TAT 6-24 HRS)  LACTIC ACID, PLASMA  URINALYSIS, ROUTINE W REFLEX MICROSCOPIC  POC URINE PREG, ED   CRITICAL CARE Performed by: Ulice Dash   Total critical care time: 60 minutes  Critical care time  was exclusive of separately billable procedures and treating other patients.  Critical care was necessary to treat or prevent imminent or life-threatening deterioration.  Critical care was time spent personally by me on the following activities: development of treatment plan with patient and/or surrogate as well as nursing, discussions with consultants, evaluation of patient's response to treatment, examination of patient, obtaining history from patient or surrogate, ordering and performing treatments and interventions, ordering and review of laboratory studies, ordering and review of radiographic studies, pulse oximetry and re-evaluation of patient's condition.  RADIOLOGY  Chest x-ray Is unremarkable CT renal protocol ____________________________________________   DIFFERENTIAL DIAGNOSIS   Sepsis, DKA, pyelonephritis, UTI, COVID-19, dehydration, electrolyte abnormality  FINAL ASSESSMENT AND PLAN  DKA, possible sepsis, SVT   Plan: The patient had presented for flank pain with vomiting.  Patient  looks sick on arrival, he was started on IV fluids, antiemetics and broad-spectrum antibiotics when we discovered her white blood cell count was 29,000.  She was acidemic with elevated lactic acid level, had leukocytosis patient's imaging is still pending at this time.  My suspicion would be UTI or pyelonephritis that has triggered diabetic ketoacidosis.  She is severely acidemic with an elevated lactic acid level.  We have given her 3 L of IV fluid and started her on insulin drip.  She also received IV calcium for her high potassium.  She had runs of SVT with a heart rate approaching 200.  We gave adenosine but her heart rate would revert back to SVT after a brief run of sinus tachycardia.  This seemed to improve after the calcium and fluids.   Laurence Aly, MD    Note: This note was generated in part or whole with voice recognition software. Voice recognition is usually quite accurate but there are transcription errors that can and very often do occur. I apologize for any typographical errors that were not detected and corrected.     Earleen Newport, MD 11/05/19 1230    Earleen Newport, MD 11/05/19 1323

## 2019-11-05 NOTE — ED Notes (Signed)
Insulin increased to 12 units.  Pt alert  Watching tv.

## 2019-11-05 NOTE — ED Notes (Signed)
Report off to [place] rn

## 2019-11-05 NOTE — ED Notes (Signed)
Pt repeatedly asking for ice chips despite c/o nausea and intermittently hiccuping/gagging. This RN explained due to patient's VS and patient's repeated hiccuping/gagging that she could not have ice chips, also explained given patient nausea medication and pt was going for CT scan and would need to wait until after CT scan before patient could have ice chips. Pt states "I just feel like I can't swallow when my throat is so dry". This RN reinforced teaching with patient. Explained this RN and Trinna Post, RN would return to bedside to check on patient status pt states, "in how many minutes?" This RN explained would return after checking on other patients. Pt states understanding.

## 2019-11-05 NOTE — ED Notes (Signed)
Lab called to come stick pt for labs d/t pt being a hard stick

## 2019-11-05 NOTE — ED Notes (Signed)
Pt given ice chips. Ok per Manuela Schwartz, NP

## 2019-11-06 ENCOUNTER — Encounter: Payer: Self-pay | Admitting: Internal Medicine

## 2019-11-06 DIAGNOSIS — E875 Hyperkalemia: Secondary | ICD-10-CM

## 2019-11-06 DIAGNOSIS — N179 Acute kidney failure, unspecified: Secondary | ICD-10-CM

## 2019-11-06 DIAGNOSIS — E1010 Type 1 diabetes mellitus with ketoacidosis without coma: Principal | ICD-10-CM

## 2019-11-06 DIAGNOSIS — K219 Gastro-esophageal reflux disease without esophagitis: Secondary | ICD-10-CM

## 2019-11-06 DIAGNOSIS — E872 Acidosis: Secondary | ICD-10-CM

## 2019-11-06 LAB — BASIC METABOLIC PANEL
Anion gap: 10 (ref 5–15)
Anion gap: 12 (ref 5–15)
BUN: 35 mg/dL — ABNORMAL HIGH (ref 6–20)
BUN: 35 mg/dL — ABNORMAL HIGH (ref 6–20)
CO2: 16 mmol/L — ABNORMAL LOW (ref 22–32)
CO2: 14 mmol/L — ABNORMAL LOW (ref 22–32)
Calcium: 9.3 mg/dL (ref 8.9–10.3)
Calcium: 9.1 mg/dL (ref 8.9–10.3)
Chloride: 113 mmol/L — ABNORMAL HIGH (ref 98–111)
Chloride: 114 mmol/L — ABNORMAL HIGH (ref 98–111)
Creatinine, Ser: 1.43 mg/dL — ABNORMAL HIGH (ref 0.44–1.00)
Creatinine, Ser: 1.19 mg/dL — ABNORMAL HIGH (ref 0.44–1.00)
GFR calc Af Amer: 59 mL/min — ABNORMAL LOW (ref >60)
GFR calc Af Amer: >60 mL/min (ref >60)
GFR calc non Af Amer: 51 mL/min — ABNORMAL LOW (ref >60)
GFR calc non Af Amer: >60 mL/min (ref >60)
Glucose, Bld: 111 mg/dL — ABNORMAL HIGH (ref 70–99)
Glucose, Bld: 93 mg/dL (ref 70–99)
Potassium: 4.2 mmol/L (ref 3.5–5.1)
Potassium: 4.1 mmol/L (ref 3.5–5.1)
Sodium: 139 mmol/L (ref 135–145)
Sodium: 140 mmol/L (ref 135–145)

## 2019-11-06 LAB — GLUCOSE, CAPILLARY
Glucose-Capillary: 100 mg/dL — ABNORMAL HIGH (ref 70–99)
Glucose-Capillary: 103 mg/dL — ABNORMAL HIGH (ref 70–99)
Glucose-Capillary: 133 mg/dL — ABNORMAL HIGH (ref 70–99)
Glucose-Capillary: 141 mg/dL — ABNORMAL HIGH (ref 70–99)
Glucose-Capillary: 223 mg/dL — ABNORMAL HIGH (ref 70–99)
Glucose-Capillary: 53 mg/dL — ABNORMAL LOW (ref 70–99)
Glucose-Capillary: 54 mg/dL — ABNORMAL LOW (ref 70–99)
Glucose-Capillary: 56 mg/dL — ABNORMAL LOW (ref 70–99)
Glucose-Capillary: 70 mg/dL (ref 70–99)
Glucose-Capillary: 79 mg/dL (ref 70–99)
Glucose-Capillary: 96 mg/dL (ref 70–99)
Glucose-Capillary: 98 mg/dL (ref 70–99)

## 2019-11-06 LAB — SARS CORONAVIRUS 2 (TAT 6-24 HRS): SARS Coronavirus 2: NEGATIVE

## 2019-11-06 LAB — URINE CULTURE: Culture: <10000 — AB

## 2019-11-06 LAB — MAGNESIUM: Magnesium: 2 mg/dL (ref 1.7–2.4)

## 2019-11-06 LAB — BETA-HYDROXYBUTYRIC ACID
Beta-Hydroxybutyric Acid: 1.62 mmol/L — ABNORMAL HIGH (ref 0.05–0.27)
Beta-Hydroxybutyric Acid: 3.19 mmol/L — ABNORMAL HIGH (ref 0.05–0.27)

## 2019-11-06 LAB — MRSA PCR SCREENING: MRSA by PCR: NEGATIVE

## 2019-11-06 LAB — T4, FREE: Free T4: 1.11 ng/dL (ref 0.61–1.12)

## 2019-11-06 MED ORDER — INSULIN GLARGINE 100 UNIT/ML ~~LOC~~ SOLN
15.0000 [IU] | Freq: Two times a day (BID) | SUBCUTANEOUS | Status: DC
  Filled 2019-11-06: qty 0.15

## 2019-11-06 MED ORDER — ALUM & MAG HYDROXIDE-SIMETH 200-200-20 MG/5ML PO SUSP
15.0000 mL | ORAL | Status: DC | PRN
  Administered 2019-11-06: 15 mL via ORAL
  Filled 2019-11-06: qty 30

## 2019-11-06 MED ORDER — INSULIN ASPART 100 UNIT/ML ~~LOC~~ SOLN
0.0000 [IU] | SUBCUTANEOUS | Status: DC
  Administered 2019-11-06: 16:00:00 3 [IU] via SUBCUTANEOUS
  Administered 2019-11-06: 1 [IU] via SUBCUTANEOUS
  Administered 2019-11-07: 3 [IU] via SUBCUTANEOUS
  Administered 2019-11-07 – 2019-11-08 (×4): 2 [IU] via SUBCUTANEOUS
  Administered 2019-11-08: 3 [IU] via SUBCUTANEOUS
  Filled 2019-11-06 (×8): qty 1

## 2019-11-06 MED ORDER — CHLORHEXIDINE GLUCONATE CLOTH 2 % EX PADS
6.0000 | MEDICATED_PAD | Freq: Every day | CUTANEOUS | Status: DC
  Administered 2019-11-07 – 2019-11-08 (×2): 6 via TOPICAL
  Filled 2019-11-06: qty 6

## 2019-11-06 MED ORDER — PANTOPRAZOLE SODIUM 40 MG PO TBEC
40.0000 mg | DELAYED_RELEASE_TABLET | Freq: Every day | ORAL | Status: DC
  Administered 2019-11-06 – 2019-11-08 (×3): 40 mg via ORAL
  Filled 2019-11-06 (×3): qty 1

## 2019-11-06 MED ORDER — DEXTROSE 50 % IV SOLN: 40.0000 mL | Freq: Once | INTRAVENOUS | Status: DC

## 2019-11-06 MED ORDER — INSULIN ASPART 100 UNIT/ML ~~LOC~~ SOLN
4.0000 [IU] | Freq: Three times a day (TID) | SUBCUTANEOUS | Status: DC
  Administered 2019-11-06 (×3): 4 [IU] via SUBCUTANEOUS
  Filled 2019-11-06 (×3): qty 1

## 2019-11-06 MED ORDER — INSULIN DETEMIR 100 UNIT/ML ~~LOC~~ SOLN: 32.0000 [IU] | Freq: Every day | SUBCUTANEOUS | Status: DC

## 2019-11-06 MED ORDER — INSULIN ASPART 100 UNIT/ML ~~LOC~~ SOLN: 0.0000 [IU] | Freq: Three times a day (TID) | SUBCUTANEOUS | Status: DC

## 2019-11-06 MED ORDER — INSULIN DETEMIR 100 UNIT/ML ~~LOC~~ SOLN
25.0000 [IU] | Freq: Two times a day (BID) | SUBCUTANEOUS | Status: DC
  Administered 2019-11-06: 25 [IU] via SUBCUTANEOUS
  Filled 2019-11-06 (×3): qty 0.25

## 2019-11-06 MED ORDER — INSULIN GLARGINE 100 UNIT/ML ~~LOC~~ SOLN: 32.0000 [IU] | Freq: Every day | SUBCUTANEOUS | Status: DC

## 2019-11-06 MED ORDER — DEXTROSE 50 % IV SOLN
INTRAVENOUS | Status: AC
  Filled 2019-11-06: qty 50

## 2019-11-06 MED ORDER — SODIUM CHLORIDE 0.9 % IV SOLN: INTRAVENOUS | Status: DC

## 2019-11-06 MED ORDER — ONDANSETRON HCL 4 MG/2ML IJ SOLN
4.0000 mg | Freq: Four times a day (QID) | INTRAMUSCULAR | Status: DC | PRN
  Administered 2019-11-06 – 2019-11-07 (×6): 4 mg via INTRAVENOUS
  Filled 2019-11-06 (×6): qty 2

## 2019-11-06 MED ORDER — INSULIN DETEMIR 100 UNIT/ML ~~LOC~~ SOLN
15.0000 [IU] | Freq: Two times a day (BID) | SUBCUTANEOUS | Status: DC
  Administered 2019-11-06: 15 [IU] via SUBCUTANEOUS
  Filled 2019-11-06 (×2): qty 0.15

## 2019-11-06 NOTE — Progress Notes (Addendum)
Inpatient Diabetes Program Recommendations  AACE/ADA: New Consensus Statement on Inpatient Glycemic Control (2015)  Target Ranges:  Prepandial:   less than 140 mg/dL      Peak postprandial:   less than 180 mg/dL (1-2 hours)      Critically ill patients:  140 - 180 mg/dL   Lab Results  Component Value Date   GLUCAP 100 (H) 11/06/2019   HGBA1C 7.1 (H) 11/05/2019    Review of Glycemic Control  Results for [name], [name] (MRN [id number]) as of 11/06/2019 08:42  Ref. Range 11/06/2019 01:32 11/06/2019 02:03 11/06/2019 02:11 11/06/2019 03:39 11/06/2019 05:05 11/06/2019 07:14 11/06/2019 07:39  Glucose-Capillary Latest Ref Range: 70 - 99 mg/dL 70 56 ([name])  79 98  545 (H)    Diabetes history: T1D Outpatient Diabetes medications: Levemir 28 units daily; Novolog 1 unit/3 carbohydrates; Novolog SSI 1 unit for every 50 above 150 Current orders for Inpatient glycemic control: Novolog 0-20 TID with meals; Levemir 15 BID; Novolog 4 units with meals  Inpatient Diabetes Program Recommendations:     Noted that patient did not receive basal when transitioned off of drip yesterday.  CBG's 70-100 with 56mg /dl at .  Patient is sensitive to insulin.    -Please consider Levemir 25 units QHS -Novolog 0-9 units Q4 -Novolog 4 units with meals TID  Addendum: Spoke with patient at bedside.  She was diagnosed with T1D at age 24.  She sees Dr. 12 every 3 mos.  Last saw him in October.  Lantus was decreased from 30 units daily to 28 units daily due to several lows at that apointment.  Patient states she believes she got food poisoning from a seafood restaurant and was vomiting off and on for 24hrs prior to admission.  She did take her Lantus and some Novolog with very small amounts of food.  She denies difficulty obtaining medications and she administers her insulins as prescribed and rotates sights.  She said she was too weak to check her sugar when she was sick.  Educated patient on importance of checking blood sugar every  1-2 hours when sick. She has used the [place] in the past.  Encouraged her to try to get the [place] again if possible.  Will continue to follow.  Thank you, Josephine Igo, RN, BSN Diabetes Coordinator Inpatient Diabetes Program [phone] (team pager from 8a-5p)

## 2019-11-06 NOTE — Progress Notes (Addendum)
PROGRESS NOTE    [name]  ZHY:[id number] DOB: [birth year] DOA: 11/05/2019 PCP: Jaclyn Shaggy, MD    Brief Narrative:  [name] is an 24 y.o. female with medical history significant for type 1 diabetes mellitus, history of DKA, generalized anxiety disorder, who presented to the hospital with vomiting, abdominal pain and back pain.  She said she had UTI about 2 weeks ago and she was prescribed Bactrim.  However, she developed hives about 2 days into treatment with Bactrim so she discontinued the medicine.  She was then prescribed Macrobid which she only tookfor about 2 days.  She said she went to a restaurant about 4 days ago where she ate undercooked crab and shrimp and she started vomiting that night.  She believes the undercooked food caused her to vomit.  The patient found to have severe hyperglycemia (glucose 911), elevated anion gap, severe metabolic acidosis with venous pH of 7.10 and arterial bicarbonate of less than 7.  BUN was 43, creatinine was 2.14, sodium 127 and potassium was 6.3.  WBC was also elevated at 29.2.  She was given 3 L of normal saline in the ED and started on IV insulin infusion.  She was also given IV adenosine for SVT, now sinus tach    Consultants:   Diabetic eduacator  Procedures: none  Antimicrobials:   none    Subjective: Patient seen and examined.  She feels much better.  Denies abdominal pain, nausea or vomiting.feels acid reflux and requesting something for that Objective: Vitals:   11/06/19 0800 11/06/19 0900 11/06/19 1000 11/06/19 1100  BP: 131/83 (!) 152/97 (!) 153/100 (!) 141/86  Pulse: (!) 108 (!) 109 99 97  Resp: 15 12 15 16   Temp: 98.4 F (36.9 C)     TempSrc: Oral     SpO2: 96% 99% 98% 96%  Weight:      Height:        Intake/Output Summary (Last 24 hours) at 11/06/2019 1613 Last data filed at 11/06/2019 0600 Gross per 24 hour  Intake 1406.58 ml  Output 1000 ml  Net 406.58 ml   Filed Weights   11/05/19 0929  Weight: 83.5 kg      Examination:  General exam: Appears calm and comfortable , nad  Respiratory system: Clear to auscultation. Respiratory effort normal. Cardiovascular system: S1 & S2 heard, RRR. No JVD, murmurs, rubs, gallops or clicks.  Gastrointestinal system: Abdomen is nondistended, soft and nontender. Normal bowel sounds heard. Central nervous system: Alert and oriented. No focal neurological deficits. Extremities: no edema Skin: warm and dry Psychiatry: Judgement and insight appear normal. Mood & affect appropriate.     Data Reviewed: I have personally reviewed following labs and imaging studies  CBC: Recent Labs  Lab 11/05/19 1015  WBC 29.2*  NEUTROABS 25.6*  HGB 11.9*  HCT 38.0  MCV 96.7  PLT 549*   Basic Metabolic Panel: Recent Labs  Lab 11/05/19 1015 11/05/19 1747 11/06/19 0057 11/06/19 0714  NA 127* 135 139 140  K 6.3* 5.0 4.2 4.1  CL 92* 110 113* 114*  CO2 <7* 8* 16* 14*  GLUCOSE 911* 414* 111* 93  BUN 43* 38* 35* 35*  CREATININE 2.13* 1.78* 1.43* 1.19*  CALCIUM 9.3 9.4 9.3 9.1  MG  --  2.2  --  2.0  PHOS  --  2.6  --   --    GFR: Estimated Creatinine Clearance: 78.7 mL/min (A) (by C-G formula based on SCr of 1.19 mg/dL (H)). Liver  Function Tests: Recent Labs  Lab 11/05/19 1015  AST 20  ALT 21  ALKPHOS 116  BILITOT 1.8*  PROT 8.8*  ALBUMIN 5.1*   No results for input(s): LIPASE, AMYLASE in the last 168 hours. No results for input(s): AMMONIA in the last 168 hours. Coagulation Profile: Recent Labs  Lab 11/05/19 1015  INR 1.2   Cardiac Enzymes: No results for input(s): CKTOTAL, CKMB, CKMBINDEX, TROPONINI in the last 168 hours. BNP (last 3 results) No results for input(s): PROBNP in the last 8760 hours. HbA1C: Recent Labs    11/05/19 1747  HGBA1C 7.1*   CBG: Recent Labs  Lab 11/06/19 0339 11/06/19 0505 11/06/19 0739 11/06/19 1136 11/06/19 1551  GLUCAP 79 98 100* 141* 223*   Lipid Profile: No results for input(s): CHOL, HDL, LDLCALC,  TRIG, CHOLHDL, LDLDIRECT in the last 72 hours. Thyroid Function Tests: Recent Labs    11/05/19 1747 11/06/19 0714  TSH 0.253*  --   FREET4  --  1.11   Anemia Panel: No results for input(s): VITAMINB12, FOLATE, FERRITIN, TIBC, IRON, RETICCTPCT in the last 72 hours. Sepsis Labs: Recent Labs  Lab 11/05/19 1015 11/05/19 1255  LATICACIDVEN 5.1* 2.8*    Recent Results (from the past 240 hour(s))  Blood Culture (routine x 2)     Status: None (Preliminary result)   Collection Time: 11/05/19 10:15 AM   Specimen: BLOOD  Result Value Ref Range Status   Specimen Description BLOOD RIGHT AC  Final   Special Requests   Final    BOTTLES DRAWN AEROBIC AND ANAEROBIC Blood Culture results may not be optimal due to an excessive volume of blood received in culture bottles   Culture   Final    NO GROWTH < 24 HOURS Performed at [facility], [address] [place], [place] [address]    Report Status PENDING  Incomplete  Blood Culture (routine x 2)     Status: None (Preliminary result)   Collection Time: 11/05/19 10:15 AM   Specimen: BLOOD  Result Value Ref Range Status   Specimen Description BLOOD LEFT AC  Final   Special Requests   Final    BOTTLES DRAWN AEROBIC AND ANAEROBIC Blood Culture adequate volume   Culture   Final    NO GROWTH < 24 HOURS Performed at [facility], [address], [place], [place] [address]    Report Status PENDING  Incomplete  Urine culture     Status: Abnormal   Collection Time: 11/05/19 10:15 AM   Specimen: In/Out Cath Urine  Result Value Ref Range Status   Specimen Description   Final    IN/OUT CATH URINE Performed at [facility], [address] [place], [place] [address]    Special Requests   Final    NONE Performed at [facility], [address] [place], [place] [address]    Culture (A)  Final    <10,000 COLONIES/mL INSIGNIFICANT GROWTH Performed at [facility], [address], [place], [place]  [address]    Report Status 11/06/2019 FINAL  Final  SARS CORONAVIRUS 2 (TAT 6-24 HRS) Nasopharyngeal Nasopharyngeal Swab     Status: None   Collection Time: 11/05/19 12:55 PM   Specimen: Nasopharyngeal Swab  Result Value Ref Range Status   SARS Coronavirus 2 NEGATIVE NEGATIVE Final    Comment: (NOTE) SARS-CoV-2 target nucleic acids are NOT DETECTED. The SARS-CoV-2 RNA is generally detectable in upper and lower respiratory specimens during the acute phase of infection. Negative  results do not preclude SARS-CoV-2 infection, do not rule out co-infections with other pathogens, and should not be used as the sole basis for treatment or other patient management decisions. Negative results must be combined with clinical observations, patient history, and epidemiological information. The expected result is Negative. Fact Sheet for Patients: HairSlick.no Fact Sheet for Healthcare Providers: quierodirigir.com This test is not yet approved or cleared by the [place] FDA and  has been authorized for detection and/or diagnosis of SARS-CoV-2 by FDA under an Emergency Use Authorization (EUA). This EUA will remain  in effect (meaning this test can be used) for the duration of the COVID-19 declaration under Section 56 4(b)(1) of the Act, 21 U.S.C. section 360bbb-3(b)(1), unless the authorization is terminated or revoked sooner. Performed at [facility], [address], [place], [place] [address]   Respiratory Panel by RT PCR (Flu A&B, Covid) - Nasopharyngeal Swab     Status: None   Collection Time: 11/05/19 10:46 PM   Specimen: Nasopharyngeal Swab  Result Value Ref Range Status   SARS Coronavirus 2 by RT PCR NEGATIVE NEGATIVE Final    Comment: (NOTE) SARS-CoV-2 target nucleic acids are NOT DETECTED. The SARS-CoV-2 RNA is generally detectable in upper respiratoy specimens during the acute phase of infection. The lowest concentration  of SARS-CoV-2 viral copies this assay can detect is 131 copies/mL. A negative result does not preclude SARS-Cov-2 infection and should not be used as the sole basis for treatment or other patient management decisions. A negative result may occur with  improper specimen collection/handling, submission of specimen other than nasopharyngeal swab, presence of viral mutation(s) within the areas targeted by this assay, and inadequate number of viral copies (<131 copies/mL). A negative result must be combined with clinical observations, patient history, and epidemiological information. The expected result is Negative. Fact Sheet for Patients:  https://www.moore.com/ Fact Sheet for Healthcare Providers:  https://www.young.biz/ This test is not yet ap proved or cleared by the [place] FDA and  has been authorized for detection and/or diagnosis of SARS-CoV-2 by FDA under an Emergency Use Authorization (EUA). This EUA will remain  in effect (meaning this test can be used) for the duration of the COVID-19 declaration under Section 564(b)(1) of the Act, 21 U.S.C. section 360bbb-3(b)(1), unless the authorization is terminated or revoked sooner.    Influenza A by PCR NEGATIVE NEGATIVE Final   Influenza B by PCR NEGATIVE NEGATIVE Final    Comment: (NOTE) The Xpert Xpress SARS-CoV-2/FLU/RSV assay is intended as an aid in  the diagnosis of influenza from Nasopharyngeal swab specimens and  should not be used as a sole basis for treatment. Nasal washings and  aspirates are unacceptable for Xpert Xpress SARS-CoV-2/FLU/RSV  testing. Fact Sheet for Patients: https://www.moore.com/ Fact Sheet for Healthcare Providers: https://www.young.biz/ This test is not yet approved or cleared by the [place] FDA and  has been authorized for detection and/or diagnosis of SARS-CoV-2 by  FDA under an Emergency Use Authorization (EUA).  This EUA will remain  in effect (meaning this test can be used) for the duration of the  Covid-19 declaration under Section 564(b)(1) of the Act, 21  U.S.C. section 360bbb-3(b)(1), unless the authorization is  terminated or revoked. Performed at [facility], [address] [place], [place] [address]   MRSA PCR Screening     Status: None   Collection Time: 11/06/19  2:11 AM   Specimen: Nasal Mucosa; Nasopharyngeal  Result Value Ref Range Status   MRSA by PCR NEGATIVE  NEGATIVE Final    Comment:        The GeneXpert MRSA Assay (FDA approved for NASAL specimens only), is one component of a comprehensive MRSA colonization surveillance program. It is not intended to diagnose MRSA infection nor to guide or monitor treatment for MRSA infections. Performed at [facility], [address] [place], [place] [address]          Radiology Studies: DG Chest 1 View  Result Date: 11/05/2019 CLINICAL DATA:  Dyspnea EXAM: CHEST  1 VIEW COMPARISON:  02/25/2017 FINDINGS: The heart size and mediastinal contours are within normal limits. Both lungs are clear. The visualized skeletal structures are unremarkable. IMPRESSION: No active disease. Electronically Signed   By: Franchot Gallo M.D.   On: 11/05/2019 13:18   CT Renal Stone Study  Result Date: 11/05/2019 CLINICAL DATA:  Vomiting.  Mid back pain.  Flank pain. EXAM: CT ABDOMEN AND PELVIS WITHOUT CONTRAST TECHNIQUE: Multidetector CT imaging of the abdomen and pelvis was performed following the standard protocol without IV contrast. COMPARISON:  None. FINDINGS: Lower chest: No significant pulmonary nodules or acute consolidative airspace disease. Hepatobiliary: Diffuse hepatic steatosis. Normal liver size. No definite liver surface irregularity. No liver masses. Normal gallbladder with no radiopaque cholelithiasis. No biliary ductal dilatation. Pancreas: Normal, with no mass or duct dilation. Spleen: Normal size. No mass.  Adrenals/Urinary Tract: Normal adrenals. Asymmetric mild fullness of the central right renal collecting system and proximal right ureter without overt right hydronephrosis. No left hydronephrosis. Normal caliber left ureter. No renal or ureteral stones. No contour deforming renal masses. Normal bladder. Stomach/Bowel: Normal non-distended stomach. Normal caliber small bowel with no small bowel wall thickening. Normal appendix. Normal large bowel with no diverticulosis, large bowel wall thickening or pericolonic fat stranding. Vascular/Lymphatic: Normal caliber abdominal aorta. No pathologically enlarged lymph nodes in the abdomen or pelvis. Reproductive: Grossly normal anteverted uterus. Intrauterine device appears grossly well positioned within the uterine cavity. No adnexal mass. Other: No pneumoperitoneum, ascites or focal fluid collection. Musculoskeletal: No aggressive appearing focal osseous lesions. IMPRESSION: 1. Asymmetric mild fullness of the central right renal collecting system and proximal right ureter without overt right hydronephrosis. No urolithiasis. 2. Diffuse hepatic steatosis. Electronically Signed   By: Ilona Sorrel M.D.   On: 11/05/2019 17:12   [place] EKG SITE RITE  Result Date: 11/05/2019 If Site Rite image not attached, placement could not be confirmed due to current cardiac rhythm.       Scheduled Meds: . adenosine (ADENOCARD) IV  12 mg Intravenous Once  . Chlorhexidine Gluconate Cloth  6 each Topical Daily  . dextrose  40 mL Intravenous Once  . enoxaparin (LOVENOX) injection  40 mg Subcutaneous Q24H  . insulin aspart  0-9 Units Subcutaneous Q4H  . insulin aspart  4 Units Subcutaneous TID WC  . insulin detemir  25 Units Subcutaneous BID  . pantoprazole  40 mg Oral Daily   Continuous Infusions: . sodium chloride 150 mL/hr at 11/06/19 1011    Assessment & Plan:   Principal Problem:   DKA, type 1 ([place]) Active Problems:   AKI (acute kidney injury) ([place])    Hyperkalemia  Severe DKA, type I: Admit to ICU.  Treat with IV insulin infusion and IV fluids and monitor glucose and BMP per DKA protocol.   AG closing. When off insulin gtt, transition to Levemir 25u sq, novolog 4Units tid. Diabetic educators input appreicated.  Status post SVT: Patient received 2 doses of IV adenosine (total of 18 mg) in  the emergency room. Please secondary to DKA Now in sinus rhythm and sinus tachycardia  Monitor on telemetry.  Check TSH, magnesium and phosphorus levels.    Acute kidney injury complicated by hyperkalemia:  Prerenal from DKA , vomiting.  Improved with IV fluids treat with IV fluids monitor BMP.   Potassium level is expected to improve with hydration and insulin.  Severe leukocytosis: This may be reactive or could be due to infection (UTI).afebrile.   Treat with empiric IV antibiotics , but negative ucx.  Continue to monitor, ck am cbc   Abnormal urinalysis: Treated with empiric IV Zosyn, but I dont see pt getting it. Will hold off as ucs not infective.  and follow-up urine and blood cultures.    Lactic acidosis is likely due to DKA rather than sepsis.  Lactate has improved from 5.1-2.8 with ivf and improvent of glucose. Will ck level again  Hyperglycemia induced hyponatremia: Expected to improve with improvement in glucose levels.  Continue IV fluids and monitor BMP  Acid reflux-we will start her on Protonix  DVT prophylaxis: Lovenox Code Status: Full Family Communication: None at bedside Disposition Plan: Likely DC in 1 to 2 days if blood glucose levels/DKA resolved and labs are stable.  Can transfer out of stepdown unit       LOS: 1 day   Time spent: 45 minutes with more than 50% on COC    Lynn ItoSahar Emelyn Roen, MD [facility] Pager [phone]  If 7PM-7AM, please contact night-coverage [email or web address] Password [email or web address] 11/06/2019, 4:13 PM

## 2019-11-06 NOTE — ED Notes (Signed)
Lab unsuccessful getting blood

## 2019-11-06 NOTE — Progress Notes (Signed)
Ch. visited pt. In ICU per chaplain referral.  Pt. Is 23yof in ICU from reported complications related to her diabetes.  Pt. Expressed general satisfaction with her care; reports "feeling much better" today than yesterday.  Pt. Currently lives with her father, who she reported has recently received a terminal cancer diagnosis.  Pt.'s main concern is being in hospital when her father is ill.  Pt.'s paternal grandfather died of cancer in early February 20, 2019; paternal grandmother also diagnosed with inoperable cancer in Spring 2020. Pt. Shared that her dtr. Noreene Larsson -- aprox 1.26 yo.) and fiance Leta Jungling) are a significant sources of comfort/support.  Pt. Identifies as a Christian, and chaplain prayed for pt.'s concerns at conclusion of visit.  Chaplain offered to follow up with devotional literature.     11/06/19 1400  Clinical Encounter Type  Visited With Patient  Visit Type Initial;Spiritual support  Referral From Chaplain  Recommendations Chaplain will follow up w/Bible and devotional  Spiritual Encounters  Spiritual Needs [facility] text;Literature;Prayer;Emotional  Stress Factors  Patient Stress Factors Family relationships;Health changes;Loss;Loss of control  Family Stress Factors Loss;Health changes

## 2019-11-06 NOTE — Progress Notes (Signed)
Ch visited w/ pt during rounds. Pt is a 23 YOF that has a hx of DM since the age of 89. Pt shared that she has been monitoring her insulin levels and administering the shot to herself and was hopeful to receive the insulin pump but stated that she was denied. Ch provided social support to pt that also shared that she was resting when her tray was brought up and c/o the food not being warmed. Ch communicated with staff for pt to get a new tray. Pt also shared that she was not able to eat much (only the ice cream) as she is still nauseous. The writer had to attend to a code blue and handed off care to [facility].  Follow up recommended.    11/06/19 1300  Clinical Encounter Type  Visited With Patient;Health care provider  Visit Type Spiritual support;Social support  Consult/Referral To Chaplain  Spiritual Encounters  Spiritual Needs Emotional  Stress Factors  Patient Stress Factors Health changes  Family Stress Factors None identified

## 2019-11-06 NOTE — Progress Notes (Signed)
Nutrition Brief Note RD working remotely.  RD received consult for diet education.  Attempted to call patient's room phone and cell phone but patient did not answer. Noted in chart patient has had type 1 DM for approximately 6 year now. Will mail "Carbohydrate Counting for People with Diabetes" handout from the Academy of Nutrition and Dietetics to patient's address listed in chart.  Felix Pacini, MS, RD, LDN Office: [phone] Pager: [phone] After Hours/Weekend Pager: [phone]

## 2019-11-06 NOTE — ED Notes (Signed)
Pt c/o nausea. Prn zofran given

## 2019-11-06 NOTE — ED Notes (Signed)
Lab at bedside to attempt to get bloodwork 

## 2019-11-07 DIAGNOSIS — E876 Hypokalemia: Secondary | ICD-10-CM

## 2019-11-07 LAB — BASIC METABOLIC PANEL
Anion gap: 9 (ref 5–15)
BUN: 17 mg/dL (ref 6–20)
CO2: 17 mmol/L — ABNORMAL LOW (ref 22–32)
Calcium: 8 mg/dL — ABNORMAL LOW (ref 8.9–10.3)
Chloride: 113 mmol/L — ABNORMAL HIGH (ref 98–111)
Creatinine, Ser: 1.02 mg/dL — ABNORMAL HIGH (ref 0.44–1.00)
GFR calc Af Amer: >60 mL/min (ref >60)
GFR calc non Af Amer: >60 mL/min (ref >60)
Glucose, Bld: 66 mg/dL — ABNORMAL LOW (ref 70–99)
Potassium: 3.1 mmol/L — ABNORMAL LOW (ref 3.5–5.1)
Sodium: 139 mmol/L (ref 135–145)

## 2019-11-07 LAB — CBC
HCT: 28.6 % — ABNORMAL LOW (ref 36.0–46.0)
Hemoglobin: 9.6 g/dL — ABNORMAL LOW (ref 12.0–15.0)
MCH: 31 pg (ref 26.0–34.0)
MCHC: 33.6 g/dL (ref 30.0–36.0)
MCV: 92.3 fL (ref 80.0–100.0)
Platelets: 232 10*3/uL (ref 150–400)
RBC: 3.1 MIL/uL — ABNORMAL LOW (ref 3.87–5.11)
RDW: 12.6 % (ref 11.5–15.5)
WBC: 9.3 10*3/uL (ref 4.0–10.5)
nRBC: 0 % (ref 0.0–0.2)

## 2019-11-07 LAB — GLUCOSE, CAPILLARY
Glucose-Capillary: 72 mg/dL (ref 70–99)
Glucose-Capillary: 80 mg/dL (ref 70–99)
Glucose-Capillary: 97 mg/dL (ref 70–99)
Glucose-Capillary: 155 mg/dL — ABNORMAL HIGH (ref 70–99)
Glucose-Capillary: 245 mg/dL — ABNORMAL HIGH (ref 70–99)
Glucose-Capillary: 175 mg/dL — ABNORMAL HIGH (ref 70–99)

## 2019-11-07 LAB — LACTIC ACID, PLASMA: Lactic Acid, Venous: 1 mmol/L (ref 0.5–1.9)

## 2019-11-07 LAB — BETA-HYDROXYBUTYRIC ACID: Beta-Hydroxybutyric Acid: 1.99 mmol/L — ABNORMAL HIGH (ref 0.05–0.27)

## 2019-11-07 MED ORDER — AMLODIPINE BESYLATE 5 MG PO TABS
2.5000 mg | ORAL_TABLET | Freq: Every day | ORAL | Status: DC
  Administered 2019-11-07: 2.5 mg via ORAL
  Filled 2019-11-07: qty 1

## 2019-11-07 MED ORDER — INSULIN DETEMIR 100 UNIT/ML ~~LOC~~ SOLN
20.0000 [IU] | Freq: Two times a day (BID) | SUBCUTANEOUS | Status: DC
  Filled 2019-11-07 (×2): qty 0.2

## 2019-11-07 MED ORDER — PHENOL 1.4 % MT LIQD
1.0000 | OROMUCOSAL | Status: DC | PRN
  Filled 2019-11-07: qty 177

## 2019-11-07 MED ORDER — INSULIN DETEMIR 100 UNIT/ML ~~LOC~~ SOLN
12.0000 [IU] | Freq: Two times a day (BID) | SUBCUTANEOUS | Status: DC
  Administered 2019-11-07 – 2019-11-08 (×2): 12 [IU] via SUBCUTANEOUS
  Filled 2019-11-07 (×4): qty 0.12

## 2019-11-07 MED ORDER — DEXTROSE-NACL 5-0.45 % IV SOLN: INTRAVENOUS | Status: DC

## 2019-11-07 MED ORDER — POTASSIUM CHLORIDE CRYS ER 20 MEQ PO TBCR
40.0000 meq | EXTENDED_RELEASE_TABLET | Freq: Once | ORAL | Status: AC
  Administered 2019-11-07: 16:00:00 40 meq via ORAL
  Filled 2019-11-07: qty 2

## 2019-11-07 MED ORDER — INSULIN ASPART 100 UNIT/ML ~~LOC~~ SOLN
2.0000 [IU] | Freq: Three times a day (TID) | SUBCUTANEOUS | Status: DC
  Administered 2019-11-07 – 2019-11-08 (×3): 2 [IU] via SUBCUTANEOUS
  Filled 2019-11-07 (×3): qty 1

## 2019-11-07 NOTE — Progress Notes (Signed)
PROGRESS NOTE    [name]  XAJ:[id number] DOB: 27-Jul-1996 DOA: 11/05/2019 PCP: Jaclyn Shaggy, MD    Brief Narrative:  Selena Slicker Rabyis an 24 y.o.femalewith medical history significant for type 1 diabetes mellitus, history of DKA, generalized anxiety disorder, who presented to the hospital with vomiting, abdominal pain and back pain. She said she had UTI about 2 weeks ago and she was prescribed Bactrim. However, she developed hives about 2 days into treatment with Bactrim so she discontinued the medicine. She was then prescribed Macrobid which she only tookfor about 2 days. She said she went to a restaurant about 4 days ago where she ate undercooked crab and shrimp and she started vomiting that night. She believes the undercooked food caused her to vomit. The patient found to have severe hyperglycemia (glucose 911),elevated anion gap,severe metabolic acidosis with venous pH of 7.10 and arterial bicarbonate of less than 7. BUN was 43, creatinine was 2.14, sodium 127 and potassium was 6.3. WBC was also elevated at 29.2. She was given 3 L of normal saline in the ED and started on IV insulin infusion. She was also given IV adenosine for SVT, now sinus tach    Consultants:   Diabetic eduacator  Procedures: none  Antimicrobials:   none    Subjective: Patient has decreased p.o. intake.  Blood glucose levels were on the low side.  Not taking much p.o. intake due to nausea still.  No new complaints.  Objective: Vitals:   11/07/19 0900 11/07/19 1000 11/07/19 1100 11/07/19 1240  BP:    (!) 145/108  Pulse:    90  Resp: 14 14 18 20   Temp: 98.1 F (36.7 C)   99.1 F (37.3 C)  TempSrc: Oral   Oral  SpO2:    98%  Weight:      Height:        Intake/Output Summary (Last 24 hours) at 11/07/2019 1400 Last data filed at 11/07/2019 1231 Gross per 24 hour  Intake 1844.37 ml  Output 1300 ml  Net 544.37 ml   Filed Weights   11/05/19 0929  Weight: 83.5 kg     Examination:  General exam: Appears calm and comfortable , appears tired Respiratory system: Clear to auscultation. Respiratory effort normal.  No wheeze rales rhonchi Cardiovascular system: S1 & S2 heard, RRR. No murmurs, rubs, gallops or clicks.  Gastrointestinal system: Abdomen is nondistended, soft and nontender.  Normal bowel sounds heard. Central nervous system: Alert and oriented. No focal neurological deficits. Extremities: No edema Skin: Warm dry Psychiatry: Judgement and insight appear normal. Mood & affect appropriate.     Data Reviewed: I have personally reviewed following labs and imaging studies  CBC: Recent Labs  Lab 11/05/19 1015 11/07/19 0755  WBC 29.2* 9.3  NEUTROABS 25.6*  --   HGB 11.9* 9.6*  HCT 38.0 28.6*  MCV 96.7 92.3  PLT 549* 232   Basic Metabolic Panel: Recent Labs  Lab 11/05/19 1015 11/05/19 1747 11/06/19 0057 11/06/19 0714 11/07/19 0755  NA 127* 135 139 140 139  K 6.3* 5.0 4.2 4.1 3.1*  CL 92* 110 113* 114* 113*  CO2 <7* 8* 16* 14* 17*  GLUCOSE 911* 414* 111* 93 66*  BUN 43* 38* 35* 35* 17  CREATININE 2.13* 1.78* 1.43* 1.19* 1.02*  CALCIUM 9.3 9.4 9.3 9.1 8.0*  MG  --  2.2  --  2.0  --   PHOS  --  2.6  --   --   --  GFR: Estimated Creatinine Clearance: 91.8 mL/min (A) (by C-G formula based on SCr of 1.02 mg/dL (H)). Liver Function Tests: Recent Labs  Lab 11/05/19 1015  AST 20  ALT 21  ALKPHOS 116  BILITOT 1.8*  PROT 8.8*  ALBUMIN 5.1*   No results for input(s): LIPASE, AMYLASE in the last 168 hours. No results for input(s): AMMONIA in the last 168 hours. Coagulation Profile: Recent Labs  Lab 11/05/19 1015  INR 1.2   Cardiac Enzymes: No results for input(s): CKTOTAL, CKMB, CKMBINDEX, TROPONINI in the last 168 hours. BNP (last 3 results) No results for input(s): PROBNP in the last 8760 hours. HbA1C: Recent Labs    11/05/19 1747  HGBA1C 7.1*   CBG: Recent Labs  Lab 11/06/19 2328 11/06/19 2357  11/07/19 0341 11/07/19 0820 11/07/19 1151  GLUCAP 53* 96 72 80 97   Lipid Profile: No results for input(s): CHOL, HDL, LDLCALC, TRIG, CHOLHDL, LDLDIRECT in the last 72 hours. Thyroid Function Tests: Recent Labs    11/05/19 1747 11/06/19 0714  TSH 0.253*  --   FREET4  --  1.11   Anemia Panel: No results for input(s): VITAMINB12, FOLATE, FERRITIN, TIBC, IRON, RETICCTPCT in the last 72 hours. Sepsis Labs: Recent Labs  Lab 11/05/19 1015 11/05/19 1255 11/07/19 0755  LATICACIDVEN 5.1* 2.8* 1.0    Recent Results (from the past 240 hour(s))  Blood Culture (routine x 2)     Status: None (Preliminary result)   Collection Time: 11/05/19 10:15 AM   Specimen: BLOOD  Result Value Ref Range Status   Specimen Description BLOOD RIGHT AC  Final   Special Requests   Final    BOTTLES DRAWN AEROBIC AND ANAEROBIC Blood Culture results may not be optimal due to an excessive volume of blood received in culture bottles   Culture   Final    NO GROWTH 2 DAYS Performed at [facility], [address] [place], [place] [address]    Report Status PENDING  Incomplete  Blood Culture (routine x 2)     Status: None (Preliminary result)   Collection Time: 11/05/19 10:15 AM   Specimen: BLOOD  Result Value Ref Range Status   Specimen Description BLOOD LEFT [facility]  Final   Special Requests   Final    BOTTLES DRAWN AEROBIC AND ANAEROBIC Blood Culture adequate volume   Culture   Final    NO GROWTH 2 DAYS Performed at [facility], [address] [place], [place] [address]    Report Status PENDING  Incomplete  Urine culture     Status: Abnormal   Collection Time: 11/05/19 10:15 AM   Specimen: In/Out Cath Urine  Result Value Ref Range Status   Specimen Description   Final    IN/OUT CATH URINE Performed at [facility], [address] [place], [place] [address]    Special Requests   Final    NONE Performed at [facility], [address] [place], [place]  [address]    Culture (A)  Final    <10,000 COLONIES/mL INSIGNIFICANT GROWTH Performed at [facility], [address] [place], [place] [address]    Report Status 11/06/2019 FINAL  Final  SARS CORONAVIRUS 2 (TAT 6-24 HRS) Nasopharyngeal Nasopharyngeal Swab     Status: None   Collection Time: 11/05/19 12:55 PM   Specimen: Nasopharyngeal Swab  Result Value Ref Range Status   SARS Coronavirus 2 NEGATIVE NEGATIVE Final    Comment: (NOTE) SARS-CoV-2 target nucleic acids are NOT DETECTED.  The SARS-CoV-2 RNA is generally detectable in upper and lower respiratory specimens during the acute phase of infection. Negative results do not preclude SARS-CoV-2 infection, do not rule out co-infections with other pathogens, and should not be used as the sole basis for treatment or other patient management decisions. Negative results must be combined with clinical observations, patient history, and epidemiological information. The expected result is Negative. Fact Sheet for Patients: HairSlick.no Fact Sheet for Healthcare Providers: quierodirigir.com This test is not yet approved or cleared by the [place] FDA and  has been authorized for detection and/or diagnosis of SARS-CoV-2 by FDA under an Emergency Use Authorization (EUA). This EUA will remain  in effect (meaning this test can be used) for the duration of the COVID-19 declaration under Section 56 4(b)(1) of the Act, 21 U.S.C. section 360bbb-3(b)(1), unless the authorization is terminated or revoked sooner. Performed at [facility], [address] [place], [place] [address]   Respiratory Panel by RT PCR (Flu A&B, Covid) - Nasopharyngeal Swab     Status: None   Collection Time: 11/05/19 10:46 PM   Specimen: Nasopharyngeal Swab  Result Value Ref Range Status   SARS Coronavirus 2 by RT PCR NEGATIVE NEGATIVE Final    Comment: (NOTE) SARS-CoV-2 target nucleic acids are NOT  DETECTED. The SARS-CoV-2 RNA is generally detectable in upper respiratoy specimens during the acute phase of infection. The lowest concentration of SARS-CoV-2 viral copies this assay can detect is 131 copies/mL. A negative result does not preclude SARS-Cov-2 infection and should not be used as the sole basis for treatment or other patient management decisions. A negative result may occur with  improper specimen collection/handling, submission of specimen other than nasopharyngeal swab, presence of viral mutation(s) within the areas targeted by this assay, and inadequate number of viral copies (<131 copies/mL). A negative result must be combined with clinical observations, patient history, and epidemiological information. The expected result is Negative. Fact Sheet for Patients:  https://www.moore.com/ Fact Sheet for Healthcare Providers:  https://www.young.biz/ This test is not yet ap proved or cleared by the [place] FDA and  has been authorized for detection and/or diagnosis of SARS-CoV-2 by FDA under an Emergency Use Authorization (EUA). This EUA will remain  in effect (meaning this test can be used) for the duration of the COVID-19 declaration under Section 564(b)(1) of the Act, 21 U.S.C. section 360bbb-3(b)(1), unless the authorization is terminated or revoked sooner.    Influenza A by PCR NEGATIVE NEGATIVE Final   Influenza B by PCR NEGATIVE NEGATIVE Final    Comment: (NOTE) The Xpert Xpress SARS-CoV-2/FLU/RSV assay is intended as an aid in  the diagnosis of influenza from Nasopharyngeal swab specimens and  should not be used as a sole basis for treatment. Nasal washings and  aspirates are unacceptable for Xpert Xpress SARS-CoV-2/FLU/RSV  testing. Fact Sheet for Patients: https://www.moore.com/ Fact Sheet for Healthcare Providers: https://www.young.biz/ This test is not yet approved or cleared  by the [place] FDA and  has been authorized for detection and/or diagnosis of SARS-CoV-2 by  FDA under an Emergency Use Authorization (EUA). This EUA will remain  in effect (meaning this test can be used) for the duration of the  Covid-19 declaration under Section 564(b)(1) of the Act, 21  U.S.C. section 360bbb-3(b)(1), unless the authorization is  terminated or revoked. Performed at [facility], [address], [place], [place] [address]   MRSA PCR Screening     Status: None   Collection Time: 11/06/19  2:11  AM   Specimen: Nasal Mucosa; Nasopharyngeal  Result Value Ref Range Status   MRSA by PCR NEGATIVE NEGATIVE Final    Comment:        The GeneXpert MRSA Assay (FDA approved for NASAL specimens only), is one component of a comprehensive MRSA colonization surveillance program. It is not intended to diagnose MRSA infection nor to guide or monitor treatment for MRSA infections. Performed at [facility], [address], [place], [place] [address]          Radiology Studies: CT Renal Stone Study  Result Date: 11/05/2019 CLINICAL DATA:  Vomiting.  Mid back pain.  Flank pain. EXAM: CT ABDOMEN AND PELVIS WITHOUT CONTRAST TECHNIQUE: Multidetector CT imaging of the abdomen and pelvis was performed following the standard protocol without IV contrast. COMPARISON:  None. FINDINGS: Lower chest: No significant pulmonary nodules or acute consolidative airspace disease. Hepatobiliary: Diffuse hepatic steatosis. Normal liver size. No definite liver surface irregularity. No liver masses. Normal gallbladder with no radiopaque cholelithiasis. No biliary ductal dilatation. Pancreas: Normal, with no mass or duct dilation. Spleen: Normal size. No mass. Adrenals/Urinary Tract: Normal adrenals. Asymmetric mild fullness of the central right renal collecting system and proximal right ureter without overt right hydronephrosis. No left hydronephrosis. Normal caliber left ureter. No  renal or ureteral stones. No contour deforming renal masses. Normal bladder. Stomach/Bowel: Normal non-distended stomach. Normal caliber small bowel with no small bowel wall thickening. Normal appendix. Normal large bowel with no diverticulosis, large bowel wall thickening or pericolonic fat stranding. Vascular/Lymphatic: Normal caliber abdominal aorta. No pathologically enlarged lymph nodes in the abdomen or pelvis. Reproductive: Grossly normal anteverted uterus. Intrauterine device appears grossly well positioned within the uterine cavity. No adnexal mass. Other: No pneumoperitoneum, ascites or focal fluid collection. Musculoskeletal: No aggressive appearing focal osseous lesions. IMPRESSION: 1. Asymmetric mild fullness of the central right renal collecting system and proximal right ureter without overt right hydronephrosis. No urolithiasis. 2. Diffuse hepatic steatosis. Electronically Signed   By: Ilona Sorrel M.D.   On: 11/05/2019 17:12   [place] EKG SITE RITE  Result Date: 11/05/2019 If Site Rite image not attached, placement could not be confirmed due to current cardiac rhythm.       Scheduled Meds: . adenosine (ADENOCARD) IV  12 mg Intravenous Once  . Chlorhexidine Gluconate Cloth  6 each Topical Daily  . dextrose  40 mL Intravenous Once  . enoxaparin (LOVENOX) injection  40 mg Subcutaneous Q24H  . insulin aspart  0-9 Units Subcutaneous Q4H  . insulin aspart  2 Units Subcutaneous TID WC  . insulin detemir  12 Units Subcutaneous BID  . pantoprazole  40 mg Oral Daily   Continuous Infusions: . sodium chloride Stopped (11/07/19 1223)    Assessment & Plan:   Principal Problem:   DKA, type 1 ([place]) Active Problems:   AKI (acute kidney injury) ([place])   Hyperkalemia   Severe DKA, type I: Likley from food poisoning Was Admitted to ICU. Treated with IV insulin infusion and IV fluids and monitor glucose and BMP per DKA protocol.  AG closed off insulin gtt, transition to Levemir , will  decrease to 12Ubid and decrease also NovoLog to 2 units 3 times daily as her blood glucose levels were low and she has decreased p.o. intake.   Diabetic educators following   PSVT: Patient received 2 doses of IV adenosine (total of 18 mg) in the emergency room. Likely secondary to DKA Now in sinus rhythm and sinus tachycardia Monitor on telemetry.  TSH low but free T4 is normal.  Need to recheck this in 4 weeks once over current medical issues.   Hypokalemia- will replace Monitor levels  Acute kidney injury complicated by hyperkalemia:  Prerenal from DKA , vomiting.  Improved with IV fluids treat with IV fluids monitor BMP.  Potassium level is expected to improve with hydration and insulin.  Severe leukocytosis-likely reactive/stress-induced.  No infection evident.  She did have food poisoning.  (UTI).afebrile.   Off abx. Continue to monitor, ck am cbc   Abnormal urinalysis: Treated with empiric IV Zosyn, but I dont see pt getting it. Will hold off as ucs not infective.  and follow-up urine and blood cultures so far neg.  Lactic acidosis is likely due to DKA rather than sepsis. Lactate has improved from 5.1-2.8 with ivf and improvent of glucose. Now resolved.  Hyperglycemia induced hyponatremia: Expected to improve with improvement in glucose levels.  Resolved Continue IV fluids and monitor BMP  Acid reflux-continue on Protonix  DVT prophylaxis: Lovenox Code Status: Full Family Communication: None at bedside Disposition Plan: Likely DC in 1 to 2 days when pt able to take more po as her BG levels now on low side and still nauseous.      LOS: 2 days   Time spent: 45 minutes with more than 50% COC    Lynn Ito, MD [facility] Pager [phone]  If 7PM-7AM, please contact night-coverage [email or web address] Password [email or web address] 11/07/2019, 2:00 PM

## 2019-11-07 NOTE — Progress Notes (Addendum)
Inpatient Diabetes Program Recommendations  AACE/ADA: New Consensus Statement on Inpatient Glycemic Control (2015)  Target Ranges:  Prepandial:   less than 140 mg/dL      Peak postprandial:   less than 180 mg/dL (1-2 hours)      Critically ill patients:  140 - 180 mg/dL   Lab Results  Component Value Date   GLUCAP 97 11/07/2019   HGBA1C 7.1 (H) 11/05/2019    Review of Glycemic Control Results for [name], [name] (MRN [id number]) as of 11/07/2019 12:33  Ref. Range 11/06/2019 19:45 11/06/2019 23:26 11/06/2019 23:28 11/06/2019 23:57 11/07/2019 03:41 11/07/2019 08:20 11/07/2019 11:51  Glucose-Capillary Latest Ref Range: 70 - 99 mg/dL 927 (H) 54 (L) 53 (L) 96 72 80 97   Diabetes history: DM 1 Diabetes history: T1D Outpatient Diabetes medications: Levemir 28 units daily; Novolog 1 unit/3 carbohydrates; Novolog SSI 1 unit for every 50 above 150 Current orders for Inpatient glycemic control: Novolog 0-10 TID with meals; Levemir 20 units bid; Novolog 4 units with meals Inpatient Diabetes Program Recommendations:   Patient received a total of 40 units of Levemir on 1/7.  Note blood sugars low overnight.  Per RN patient is eating very little.  Levemir was held this AM.  -Please reduce Levemir to 12 units bid. -Consider adding Dextrose to  IV fluids since patient not eating  Thanks,  Beryl Meager, RN, BC-ADM Inpatient Diabetes Coordinator Pager [phone] (8a-5p)

## 2019-11-08 DIAGNOSIS — N390 Urinary tract infection, site not specified: Secondary | ICD-10-CM

## 2019-11-08 DIAGNOSIS — E871 Hypo-osmolality and hyponatremia: Secondary | ICD-10-CM

## 2019-11-08 LAB — BASIC METABOLIC PANEL
Anion gap: 8 (ref 5–15)
BUN: 9 mg/dL (ref 6–20)
CO2: 21 mmol/L — ABNORMAL LOW (ref 22–32)
Calcium: 8.1 mg/dL — ABNORMAL LOW (ref 8.9–10.3)
Chloride: 111 mmol/L (ref 98–111)
Creatinine, Ser: 0.88 mg/dL (ref 0.44–1.00)
GFR calc Af Amer: >60 mL/min (ref >60)
GFR calc non Af Amer: >60 mL/min (ref >60)
Glucose, Bld: 158 mg/dL — ABNORMAL HIGH (ref 70–99)
Potassium: 3 mmol/L — ABNORMAL LOW (ref 3.5–5.1)
Sodium: 140 mmol/L (ref 135–145)

## 2019-11-08 LAB — URINALYSIS, COMPLETE (UACMP) WITH MICROSCOPIC
Bilirubin Urine: NEGATIVE
Glucose, UA: >=500 mg/dL — AB
Ketones, ur: 20 mg/dL — AB
Nitrite: NEGATIVE
Protein, ur: NEGATIVE mg/dL
Specific Gravity, Urine: 1.007 (ref 1.005–1.030)
Squamous Epithelial / HPF: NONE SEEN (ref 0–5)
WBC, UA: >50 WBC/hpf — ABNORMAL HIGH (ref 0–5)
pH: 6 (ref 5.0–8.0)

## 2019-11-08 LAB — GLUCOSE, CAPILLARY
Glucose-Capillary: 158 mg/dL — ABNORMAL HIGH (ref 70–99)
Glucose-Capillary: 156 mg/dL — ABNORMAL HIGH (ref 70–99)
Glucose-Capillary: 204 mg/dL — ABNORMAL HIGH (ref 70–99)

## 2019-11-08 LAB — POTASSIUM: Potassium: 3.5 mmol/L (ref 3.5–5.1)

## 2019-11-08 MED ORDER — AMLODIPINE BESYLATE 5 MG PO TABS: 5.0000 mg | ORAL_TABLET | Freq: Every day | ORAL | 0 refills | Status: DC

## 2019-11-08 MED ORDER — AMLODIPINE BESYLATE 5 MG PO TABS
5.0000 mg | ORAL_TABLET | Freq: Every day | ORAL | Status: DC
  Administered 2019-11-08: 5 mg via ORAL
  Filled 2019-11-08: qty 1

## 2019-11-08 MED ORDER — LEVOFLOXACIN 250 MG PO TABS
250.0000 mg | ORAL_TABLET | Freq: Every day | ORAL | Status: DC
  Administered 2019-11-08: 250 mg via ORAL
  Filled 2019-11-08: qty 1

## 2019-11-08 MED ORDER — PHENAZOPYRIDINE HCL 100 MG PO TABS: 100.0000 mg | ORAL_TABLET | Freq: Three times a day (TID) | ORAL | 0 refills | Status: DC

## 2019-11-08 MED ORDER — POTASSIUM CHLORIDE CRYS ER 20 MEQ PO TBCR
40.0000 meq | EXTENDED_RELEASE_TABLET | Freq: Once | ORAL | Status: AC
  Administered 2019-11-08: 40 meq via ORAL
  Filled 2019-11-08: qty 2

## 2019-11-08 MED ORDER — LEVOFLOXACIN 250 MG PO TABS: 250.0000 mg | ORAL_TABLET | Freq: Every day | ORAL | 0 refills | Status: DC

## 2019-11-08 MED ORDER — PHENAZOPYRIDINE HCL 100 MG PO TABS
100.0000 mg | ORAL_TABLET | Freq: Three times a day (TID) | ORAL | Status: DC
  Administered 2019-11-08: 100 mg via ORAL
  Filled 2019-11-08 (×2): qty 1

## 2019-11-08 MED ORDER — INSULIN ASPART 100 UNIT/ML ~~LOC~~ SOLN: 2.0000 [IU] | Freq: Three times a day (TID) | SUBCUTANEOUS | 11 refills | Status: DC

## 2019-11-08 MED ORDER — LIDOCAINE VISCOUS HCL 2 % MT SOLN
15.0000 mL | Freq: Four times a day (QID) | OROMUCOSAL | Status: DC | PRN
  Administered 2019-11-08: 15 mL via OROMUCOSAL
  Filled 2019-11-08 (×2): qty 15

## 2019-11-08 MED ORDER — PANTOPRAZOLE SODIUM 40 MG PO TBEC: 40.0000 mg | DELAYED_RELEASE_TABLET | Freq: Every day | ORAL | 0 refills | Status: AC

## 2019-11-08 MED ORDER — FAMOTIDINE 20 MG PO TABS
20.0000 mg | ORAL_TABLET | Freq: Once | ORAL | Status: AC
  Administered 2019-11-08: 20 mg via ORAL
  Filled 2019-11-08: qty 1

## 2019-11-08 MED ORDER — POTASSIUM CHLORIDE CRYS ER 20 MEQ PO TBCR
40.0000 meq | EXTENDED_RELEASE_TABLET | Freq: Two times a day (BID) | ORAL | Status: DC
  Administered 2019-11-08: 40 meq via ORAL
  Filled 2019-11-08: qty 2

## 2019-11-08 MED ORDER — INSULIN DETEMIR 100 UNIT/ML ~~LOC~~ SOLN: 12.0000 [IU] | Freq: Two times a day (BID) | SUBCUTANEOUS | 11 refills | Status: AC

## 2019-11-08 NOTE — Discharge Summary (Addendum)
[name] ZOX:[id number] DOB: [birth year] DOA: 11/05/2019  PCP: Jaclyn Shaggyate, Denny C, MD  Admit date: 11/05/2019 Discharge date: 11/08/2019  Admitted From: home Disposition:  home  Recommendations for Outpatient Follow-up:  1. Follow up with PCP in 1 week 2. Please obtain BMP/CBC in one week 3. Please follow up on the following pending results:urine culture  Home Health:none    Discharge Condition:Stable CODE STATUS:full  Diet recommendation:  Carb Modified  Brief/Interim Summary: [name] is an 24 y.o. female with medical history significant for type 1 diabetes mellitus, history of DKA, generalized anxiety disorder, who presented to the hospital with vomiting, abdominal pain and back pain.  She said she had UTI about 2 weeks ago and she was prescribed Bactrim.  However, she developed hives about 2 days into treatment with Bactrim so she discontinued the medicine.  She was then prescribed Macrobid which she only tookfor about 2 days.  She said she went to a restaurant about 4 days ago where she ate undercooked crab and shrimp and she started vomiting that night.  She believes the undercooked food caused her to vomit. she takes maximum of 30 units of Levemir at night depending on her blood sugar and she also takes NovoLog with meals depending on how much carbs she eats during the day.The patient was found to have severe hyperglycemia (glucose 911), elevated anion gap, severe metabolic acidosis with venous pH of 7.10 and arterial bicarbonate of less than 7.  BUN was 43, creatinine was 2.14, sodium 127 and potassium was 6.3.  WBC was also elevated at 29.2 Patient was also found with run of SVT which resolved with IV ID nursing and this was thought due to severe DKA.  Patient was admitted to the ICU and started on insulin drip and DKA protocol.  Was given IV fluids for hydration.  She had no further episodes of SVT.  She was transition from insulin drip to Levemir with decreased dose as she had p.o. intake  being decreased.  She has some nausea but no further vomiting.  She did tolerate her food finally today.  Her acute kidney injury was due to prerenal dehydration from DKA which improved with IV hydration.  Her leukocytosis was likely reactive as her urine culture blood cultures so far were negative today although patient today complained of dysuria and was started on Levaquin p.o.  Urinalysis was recent today which was positive.  She was also found with lactic acidosis which was likely due to DKA rather than sepsis.  Her lactic acid did improve with her improvement of glucose.  Mentation she had hyponatremia due to profound hyperglycemia which improved with IV fluids.  She was also started on Protonix for complaints of acid reflux.  She was eventually transferred out of ICU to medical floor and she has been doing well and hemodynamically stable.  Her blood pressure has been elevated and she was started on low-dose amlodipine.  Her potassium today was supplemented with repeat potassium at 3.5.  She is eating more today.  She is stable to be discharged home. ADDENDUM; I discussed with the patient about change in her insulin as she is eating less, once she goes back to her diet amount , she can resume her regular dose insulin. Also discussed decrease novolog dose if still has less po intake. she verbalizes an understanding.  Discharge Diagnoses:  Principal Problem:   DKA, type 1 (HCC) Active Problems:   AKI (acute kidney injury) (HCC)   Hyperkalemia  Discharge Instructions  Discharge Instructions    Call MD for:  difficulty breathing, headache or visual disturbances   Complete by: As directed    Call MD for:  temperature >100.4   Complete by: As directed    Diet - low sodium heart healthy   Complete by: As directed    Discharge instructions   Complete by: As directed    F/u with pcp and endocrinologist. Discuss blood pressure. Since not eating take lower dose of novolog and lantus.    Increase activity slowly   Complete by: As directed      Allergies as of 11/08/2019      Reactions   Sulfamethoxazole-trimethoprim Hives   Pravastatin Other (See Comments)      Medication List    STOP taking these medications   nitrofurantoin (macrocrystal-monohydrate) 100 MG capsule Commonly known as: MACROBID   NovoLOG FlexPen 100 UNIT/ML FlexPen Generic drug: insulin aspart Replaced by: insulin aspart 100 UNIT/ML injection     TAKE these medications   amLODipine 5 MG tablet Commonly known as: NORVASC Take 1 tablet (5 mg total) by mouth daily. Start taking on: November 09, 2019   insulin aspart 100 UNIT/ML injection Commonly known as: novoLOG Inject 2 Units into the skin 3 (three) times daily with meals. Replaces: NovoLOG FlexPen 100 UNIT/ML FlexPen   insulin detemir 100 UNIT/ML injection Commonly known as: LEVEMIR Inject 0.12 mLs (12 Units total) into the skin 2 (two) times daily. What changed:   how much to take  when to take this   levofloxacin 250 MG tablet Commonly known as: LEVAQUIN Take 1 tablet (250 mg total) by mouth daily. Start taking on: November 09, 2019   pantoprazole 40 MG tablet Commonly known as: PROTONIX Take 1 tablet (40 mg total) by mouth daily. Start taking on: November 09, 2019   phenazopyridine 100 MG tablet Commonly known as: PYRIDIUM Take 1 tablet (100 mg total) by mouth 3 (three) times daily with meals. What changed:   medication strength  how much to take  when to take this      Follow-up Information    Jaclyn Shaggy, MD Follow up.   Specialty: Internal Medicine Contact information: [address]   [place] [address] [phone]          Allergies  Allergen Reactions  . Sulfamethoxazole-Trimethoprim Hives  . Pravastatin Other (See Comments)    Consultations:  Diabetic education   Procedures/Studies: DG Chest 1 View  Result Date: 11/05/2019 CLINICAL DATA:  Dyspnea EXAM: CHEST  1 VIEW COMPARISON:   02/25/2017 FINDINGS: The heart size and mediastinal contours are within normal limits. Both lungs are clear. The visualized skeletal structures are unremarkable. IMPRESSION: No active disease. Electronically Signed   By: Marlan Palau M.D.   On: 11/05/2019 13:18   CT Renal Stone Study  Result Date: 11/05/2019 CLINICAL DATA:  Vomiting.  Mid back pain.  Flank pain. EXAM: CT ABDOMEN AND PELVIS WITHOUT CONTRAST TECHNIQUE: Multidetector CT imaging of the abdomen and pelvis was performed following the standard protocol without IV contrast. COMPARISON:  None. FINDINGS: Lower chest: No significant pulmonary nodules or acute consolidative airspace disease. Hepatobiliary: Diffuse hepatic steatosis. Normal liver size. No definite liver surface irregularity. No liver masses. Normal gallbladder with no radiopaque cholelithiasis. No biliary ductal dilatation. Pancreas: Normal, with no mass or duct dilation. Spleen: Normal size. No mass. Adrenals/Urinary Tract: Normal adrenals. Asymmetric mild fullness of the central right renal collecting system and proximal right ureter without overt  right hydronephrosis. No left hydronephrosis. Normal caliber left ureter. No renal or ureteral stones. No contour deforming renal masses. Normal bladder. Stomach/Bowel: Normal non-distended stomach. Normal caliber small bowel with no small bowel wall thickening. Normal appendix. Normal large bowel with no diverticulosis, large bowel wall thickening or pericolonic fat stranding. Vascular/Lymphatic: Normal caliber abdominal aorta. No pathologically enlarged lymph nodes in the abdomen or pelvis. Reproductive: Grossly normal anteverted uterus. Intrauterine device appears grossly well positioned within the uterine cavity. No adnexal mass. Other: No pneumoperitoneum, ascites or focal fluid collection. Musculoskeletal: No aggressive appearing focal osseous lesions. IMPRESSION: 1. Asymmetric mild fullness of the central right renal collecting system  and proximal right ureter without overt right hydronephrosis. No urolithiasis. 2. Diffuse hepatic steatosis. Electronically Signed   By: Delbert Phenix M.D.   On: 11/05/2019 17:12   [place] EKG SITE RITE  Result Date: 11/05/2019 If Site Rite image not attached, placement could not be confirmed due to current cardiac rhythm.      Subjective: Patient has no further vomiting or nausea.  Denies any chest pain shortness of breath or dizziness.  Discharge Exam: Vitals:   11/08/19 0419 11/08/19 1208  BP: (!) 145/101 (!) 147/105  Pulse: 79 78  Resp:  18  Temp:  98.9 F (37.2 C)  SpO2:  98%   Vitals:   11/07/19 2147 11/08/19 0403 11/08/19 0419 11/08/19 1208  BP: (!) 142/102 (!) 146/102 (!) 145/101 (!) 147/105  Pulse: 80 83 79 78  Resp: 20 20  18   Temp: 99.4 F (37.4 C) 98.8 F (37.1 C)  98.9 F (37.2 C)  TempSrc: Oral Oral  Oral  SpO2: 100% 98%  98%  Weight:      Height:        General: Pt is alert, awake, not in acute distress Cardiovascular: RRR, S1/S2 +, no rubs, no gallops Respiratory: CTA bilaterally, no wheezing, no rhonchi Abdominal: Soft, NT, ND, bowel sounds + Extremities: no edema, no cyanosis    The results of significant diagnostics from this hospitalization (including imaging, microbiology, ancillary and laboratory) are listed below for reference.     Microbiology: Recent Results (from the past 240 hour(s))  Blood Culture (routine x 2)     Status: None (Preliminary result)   Collection Time: 11/05/19 10:15 AM   Specimen: BLOOD  Result Value Ref Range Status   Specimen Description BLOOD RIGHT AC  Final   Special Requests   Final    BOTTLES DRAWN AEROBIC AND ANAEROBIC Blood Culture results may not be optimal due to an excessive volume of blood received in culture bottles   Culture   Final    NO GROWTH 3 DAYS Performed at [facility], [address] [place], [place]    Report Status PENDING  Incomplete  Blood Culture (routine x 2)      Status: None (Preliminary result)   Collection Time: 11/05/19 10:15 AM   Specimen: BLOOD  Result Value Ref Range Status   Specimen Description BLOOD LEFT [facility]  Final   Special Requests   Final    BOTTLES DRAWN AEROBIC AND ANAEROBIC Blood Culture adequate volume   Culture   Final    NO GROWTH 3 DAYS Performed at [facility], [address], [place], [place]    Report Status PENDING  Incomplete  Urine culture     Status: Abnormal   Collection Time: 11/05/19 10:15 AM   Specimen: In/Out Cath Urine  Result Value Ref Range Status  Specimen Description   Final    IN/OUT CATH URINE Performed at [facility], [address], [place], [place] [address]    Special Requests   Final    NONE Performed at [facility], [address] [place], [place] [address]    Culture (A)  Final    <10,000 COLONIES/mL INSIGNIFICANT GROWTH Performed at [facility] [address] [place], [place] [address]    Report Status 11/06/2019 FINAL  Final  SARS CORONAVIRUS 2 (TAT 6-24 HRS) Nasopharyngeal Nasopharyngeal Swab     Status: None   Collection Time: 11/05/19 12:55 PM   Specimen: Nasopharyngeal Swab  Result Value Ref Range Status   SARS Coronavirus 2 NEGATIVE NEGATIVE Final    Comment: (NOTE) SARS-CoV-2 target nucleic acids are NOT DETECTED. The SARS-CoV-2 RNA is generally detectable in upper and lower respiratory specimens during the acute phase of infection. Negative results do not preclude SARS-CoV-2 infection, do not rule out co-infections with other pathogens, and should not be used as the sole basis for treatment or other patient management decisions. Negative results must be combined with clinical observations, patient history, and epidemiological information. The expected result is Negative. Fact Sheet for Patients: SugarRoll.be Fact Sheet for Healthcare Providers: https://www.woods-mathews.com/ This  test is not yet approved or cleared by the [place] FDA and  has been authorized for detection and/or diagnosis of SARS-CoV-2 by FDA under an Emergency Use Authorization (EUA). This EUA will remain  in effect (meaning this test can be used) for the duration of the COVID-19 declaration under Section 56 4(b)(1) of the Act, 21 U.S.C. section 360bbb-3(b)(1), unless the authorization is terminated or revoked sooner. Performed at [facility], [place] [address] [place],  [address]   Respiratory Panel by RT PCR (Flu A&B, Covid) - Nasopharyngeal Swab     Status: None   Collection Time: 11/05/19 10:46 PM   Specimen: Nasopharyngeal Swab  Result Value Ref Range Status   SARS Coronavirus 2 by RT PCR NEGATIVE NEGATIVE Final    Comment: (NOTE) SARS-CoV-2 target nucleic acids are NOT DETECTED. The SARS-CoV-2 RNA is generally detectable in upper respiratoy specimens during the acute phase of infection. The lowest concentration of SARS-CoV-2 viral copies this assay can detect is 131 copies/mL. A negative result does not preclude SARS-Cov-2 infection and should not be used as the sole basis for treatment or other patient management decisions. A negative result may occur with  improper specimen collection/handling, submission of specimen other than nasopharyngeal swab, presence of viral mutation(s) within the areas targeted by this assay, and inadequate number of viral copies (<131 copies/mL). A negative result must be combined with clinical observations, patient history, and epidemiological information. The expected result is Negative. Fact Sheet for Patients:  PinkCheek.be Fact Sheet for Healthcare Providers:  GravelBags.it This test is not yet ap proved or cleared by the [place] FDA and  has been authorized for detection and/or diagnosis of SARS-CoV-2 by FDA under an Emergency Use Authorization (EUA). This EUA will  remain  in effect (meaning this test can be used) for the duration of the COVID-19 declaration under Section 564(b)(1) of the Act, 21 U.S.C. section 360bbb-3(b)(1), unless the authorization is terminated or revoked sooner.    Influenza A by PCR NEGATIVE NEGATIVE Final   Influenza B by PCR NEGATIVE NEGATIVE Final    Comment: (NOTE) The Xpert Xpress SARS-CoV-2/FLU/RSV assay is intended as an aid in  the diagnosis of influenza from Nasopharyngeal swab specimens and  should not be used as a sole basis for treatment. Nasal washings and  aspirates are unacceptable for Xpert Xpress SARS-CoV-2/FLU/RSV  testing. Fact Sheet for Patients: https://www.moore.com/ Fact Sheet for Healthcare Providers: https://www.young.biz/ This test is not yet approved or cleared by the [place] FDA and  has been authorized for detection and/or diagnosis of SARS-CoV-2 by  FDA under an Emergency Use Authorization (EUA). This EUA will remain  in effect (meaning this test can be used) for the duration of the  Covid-19 declaration under Section 564(b)(1) of the Act, 21  U.S.C. section 360bbb-3(b)(1), unless the authorization is  terminated or revoked. Performed at [facility], [address] [place], [place] [address]   MRSA PCR Screening     Status: None   Collection Time: 11/06/19  2:11 AM   Specimen: Nasal Mucosa; Nasopharyngeal  Result Value Ref Range Status   MRSA by PCR NEGATIVE NEGATIVE Final    Comment:        The GeneXpert MRSA Assay (FDA approved for NASAL specimens only), is one component of a comprehensive MRSA colonization surveillance program. It is not intended to diagnose MRSA infection nor to guide or monitor treatment for MRSA infections. Performed at [facility], [address] [place], [place] [address]      Labs: BNP (last 3 results) No results for input(s): BNP in the last 8760 hours. Basic Metabolic  Panel: Recent Labs  Lab 11/05/19 1747 11/06/19 0057 11/06/19 0714 11/07/19 0755 11/08/19 0551 11/08/19 1303  NA 135 139 140 139 140  --   K 5.0 4.2 4.1 3.1* 3.0* 3.5  CL 110 113* 114* 113* 111  --   CO2 8* 16* 14* 17* 21*  --   GLUCOSE 414* 111* 93 66* 158*  --   BUN 38* 35* 35* 17 9  --   CREATININE 1.78* 1.43* 1.19* 1.02* 0.88  --   CALCIUM 9.4 9.3 9.1 8.0* 8.1*  --   MG 2.2  --  2.0  --   --   --   PHOS 2.6  --   --   --   --   --    Liver Function Tests: Recent Labs  Lab 11/05/19 1015  AST 20  ALT 21  ALKPHOS 116  BILITOT 1.8*  PROT 8.8*  ALBUMIN 5.1*   No results for input(s): LIPASE, AMYLASE in the last 168 hours. No results for input(s): AMMONIA in the last 168 hours. CBC: Recent Labs  Lab 11/05/19 1015 11/07/19 0755  WBC 29.2* 9.3  NEUTROABS 25.6*  --   HGB 11.9* 9.6*  HCT 38.0 28.6*  MCV 96.7 92.3  PLT 549* 232   Cardiac Enzymes: No results for input(s): CKTOTAL, CKMB, CKMBINDEX, TROPONINI in the last 168 hours. BNP: Invalid input(s): POCBNP CBG: Recent Labs  Lab 11/07/19 1934 11/07/19 2317 11/08/19 0359 11/08/19 0738 11/08/19 1209  GLUCAP 245* 175* 158* 156* 204*   D-Dimer No results for input(s): DDIMER in the last 72 hours. Hgb A1c Recent Labs    11/05/19 1747  HGBA1C 7.1*   Lipid Profile No results for input(s): CHOL, HDL, LDLCALC, TRIG, CHOLHDL, LDLDIRECT in the last 72 hours. Thyroid function studies Recent Labs    11/05/19 1747  TSH 0.253*   Anemia work up No results for input(s): VITAMINB12, FOLATE, FERRITIN, TIBC, IRON, RETICCTPCT in the last 72 hours. Urinalysis    Component Value Date/Time   COLORURINE YELLOW (A) 11/08/2019 1154   APPEARANCEUR CLOUDY (A) 11/08/2019 1154  APPEARANCEUR Cloudy (A) 07/05/2018 1626   LABSPEC 1.007 11/08/2019 1154   LABSPEC 1.019 03/18/2014 1714   PHURINE 6.0 11/08/2019 1154   GLUCOSEU >=500 (A) 11/08/2019 1154   GLUCOSEU 50 mg/dL 16/10/960405/20/2015 [id number]   HGBUR SMALL (A) 11/08/2019 1154    BILIRUBINUR NEGATIVE 11/08/2019 1154   BILIRUBINUR Negative 07/05/2018 1626   BILIRUBINUR Negative 03/18/2014 1714   KETONESUR 20 (A) 11/08/2019 1154   PROTEINUR NEGATIVE 11/08/2019 1154   UROBILINOGEN 0.2 06/24/2018 1356   NITRITE NEGATIVE 11/08/2019 1154   LEUKOCYTESUR LARGE (A) 11/08/2019 1154   LEUKOCYTESUR 2+ 03/18/2014 1714   Sepsis Labs Invalid input(s): PROCALCITONIN,  WBC,  LACTICIDVEN Microbiology Recent Results (from the past 240 hour(s))  Blood Culture (routine x 2)     Status: None (Preliminary result)   Collection Time: 11/05/19 10:15 AM   Specimen: BLOOD  Result Value Ref Range Status   Specimen Description BLOOD RIGHT AC  Final   Special Requests   Final    BOTTLES DRAWN AEROBIC AND ANAEROBIC Blood Culture results may not be optimal due to an excessive volume of blood received in culture bottles   Culture   Final    NO GROWTH 3 DAYS Performed at [facility], [address] [place], [place] [address]    Report Status PENDING  Incomplete  Blood Culture (routine x 2)     Status: None (Preliminary result)   Collection Time: 11/05/19 10:15 AM   Specimen: BLOOD  Result Value Ref Range Status   Specimen Description [facility]  Final   Special Requests   Final    BOTTLES DRAWN AEROBIC AND ANAEROBIC Blood Culture adequate volume   Culture   Final    NO GROWTH 3 DAYS Performed at [facility], [address] [place], [place] [address]    Report Status PENDING  Incomplete  Urine culture     Status: Abnormal   Collection Time: 11/05/19 10:15 AM   Specimen: In/Out Cath Urine  Result Value Ref Range Status   Specimen Description   Final    IN/OUT CATH URINE Performed at [facility], [address] [place], [place] [address]    Special Requests   Final    NONE Performed at [facility], [address] [place], [place] [address]    Culture (A)  Final    <10,000 COLONIES/mL INSIGNIFICANT GROWTH Performed at [facility], [address] [place], [place] [address]    Report Status 11/06/2019 FINAL  Final  SARS CORONAVIRUS 2 (TAT 6-24 HRS) Nasopharyngeal Nasopharyngeal Swab     Status: None   Collection Time: 11/05/19 12:55 PM   Specimen: Nasopharyngeal Swab  Result Value Ref Range Status   SARS Coronavirus 2 NEGATIVE NEGATIVE Final    Comment: (NOTE) SARS-CoV-2 target nucleic acids are NOT DETECTED. The SARS-CoV-2 RNA is generally detectable in upper and lower respiratory specimens during the acute phase of infection. Negative results do not preclude SARS-CoV-2 infection, do not rule out co-infections with other pathogens, and should not be used as the sole basis for treatment or other patient management decisions. Negative results must be combined with clinical observations, patient history, and epidemiological information. The expected result is Negative. Fact Sheet for Patients: HairSlick.nohttps://www.fda.gov/media/138098/download Fact Sheet for Healthcare Providers: quierodirigir.comhttps://www.fda.gov/media/138095/download This test is not yet approved or cleared by the [place] FDA and  has been authorized for detection and/or diagnosis of SARS-CoV-2 by FDA under an Emergency Use Authorization (EUA). This EUA will remain  in effect (meaning this test can be used) for the duration of the COVID-19 declaration under Section 56 4(b)(1) of the Act, 21 U.S.C. section 360bbb-3(b)(1), unless the authorization is terminated or revoked sooner. Performed at [facility], [address], [place], [place] [address]   Respiratory Panel by RT PCR (Flu A&B, Covid) - Nasopharyngeal Swab     Status: None   Collection Time: 11/05/19 10:46 PM   Specimen: Nasopharyngeal Swab  Result Value Ref Range Status   SARS Coronavirus 2 by RT PCR NEGATIVE NEGATIVE Final    Comment: (NOTE) SARS-CoV-2 target nucleic acids are NOT DETECTED. The SARS-CoV-2 RNA is generally detectable in upper respiratoy specimens during the  acute phase of infection. The lowest concentration of SARS-CoV-2 viral copies this assay can detect is 131 copies/mL. A negative result does not preclude SARS-Cov-2 infection and should not be used as the sole basis for treatment or other patient management decisions. A negative result may occur with  improper specimen collection/handling, submission of specimen other than nasopharyngeal swab, presence of viral mutation(s) within the areas targeted by this assay, and inadequate number of viral copies (<131 copies/mL). A negative result must be combined with clinical observations, patient history, and epidemiological information. The expected result is Negative. Fact Sheet for Patients:  https://www.moore.com/ Fact Sheet for Healthcare Providers:  https://www.young.biz/ This test is not yet ap proved or cleared by the [place] FDA and  has been authorized for detection and/or diagnosis of SARS-CoV-2 by FDA under an Emergency Use Authorization (EUA). This EUA will remain  in effect (meaning this test can be used) for the duration of the COVID-19 declaration under Section 564(b)(1) of the Act, 21 U.S.C. section 360bbb-3(b)(1), unless the authorization is terminated or revoked sooner.    Influenza A by PCR NEGATIVE NEGATIVE Final   Influenza B by PCR NEGATIVE NEGATIVE Final    Comment: (NOTE) The Xpert Xpress SARS-CoV-2/FLU/RSV assay is intended as an aid in  the diagnosis of influenza from Nasopharyngeal swab specimens and  should not be used as a sole basis for treatment. Nasal washings and  aspirates are unacceptable for Xpert Xpress SARS-CoV-2/FLU/RSV  testing. Fact Sheet for Patients: https://www.moore.com/ Fact Sheet for Healthcare Providers: https://www.young.biz/ This test is not yet approved or cleared by the [place] FDA and  has been authorized for detection and/or diagnosis of SARS-CoV-2  by  FDA under an Emergency Use Authorization (EUA). This EUA will remain  in effect (meaning this test can be used) for the duration of the  Covid-19 declaration under Section 564(b)(1) of the Act, 21  U.S.C. section 360bbb-3(b)(1), unless the authorization is  terminated or revoked. Performed at [facility], [address] [place], [place] [address]   MRSA PCR Screening     Status: None   Collection Time: 11/06/19  2:11 AM   Specimen: Nasal Mucosa; Nasopharyngeal  Result Value Ref Range Status   MRSA by PCR NEGATIVE NEGATIVE Final    Comment:        The GeneXpert MRSA Assay (FDA approved for NASAL specimens only), is one component of a comprehensive MRSA colonization surveillance program. It is not intended to diagnose MRSA infection nor to guide or monitor treatment for MRSA infections. Performed at [facility], [address], [place], [place] [address]    Time coordinating discharge: Over 30 minutes  SIGNED:   Lynn Ito, MD  [facility] 11/08/2019, 3:08 PM Pager   If 7PM-7AM, please contact night-coverage [email or web address] Password [email or web address]

## 2019-11-08 NOTE — Progress Notes (Signed)
Pt discharged per MD order. IV removed. Discharge instructions reviewed with pt. Pt verbalized understanding and all questions answered to pt satisfaction. Pt declined wheelchair and walked to car with her fiance.

## 2019-11-10 LAB — URINE CULTURE: Culture: >=100000 — AB

## 2019-11-10 LAB — CULTURE, BLOOD (ROUTINE X 2)
Culture: NO GROWTH
Culture: NO GROWTH
Special Requests: ADEQUATE

## 2020-01-05 NOTE — Progress Notes (Signed)
01/06/20 12:42 PM   [name] Jun 04, 1996 [id number]  Referring provider: Albina Billet, MD [address]   [place],  [place] [address]  Chief Complaint  Patient presents with  . Recurrent UTI    HPI: [name] is a 24 y.o. white F with a hx of type 1 diabetes presents today for the evaluation and management of rUTIs. She is referred to [place] by A. Governor Rooks, MD.   She presented to the ED for DKA following UTI. She was initially on Bactrim but developed hives 2 days into treatment so discontinued the antibiotic and was prescribed Macrobid.  Her hospital course was complicated by DKA and SVT requiring adenosine.  Her UA from 11/08/19 indicated >50 WBC, few bacteria, >= 500 glucose with yellow cloudy appearance.  CT which showed asymmetric mild fullness of the central right renal collecting system and proximal right ureter w/o overt right hydronephrosis.   She was last seen by her endocrinologist 2 weeks ago where she reported of persistent dysuria and consistent UTI after sexual intercourse. She has discussed this before with OB/GYN. She was told that she had an UTI.  Urinalysis on this occasion on 12/02/2019 reviewed in [place] revealed completely negative dip but nitrate positive.  She was taking Pyridium at the time.  Urine culture was ultimately negative.  She reports of UTI once a month and was originally seen by [facility] but no records are present.   She reports that she often travels out of town to [place] and is treated by urgent cares at various locations.  She has taken probiotics before and also has experience with cranberry tablets 1x a day with no symptom relief. No issues with constipation or diarrhea.   She denies any UTI symptoms today including dysuria.   She is currently menstruating.  She has an IUD for birth control.  She believes that her symptoms are primarily triggered by sexual intercourse.  This is affecting her relationship.  PVR today 0  mL.   +Urine culture 11/08/19 indicative of E. Coli resistant to ampicillin, ampicillin/sulbactam, and trimethoprim.   PMH: Past Medical History:  Diagnosis Date  . Diabetes mellitus without complication ([place])   . DKA (diabetic ketoacidoses) ([place]) 02/25/2017  . Generalized anxiety disorder 01/31/2018  . Heart murmur    at birth  . Hypertension     Surgical History: Past Surgical History:  Procedure Laterality Date  . NO PAST SURGERIES      Home Medications:  Allergies as of 01/06/2020      Reactions   Sulfamethoxazole-trimethoprim Hives   Pravastatin Other (See Comments)      Medication List       Accurate as of January 06, 2020 12:42 PM. If you have any questions, ask your nurse or doctor.        STOP taking these medications   levofloxacin 250 MG tablet Commonly known as: LEVAQUIN Stopped by: Hollice Espy, MD   phenazopyridine 100 MG tablet Commonly known as: PYRIDIUM Stopped by: Hollice Espy, MD     TAKE these medications   amLODipine 5 MG tablet Commonly known as: NORVASC Take 1 tablet (5 mg total) by mouth daily.   FreeStyle Libre 2 Reader [place] Use to monitor blood glucose.   insulin aspart 100 UNIT/ML injection Commonly known as: novoLOG Inject 2 Units into the skin 3 (three) times daily with meals.   insulin detemir 100 UNIT/ML injection Commonly known as: LEVEMIR Inject 0.12 mLs (12 Units  total) into the skin 2 (two) times daily.   levonorgestrel 20 MCG/24HR IUD Commonly known as: MIRENA 1 each by Intrauterine route once.   pantoprazole 40 MG tablet Commonly known as: PROTONIX Take 1 tablet (40 mg total) by mouth daily.       Allergies:  Allergies  Allergen Reactions  . Sulfamethoxazole-Trimethoprim Hives  . Pravastatin Other (See Comments)    Family History: Family History  Problem Relation Age of Onset  . Diabetes Father   . Renal cancer Father   . Cancer Neg Hx     Social History:  reports that she has never smoked. She has  never used smokeless tobacco. She reports previous alcohol use. She reports that she does not use drugs.   Physical Exam: BP (!) 142/103   Pulse 82   Ht 5\' 6"  (1.676 m)   Wt 180 lb (81.6 kg)   LMP 01/06/2020   BMI 29.05 kg/m   Constitutional:  Alert and oriented, No acute distress. HEENT: [place] AT, moist mucus membranes.  Trachea midline, no masses. Cardiovascular: No clubbing, cyanosis, or edema. Respiratory: Normal respiratory effort, no increased work of breathing. Skin: No rashes, bruises or suspicious lesions. Neurologic: Grossly intact, no focal deficits, moving all 4 extremities. Psychiatric: Normal mood and affect.  Laboratory Data: Lab Results  Component Value Date   WBC 9.3 11/07/2019   HGB 9.6 (L) 11/07/2019   HCT 28.6 (L) 11/07/2019   MCV 92.3 11/07/2019   PLT 232 11/07/2019    Lab Results  Component Value Date   CREATININE 0.88 11/08/2019    Lab Results  Component Value Date   HGBA1C 7.1 (H) 11/05/2019    Urinalysis Currently menstruating; 3-10 RBC, few bacteria, and nitrate negative  Pertinent Imaging: Results for orders placed or performed in visit on 01/06/20  BLADDER SCAN AMB NON-IMAGING  Result Value Ref Range   Scan Result 0 ML     Results for orders placed during the hospital encounter of 11/05/19  CT Renal Stone Study   Narrative CLINICAL DATA:  Vomiting.  Mid back pain.  Flank pain.  EXAM: CT ABDOMEN AND PELVIS WITHOUT CONTRAST  TECHNIQUE: Multidetector CT imaging of the abdomen and pelvis was performed following the standard protocol without IV contrast.  COMPARISON:  None.  FINDINGS: Lower chest: No significant pulmonary nodules or acute consolidative airspace disease.  Hepatobiliary: Diffuse hepatic steatosis. Normal liver size. No definite liver surface irregularity. No liver masses. Normal gallbladder with no radiopaque cholelithiasis. No biliary ductal dilatation.  Pancreas: Normal, with no mass or duct dilation.   Spleen: Normal size. No mass.  Adrenals/Urinary Tract: Normal adrenals. Asymmetric mild fullness of the central right renal collecting system and proximal right ureter without overt right hydronephrosis. No left hydronephrosis. Normal caliber left ureter. No renal or ureteral stones. No contour deforming renal masses. Normal bladder.  Stomach/Bowel: Normal non-distended stomach. Normal caliber small bowel with no small bowel wall thickening. Normal appendix. Normal large bowel with no diverticulosis, large bowel wall thickening or pericolonic fat stranding.  Vascular/Lymphatic: Normal caliber abdominal aorta. No pathologically enlarged lymph nodes in the abdomen or pelvis.  Reproductive: Grossly normal anteverted uterus. Intrauterine device appears grossly well positioned within the uterine cavity. No adnexal mass.  Other: No pneumoperitoneum, ascites or focal fluid collection.  Musculoskeletal: No aggressive appearing focal osseous lesions.  IMPRESSION: 1. Asymmetric mild fullness of the central right renal collecting system and proximal right ureter without overt right hydronephrosis. No urolithiasis. 2. Diffuse hepatic steatosis.   Electronically  Signed   By: Delbert Phenix M.D.   On: 11/05/2019 17:12    I have personally reviewed the images and agree with radiologist interpretation.   Assessment & Plan:    1. rUTI Adequate bladder emptying  Currently menstruating; UA positive for 3-10 RBC, few bacteria and nitrate negative    Upper tract imaging unremarkable (sequela of infection at the time appreciated)  Lack of data such as urine cultures and UA so will need more data.  Differential diagnosis includes IC or other pelvic/bladder pain syndromes especially in light of her recent urine Dr. Pricilla Handler office which in fact did not show evidence of infection she was symptomatic at the time.  Advised pt to notify urgent care to send UA and culture if she sees them for UTI    Discussed behavioral modification and hygiene including wiping front to back, not using perfume soap and to empty bladder all the way, etc..   Advised pt to take daily probiotics and cranberry tablets bid   Also encouraged careful control of her blood sugar, type I diabetic risk factor for recurrent infections  If 3 documented UTIs in a 12 month period, will consider cysto and suppressive antibiotics   F/u when UTI symptoms return   Return if symptoms worsen or fail to improve, for UTI symptoms.  Vanna Scotland, MD  [facility] [address], [address] [place], [place] [address] [phone]  I spent 45 total minutes on the day of the encounter including pre-visit review of the medical record, face-to-face time with the patient, and post visit ordering of labs/imaging/tests.   Solon Augusta, am acting as a scribe for Dr. Vanna Scotland,  I have reviewed the above documentation for accuracy and completeness, and I agree with the above.   Vanna Scotland, MD

## 2020-01-06 ENCOUNTER — Ambulatory Visit (INDEPENDENT_AMBULATORY_CARE_PROVIDER_SITE_OTHER): Payer: BC Managed Care – PPO | Admitting: Urology

## 2020-01-06 ENCOUNTER — Other Ambulatory Visit: Payer: Self-pay

## 2020-01-06 ENCOUNTER — Encounter: Payer: Self-pay | Admitting: Urology

## 2020-01-06 VITALS — BP 142/103 | HR 82 | Ht 66.0 in | Wt 180.0 lb

## 2020-01-06 DIAGNOSIS — N390 Urinary tract infection, site not specified: Secondary | ICD-10-CM

## 2020-01-06 LAB — BLADDER SCAN AMB NON-IMAGING: Scan Result: 0

## 2020-01-07 LAB — MICROSCOPIC EXAMINATION

## 2020-01-07 LAB — URINALYSIS, COMPLETE
Bilirubin, UA: NEGATIVE
Glucose, UA: NEGATIVE
Ketones, UA: NEGATIVE
Nitrite, UA: NEGATIVE
Protein,UA: NEGATIVE
Specific Gravity, UA: 1.02 (ref 1.005–1.030)
Urobilinogen, Ur: 0.2 mg/dL (ref 0.2–1.0)
pH, UA: 6.5 (ref 5.0–7.5)

## 2020-01-16 ENCOUNTER — Other Ambulatory Visit: Payer: Self-pay

## 2020-01-16 ENCOUNTER — Encounter: Payer: Self-pay | Admitting: Urology

## 2020-01-16 ENCOUNTER — Ambulatory Visit (INDEPENDENT_AMBULATORY_CARE_PROVIDER_SITE_OTHER): Payer: BC Managed Care – PPO | Admitting: Urology

## 2020-01-16 VITALS — BP 131/87 | HR 87 | Ht 67.0 in | Wt 180.0 lb

## 2020-01-16 DIAGNOSIS — N390 Urinary tract infection, site not specified: Secondary | ICD-10-CM

## 2020-01-16 LAB — MICROSCOPIC EXAMINATION: RBC, Urine: NONE SEEN /hpf (ref 0–2)

## 2020-01-16 LAB — URINALYSIS, COMPLETE
Bilirubin, UA: NEGATIVE
Ketones, UA: NEGATIVE
Nitrite, UA: POSITIVE — AB
Protein,UA: NEGATIVE
RBC, UA: NEGATIVE
Specific Gravity, UA: 1.02 (ref 1.005–1.030)
Urobilinogen, Ur: 0.2 mg/dL (ref 0.2–1.0)
pH, UA: 6 (ref 5.0–7.5)

## 2020-01-16 NOTE — Progress Notes (Signed)
01/16/2020 10:36 AM   [name] 20-Nov-1995 [id number]  Referring provider: Jaclyn Shaggy, MD [address]   [place],  [place] [address]  Chief Complaint  Patient presents with  . Dysuria    HPI: [name] is a 23 year old Type 1 diabetic female with presumptive rUTI's who presents today for dysuria.  She was initially seen in the office with Dr. Apolinar Junes on 01/06/2020 as a referral from Dr. Tedd Sias for recurrent urinary tract infections.  At that visit, [name] stated she believes her urinary tract infections were precipitated by sexual intercourse.  She states that this was putting a strain on her relationship.    They discussed behavioral modification and hygiene techniques and Dr. Apolinar Junes advised her to take daily probiotics, cranberry tablets and also to continue careful control of her blood sugar as being diabetic puts her at greater risk for infections.  She presents today with a 3-day history of dysuria, urgency and frequency.  She states that she had diarrhea 1 week ago.  She has been taking AZO which has been helping with her symptoms.  Patient denies any modifying or aggravating factors.  Patient denies any gross hematuria, dysuria or suprapubic/flank pain.  Patient denies any fevers, chills, nausea or vomiting.   Her UA today was yellow cloudy, trace glucose, specific gravity 1.020, pH 6.0, nitrite positive, 1+ leukocyte, 6-10 WBC, 0-10 epithelial cells, 0-10 renal epithelial cells and few bacteria.    PMH: Past Medical History:  Diagnosis Date  . Diabetes mellitus without complication ([facility])   . DKA (diabetic ketoacidoses) ([facility]) 02/25/2017  . Generalized anxiety disorder 01/31/2018  . Heart murmur    at birth  . Hypertension     Surgical History: Past Surgical History:  Procedure Laterality Date  . NO PAST SURGERIES      Home Medications:  Allergies as of 01/16/2020      Reactions   Sulfamethoxazole-trimethoprim Hives   Pravastatin Other (See Comments)        Medication List       Accurate as of January 16, 2020 11:59 PM. If you have any questions, ask your nurse or doctor.        amLODipine 5 MG tablet Commonly known as: NORVASC Take 1 tablet (5 mg total) by mouth daily.   FreeStyle Libre 2 Reader [place] Use to monitor blood glucose.   insulin aspart 100 UNIT/ML injection Commonly known as: novoLOG Inject 2 Units into the skin 3 (three) times daily with meals.   insulin detemir 100 UNIT/ML injection Commonly known as: LEVEMIR Inject 0.12 mLs (12 Units total) into the skin 2 (two) times daily.   levonorgestrel 20 MCG/24HR IUD Commonly known as: MIRENA 1 each by Intrauterine route once.   pantoprazole 40 MG tablet Commonly known as: PROTONIX Take 1 tablet (40 mg total) by mouth daily.       Allergies:  Allergies  Allergen Reactions  . Sulfamethoxazole-Trimethoprim Hives  . Pravastatin Other (See Comments)    Family History: Family History  Problem Relation Age of Onset  . Diabetes Father   . Renal cancer Father   . Cancer Neg Hx     Social History:  reports that she has never smoked. She has never used smokeless tobacco. She reports previous alcohol use. She reports that she does not use drugs.  ROS: Pertinent ROS in HPI  Physical Exam: BP 131/87   Pulse 87   Ht 5\' 7"  (1.702 m)   Wt 180  lb (81.6 kg)   LMP 01/06/2020   BMI 28.19 kg/m   Constitutional:  Well nourished. Alert and oriented, No acute distress. HEENT: [place] AT, mask in place.  Trachea midline. Cardiovascular: No clubbing, cyanosis, or edema. Respiratory: Normal respiratory effort, no increased work of breathing. Neurologic: Grossly intact, no focal deficits, moving all 4 extremities. Psychiatric: Normal mood and affect.  Laboratory Data: Lab Results  Component Value Date   WBC 9.3 11/07/2019   HGB 9.6 (L) 11/07/2019   HCT 28.6 (L) 11/07/2019   MCV 92.3 11/07/2019   PLT 232 11/07/2019    Lab Results  Component Value Date   CREATININE  0.88 11/08/2019    Lab Results  Component Value Date   HGBA1C 7.1 (H) 11/05/2019    Lab Results  Component Value Date   TSH 0.253 (L) 11/05/2019       Component Value Date/Time   CHOL 281 (H) 08/26/2013 0333   HDL 33 (L) 08/26/2013 0333   VLDL SEE COMMENT 08/26/2013 0333   LDLCALC SEE COMMENT 08/26/2013 0333    Lab Results  Component Value Date   AST 20 11/05/2019   Lab Results  Component Value Date   ALT 21 11/05/2019    Urinalysis Component     Latest Ref Rng & Units 01/16/2020  Specific Gravity, UA     1.005 - 1.030 1.020  pH, UA     5.0 - 7.5 6.0  Color, UA     Yellow Yellow  Appearance Ur     Clear Cloudy (A)  Leukocytes,UA     Negative 1+ (A)  Protein,UA     Negative/Trace Negative  Glucose, UA     Negative Trace (A)  Ketones, UA     Negative Negative  RBC, UA     Negative Negative  Bilirubin, UA     Negative Negative  Urobilinogen, Ur     0.2 - 1.0 mg/dL 0.2  Nitrite, UA     Negative Positive (A)  Microscopic Examination      See below:   Component     Latest Ref Rng & Units 01/16/2020  WBC, UA     0 - 5 /hpf 6-10 (A)  RBC     0 - 2 /hpf None seen  Epithelial Cells (non renal)     0 - 10 /hpf 0-10  Renal Epithel, UA     None seen /hpf 0-10 (A)  Crystals     N/A Present (A)  Crystal Type     N/A Amorphous Sediment  Bacteria, UA     None seen/Few Few    I have reviewed the labs.   Pertinent Imaging: No imaging since last visit  Assessment & Plan:    1. Recurrent UTI - Urinalysis, Complete - nitrite positive likely due to AZO, will send for culture to rule out infection - we will hold on prescribing antibiotic until culture results are available  Return for Pending urine culture results .  These notes generated with voice recognition software. I apologize for typographical errors.  Zara Council, PA-C  [facility] [address]  [place],  [address] [phone]

## 2020-01-19 ENCOUNTER — Telehealth: Payer: Self-pay

## 2020-01-19 NOTE — Telephone Encounter (Signed)
-----   Message from Harle Battiest, PA-C sent at 01/19/2020  8:37 AM EDT ----- I have left a message for [name] to call us back.  I did not place the order for the urine culture on Friday, so her urine culture was not sent.  Please apologize for this.  If she is still having symptoms, I would recommend to drop off another urine for UA and culture.  If she is having an UTI, I would suspect the urine to look worse when compared to the one on Friday.

## 2020-01-19 NOTE — Telephone Encounter (Signed)
Left pt message to call °

## 2020-01-19 NOTE — Telephone Encounter (Signed)
called and stated all her symptoms have resolved, she doesn't feel she needs to send off another sample, I advised pt to please call us if symtoms return

## 2021-02-03 IMAGING — DX DG CHEST 1V
1 series · 1 of 1 positions shown · non-contrast
Comparison: 02/25/2017

CLINICAL DATA: Dyspnea

EXAM:
CHEST  1 VIEW

[chest ap]
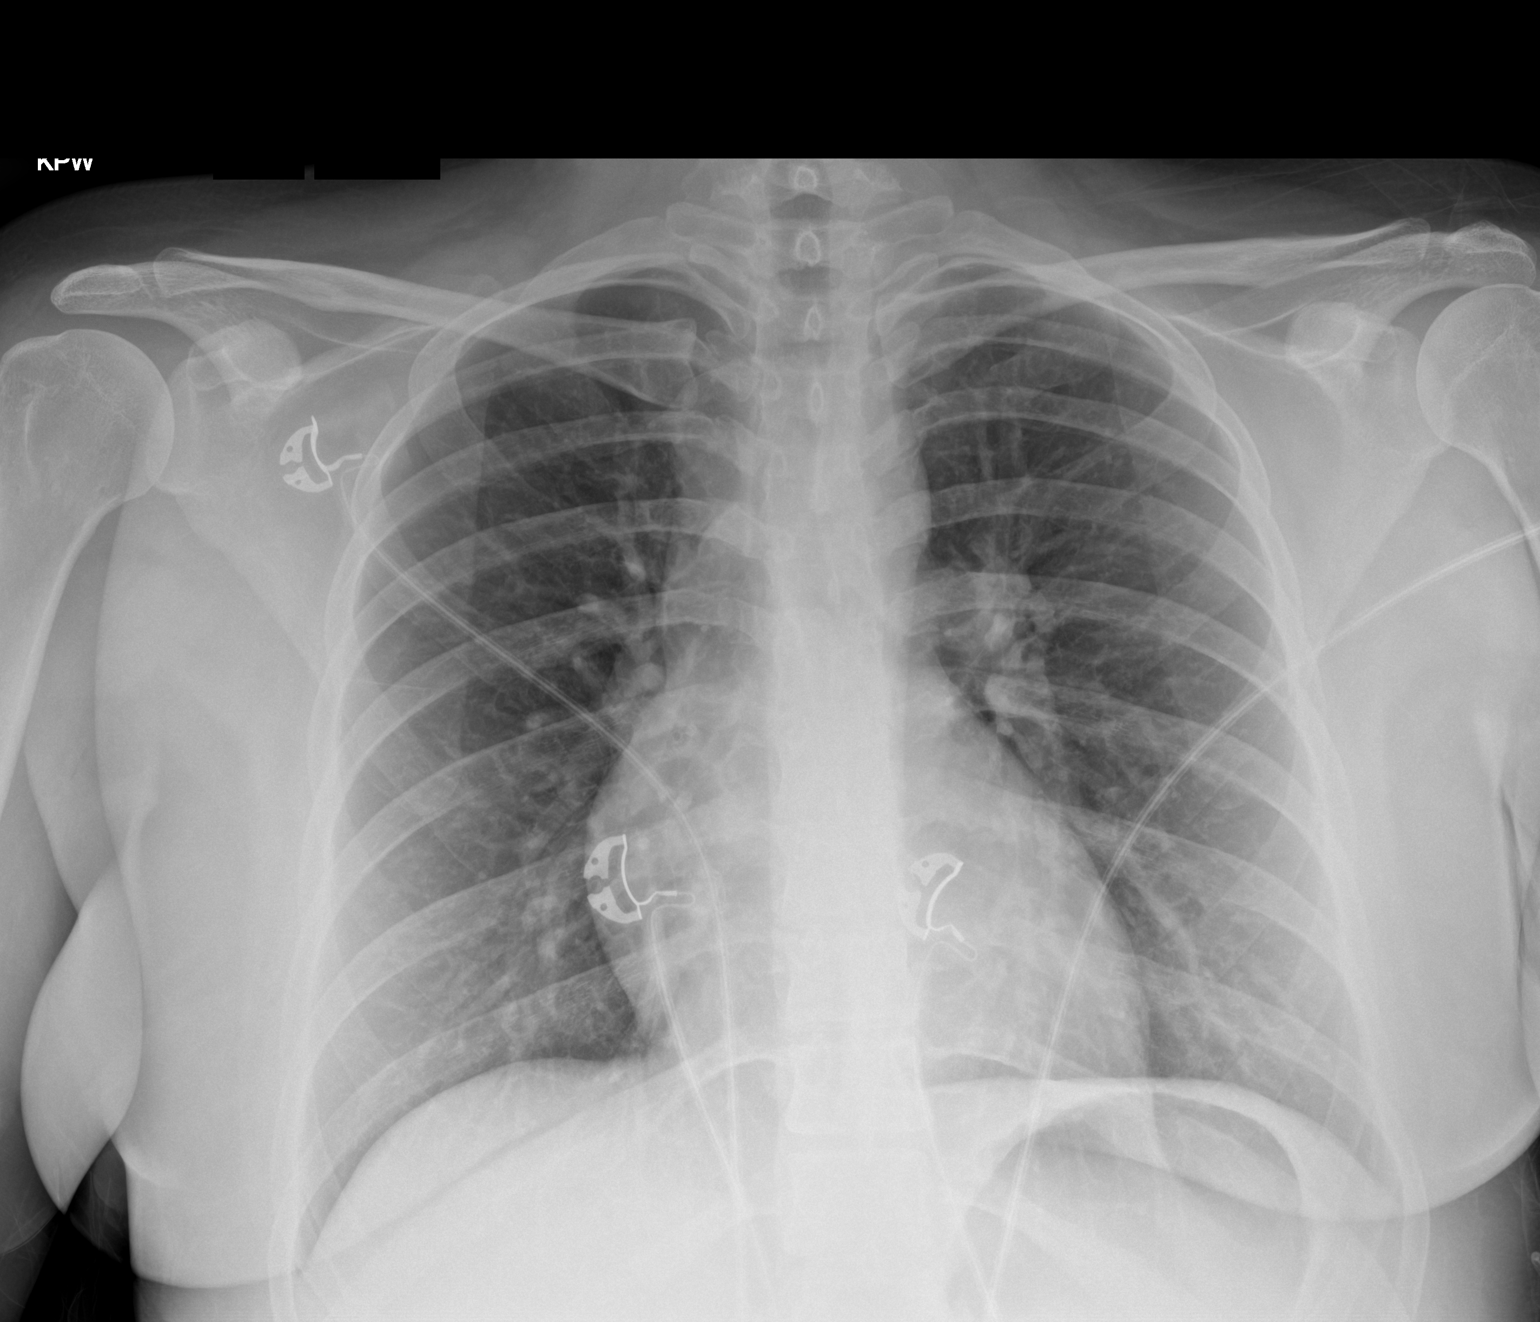

[1 of 1 positions shown; findings below may reference images not displayed]

FINDINGS: The heart size and mediastinal contours are within normal limits.
Both lungs are clear. The visualized skeletal structures are
unremarkable.
IMPRESSION: No active disease.

## 2021-02-03 IMAGING — CT CT RENAL STONE PROTOCOL
3 of 4 series · 8 of 46 positions shown, 15 images · non-contrast
Comparison: None.

CLINICAL DATA: Vomiting.  Mid back pain.  Flank pain.

EXAM:
CT ABDOMEN AND PELVIS WITHOUT CONTRAST
TECHNIQUE: Multidetector CT imaging of the abdomen and pelvis was performed
following the standard protocol without IV contrast.

[Series 4: lung bases · axial · 0.82mm/px · z∈[-111,-51]mm · 4 of 22 slices shown, 9 images]
[im 5/22  soft-tissue]
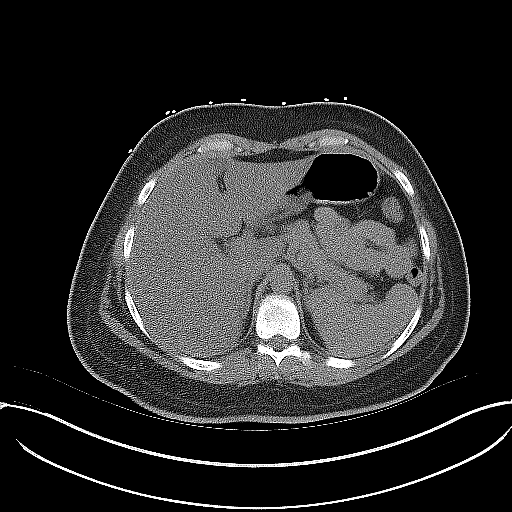
[im 5/22  lung]
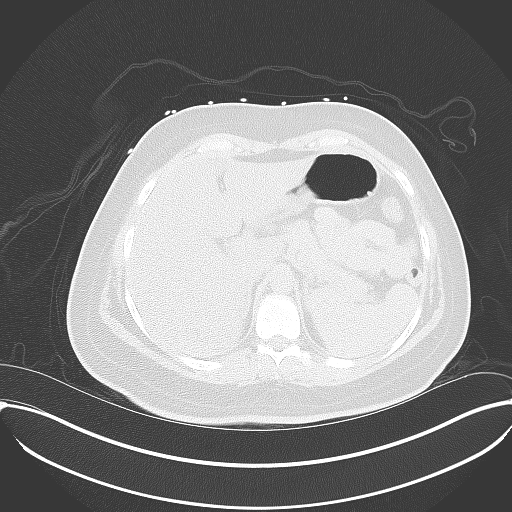
[im 5/22  bone]
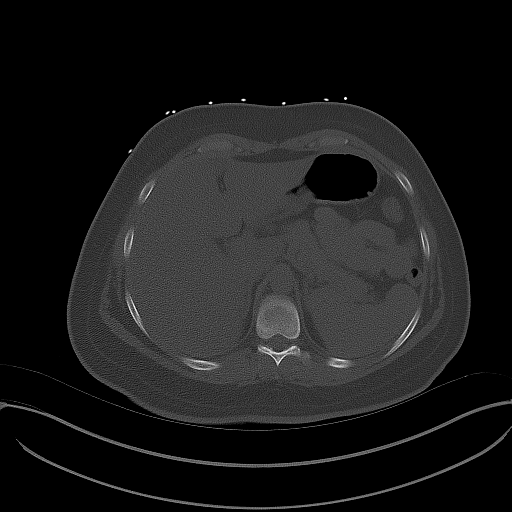
[im 9/22  soft-tissue]
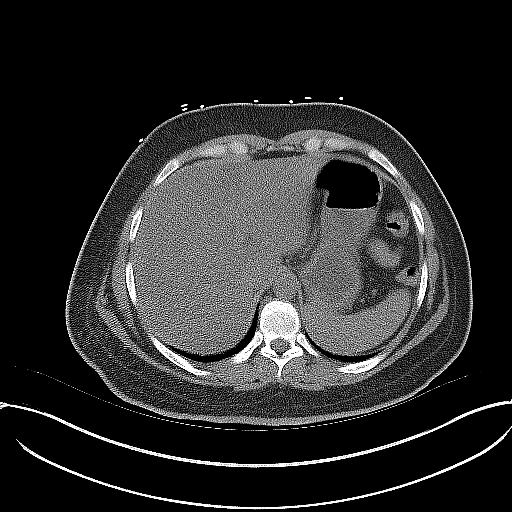
[im 9/22  lung]
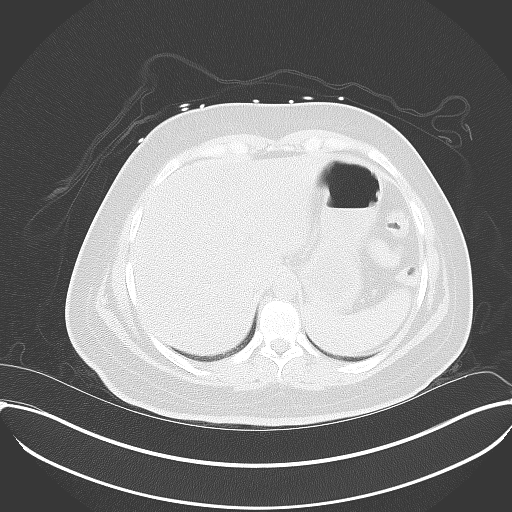
[im 13/22  soft-tissue]
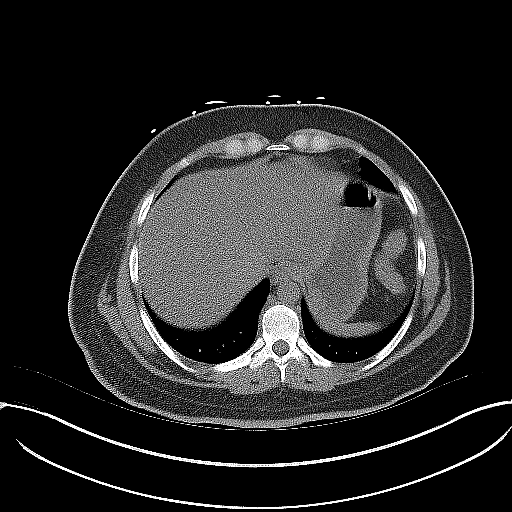
[im 13/22  lung]
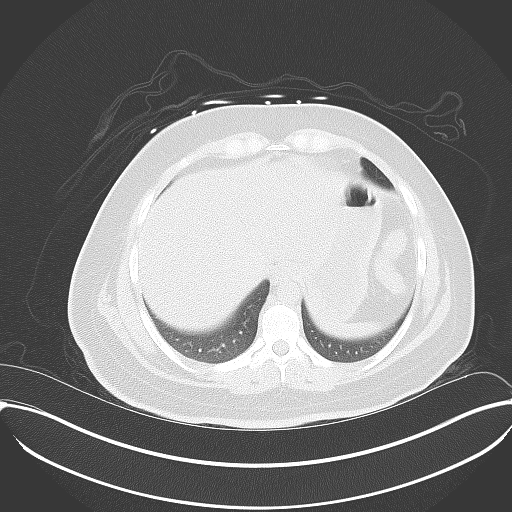
[im 17/22  soft-tissue]
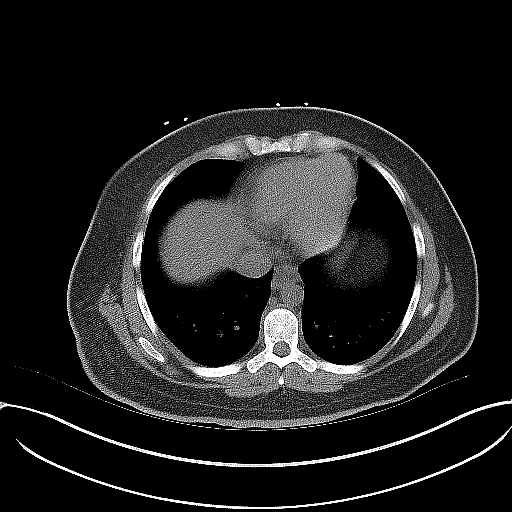
[im 17/22  lung]
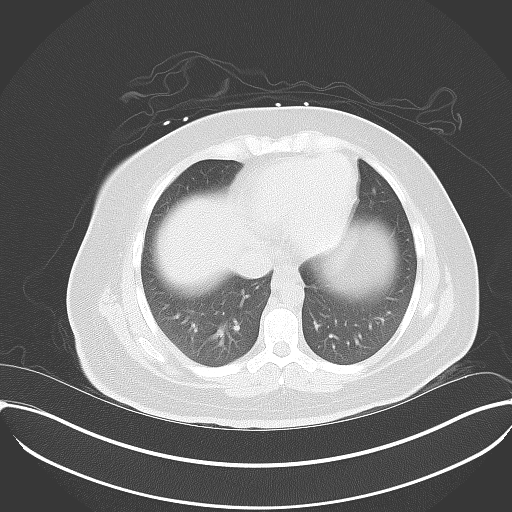

[Series 5: coronal · coronal · 0.88mm/px · 3 of 126 slices shown, 4 images]
[im 42/126  soft-tissue]
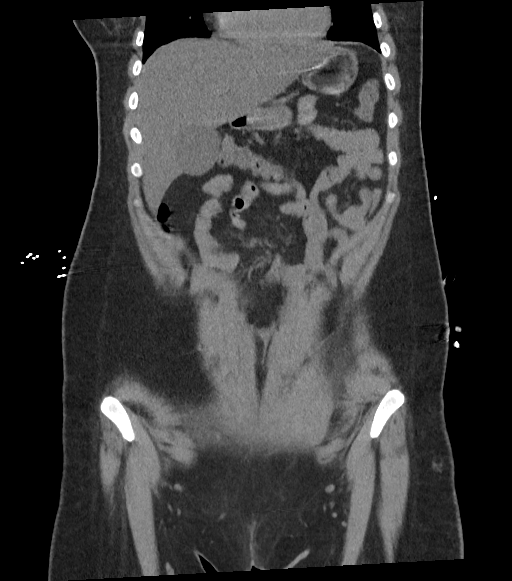
[im 56/126  soft-tissue]
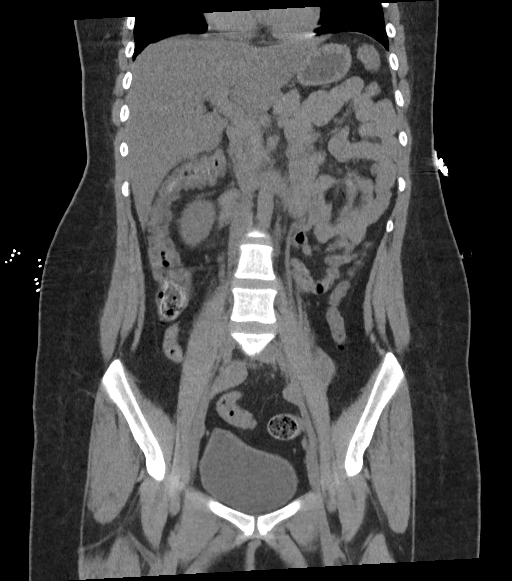
[im 56/126  bone]
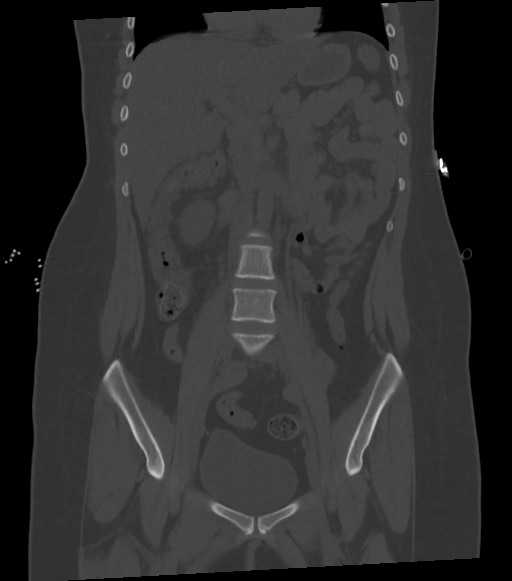
[im 70/126  soft-tissue]
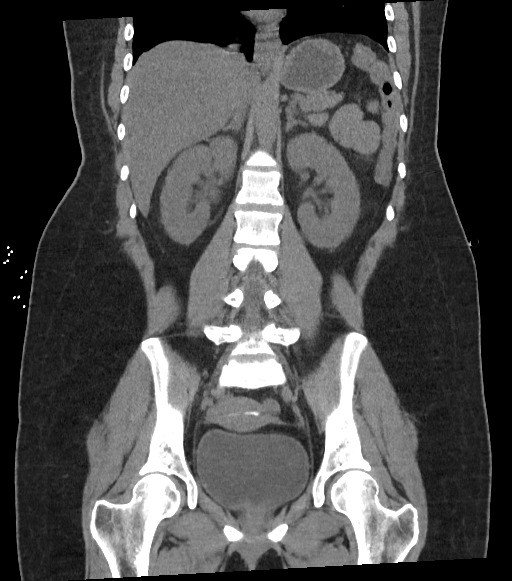

[Series 6: sagittal · sagittal · 0.65mm/px · 1 of 189 slices shown, 2 images]
[im 63/189  soft-tissue]
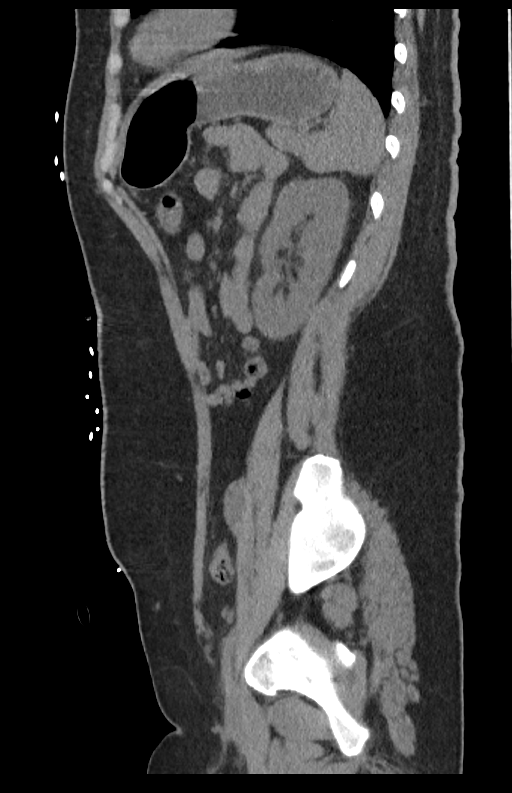
[im 63/189  bone]
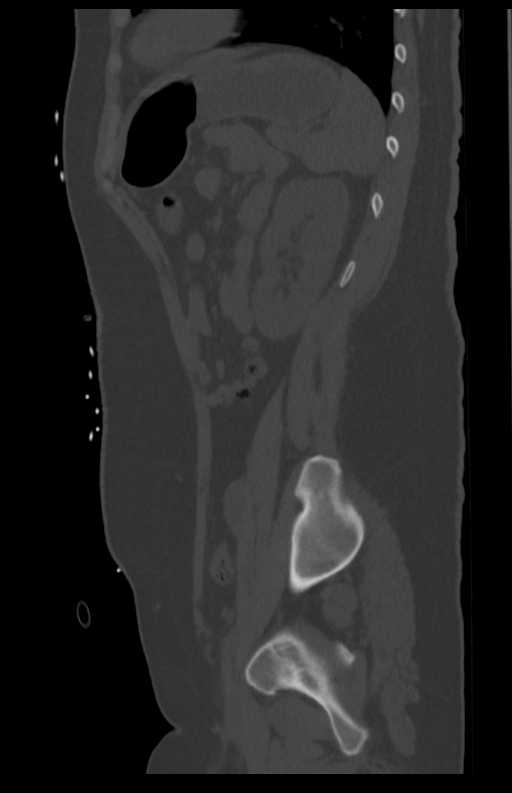

[8 of 46 positions shown; findings below may reference images not displayed]

FINDINGS: Lower chest: No significant pulmonary nodules or acute consolidative
airspace disease.

Hepatobiliary: Diffuse hepatic steatosis. Normal liver size. No
definite liver surface irregularity. No liver masses. Normal
gallbladder with no radiopaque cholelithiasis. No biliary ductal
dilatation.

Pancreas: Normal, with no mass or duct dilation.

Spleen: Normal size. No mass.

Adrenals/Urinary Tract: Normal adrenals. Asymmetric mild fullness of
the central right renal collecting system and proximal right ureter
without overt right hydronephrosis. No left hydronephrosis. Normal
caliber left ureter. No renal or ureteral stones. No contour
deforming renal masses. Normal bladder.

Stomach/Bowel: Normal non-distended stomach. Normal caliber small
bowel with no small bowel wall thickening. Normal appendix. Normal
large bowel with no diverticulosis, large bowel wall thickening or
pericolonic fat stranding.

Vascular/Lymphatic: Normal caliber abdominal aorta. No
pathologically enlarged lymph nodes in the abdomen or pelvis.

Reproductive: Grossly normal anteverted uterus. Intrauterine device
appears grossly well positioned within the uterine cavity. No
adnexal mass.

Other: No pneumoperitoneum, ascites or focal fluid collection.

Musculoskeletal: No aggressive appearing focal osseous lesions.
IMPRESSION: 1. Asymmetric mild fullness of the central right renal collecting
system and proximal right ureter without overt right hydronephrosis.
No urolithiasis.
2. Diffuse hepatic steatosis.

## 2021-06-13 ENCOUNTER — Ambulatory Visit
Admission: EM | Admit: 2021-06-13 | Discharge: 2021-06-13 | Disposition: A | Discharge status: Home / Self Care | Payer: Medicaid Other | Attending: Physician Assistant | Admitting: Physician Assistant

## 2021-06-13 ENCOUNTER — Other Ambulatory Visit: Payer: Self-pay

## 2021-06-13 DIAGNOSIS — N3090 Cystitis, unspecified without hematuria: Secondary | ICD-10-CM | POA: Insufficient documentation

## 2021-06-13 DIAGNOSIS — N390 Urinary tract infection, site not specified: Secondary | ICD-10-CM | POA: Diagnosis present

## 2021-06-13 LAB — URINALYSIS, COMPLETE (UACMP) WITH MICROSCOPIC: WBC, UA: >50 WBC/hpf (ref 0–5)

## 2021-06-13 MED ORDER — NITROFURANTOIN MONOHYD MACRO 100 MG PO CAPS: 100.0000 mg | ORAL_CAPSULE | Freq: Two times a day (BID) | ORAL | 0 refills | Status: DC

## 2021-06-13 NOTE — Discharge Instructions (Signed)
-  Macrobid: 1 tablet twice a day for 5 days -Ibuprofen Tylenol as needed for pain -Can also continue the Azo for bladder pain -Push fluids -Follow-up with PCP or this clinic as needed should symptoms worsen or not improve.

## 2021-06-13 NOTE — ED Triage Notes (Signed)
Pt presents with c/o dysuria that began yesterday  

## 2021-06-13 NOTE — ED Provider Notes (Signed)
[facility]    CSN: [id number] Arrival date & time: 06/13/21  1453      History   Chief Complaint Chief Complaint  Patient presents with   Dysuria    HPI [name] is a 25 y.o. female.   Patient is a 25 year old female who presents with complaint of burning with urination and urinary frequency that started 2 days ago.  Patient has been taking Azo but no other medications.  Denies any fever or chills.  She reports allergy to Bactrim.  Patient denies any flank pain.  Patient also denies any nausea or vomiting.   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/2018   Elevated blood pressure affecting pregnancy in third trimester, antepartum 06/20/2018   Labor and delivery indication for care or intervention 06/20/2018   PIH (pregnancy induced hypertension) 06/20/2018   Gastroesophageal reflux in pregnancy 05/13/2018   Type 1 diabetes mellitus without complication (HCC) 03/07/2018   Generalized anxiety disorder 01/31/2018   Immune deficiency disorder (HCC) 01/05/2018   High risk pregnancy, antepartum 12/20/2017   AKI (acute kidney injury) (HCC)     Past Surgical History:  Procedure Laterality Date   NO PAST SURGERIES      OB History     Gravida  1   Para  1   Term  1   Preterm      AB      Living  1      SAB      IAB      Ectopic      Multiple  0   Live Births  1            Home Medications    Prior to Admission medications   Medication Sig Start Date End Date Taking? Authorizing Provider  losartan (COZAAR) 100 MG tablet Take by mouth. 11/16/20 11/16/21 Yes [provider]   nitrofurantoin, macrocrystal-monohydrate, (MACROBID) 100 MG capsule Take 1 capsule (100 mg total) by mouth 2 (two) times daily. 06/13/21  Yes Candis Schatz, PA-C  Continuous Blood Gluc Receiver (FREESTYLE LIBRE 2 READER) DEVI Use to monitor blood glucose. 08/08/19   [provider]  insulin aspart (NOVOLOG) 100 UNIT/ML injection Inject 2 Units into the skin 3 (three) times daily with meals. 11/08/19   Lynn Ito, MD  insulin detemir (LEVEMIR) 100 UNIT/ML injection Inject 0.12 mLs (12 Units total) into the skin 2 (two) times daily. 11/08/19   Lynn Ito, MD  levonorgestrel (MIRENA) 20 MCG/24HR IUD 1 each by Intrauterine route once.    [provider]  pantoprazole (PROTONIX) 40 MG tablet Take 1 tablet (40 mg total) by mouth daily. 11/09/19   Lynn Ito, MD    Family History Family History  Problem Relation Age of Onset   Diabetes Father    Renal cancer Father    Cancer Neg Hx     Social History Social History   Tobacco Use   Smoking status: Never   Smokeless tobacco: Never  Substance Use Topics   Alcohol use: Not Currently   Drug use: Never     Allergies   Sulfamethoxazole-trimethoprim and Pravastatin  Review of Systems Review of Systems as noted on HPI.  Other systems reviewed and found to be negative   Physical Exam Triage Vital Signs ED Triage Vitals [06/13/21 1521]  Enc Vitals Group     BP      Pulse      Resp      Temp      Temp src      SpO2      Weight      Height      Head Circumference      Peak Flow      Pain Score 0     Pain Loc      Pain Edu?      Excl. in GC?    No data found.  Updated Vital Signs There were no vitals taken for this visit.   Physical Exam Constitutional:      Appearance: Normal appearance. She is normal weight. She is not ill-appearing.  Cardiovascular:     Rate and Rhythm: Normal rate and regular rhythm.  Pulmonary:     Effort: Pulmonary effort is normal.  Abdominal:     General: Abdomen is  flat.     Palpations: Abdomen is soft.     Tenderness: There is abdominal tenderness (tenderness over bladder). There is no right CVA tenderness or left CVA tenderness.  Skin:    General: Skin is warm and dry.     Capillary Refill: Capillary refill takes less than 2 seconds.  Neurological:     General: No focal deficit present.     Mental Status: She is alert and oriented to person, place, and time.  Psychiatric:        Mood and Affect: Mood normal.        Behavior: Behavior normal.     UC Treatments / Results  Labs (all labs ordered are listed, but only abnormal results are displayed) Labs Reviewed  URINALYSIS, COMPLETE (UACMP) WITH MICROSCOPIC - Abnormal; Notable for the following components:      Result Value   Color, Urine ORANGE (*)    APPearance HAZY (*)    Glucose, UA   (*)    Value: TEST NOT REPORTED DUE TO COLOR INTERFERENCE OF URINE PIGMENT   Hgb urine dipstick   (*)    Value: TEST NOT REPORTED DUE TO COLOR INTERFERENCE OF URINE PIGMENT   Bilirubin Urine   (*)    Value: TEST NOT REPORTED DUE TO COLOR INTERFERENCE OF URINE PIGMENT   Ketones, ur   (*)    Value: TEST NOT REPORTED DUE TO COLOR INTERFERENCE OF URINE PIGMENT   Protein, ur   (*)    Value: TEST NOT REPORTED DUE TO COLOR INTERFERENCE OF URINE PIGMENT   Nitrite   (*)    Value: TEST NOT REPORTED DUE TO COLOR INTERFERENCE OF URINE PIGMENT   Leukocytes,Ua   (*)    Value: TEST NOT REPORTED DUE TO COLOR INTERFERENCE OF URINE PIGMENT   Bacteria, UA MANY (*)    All other components within normal limits  URINE CULTURE    EKG   Radiology No results found.  Procedures Procedures (including critical care time)  Medications Ordered in UC Medications - No data to display  Initial Impression / Assessment and Plan / UC Course  I have reviewed the triage vital signs and the nursing notes.  Pertinent labs & imaging results that were available during my care of the patient were reviewed by me and considered in  my  medical decision making (see chart for details).    Patient with urinary frequency and burning x2 days.  UA with many bacteria, other tests not performed due to coloration from the Azo.  We will send for culture.  Prescription for Macrobid.  Push fluids.  Ibuprofen Tylenol for pain.  Final Clinical Impressions(s) / UC Diagnoses   Final diagnoses:  Lower urinary tract infectious disease  Cystitis     Discharge Instructions      -Macrobid: 1 tablet twice a day for 5 days -Ibuprofen Tylenol as needed for pain -Can also continue the Azo for bladder pain -Push fluids -Follow-up with PCP or this clinic as needed should symptoms worsen or not improve.     ED Prescriptions     Medication Sig Dispense Auth. Provider   nitrofurantoin, macrocrystal-monohydrate, (MACROBID) 100 MG capsule Take 1 capsule (100 mg total) by mouth 2 (two) times daily. 10 capsule Candis Schatz, PA-C      PDMP not reviewed this encounter.   Candis Schatz, PA-C 06/13/21 1813

## 2021-06-15 LAB — URINE CULTURE

## 2022-04-24 ENCOUNTER — Ambulatory Visit
Admission: EM | Admit: 2022-04-24 | Discharge: 2022-04-24 | Disposition: A | Discharge status: Home / Self Care | Payer: Medicaid Other | Attending: Emergency Medicine | Admitting: Emergency Medicine

## 2022-04-24 DIAGNOSIS — N3001 Acute cystitis with hematuria: Secondary | ICD-10-CM | POA: Diagnosis present

## 2022-04-24 LAB — URINALYSIS, ROUTINE W REFLEX MICROSCOPIC
Bilirubin Urine: NEGATIVE
Glucose, UA: 100 mg/dL — AB
Nitrite: POSITIVE — AB
Protein, ur: >300 mg/dL — AB
Specific Gravity, Urine: 1.015 (ref 1.005–1.030)
pH: 5 (ref 5.0–8.0)

## 2022-04-24 LAB — URINALYSIS, MICROSCOPIC (REFLEX): WBC, UA: >50 WBC/hpf (ref 0–5)

## 2022-04-24 MED ORDER — PHENAZOPYRIDINE HCL 200 MG PO TABS: 200.0000 mg | ORAL_TABLET | Freq: Three times a day (TID) | ORAL | 0 refills | Status: DC

## 2022-04-24 MED ORDER — FLUCONAZOLE 200 MG PO TABS: 200.0000 mg | ORAL_TABLET | Freq: Every day | ORAL | 1 refills | Status: AC

## 2022-04-24 MED ORDER — CEFDINIR 300 MG PO CAPS: 300.0000 mg | ORAL_CAPSULE | Freq: Two times a day (BID) | ORAL | 0 refills | Status: AC

## 2022-04-26 LAB — URINE CULTURE: Culture: >=100000 — AB

## 2022-08-28 ENCOUNTER — Inpatient Hospital Stay
Admission: EM | Admit: 2022-08-28 | Discharge: 2022-09-03 | DRG: 637 | Disposition: A | Discharge status: Home / Self Care | Payer: Medicaid Other | Attending: Internal Medicine | Admitting: Internal Medicine

## 2022-08-28 ENCOUNTER — Emergency Department: Payer: Medicaid Other

## 2022-08-28 DIAGNOSIS — E860 Dehydration: Secondary | ICD-10-CM | POA: Diagnosis present

## 2022-08-28 DIAGNOSIS — J189 Pneumonia, unspecified organism: Secondary | ICD-10-CM | POA: Diagnosis present

## 2022-08-28 DIAGNOSIS — D649 Anemia, unspecified: Secondary | ICD-10-CM | POA: Diagnosis present

## 2022-08-28 DIAGNOSIS — Z794 Long term (current) use of insulin: Secondary | ICD-10-CM | POA: Diagnosis not present

## 2022-08-28 DIAGNOSIS — E10649 Type 1 diabetes mellitus with hypoglycemia without coma: Secondary | ICD-10-CM | POA: Diagnosis not present

## 2022-08-28 DIAGNOSIS — R4182 Altered mental status, unspecified: Secondary | ICD-10-CM | POA: Diagnosis not present

## 2022-08-28 DIAGNOSIS — Z20822 Contact with and (suspected) exposure to covid-19: Secondary | ICD-10-CM | POA: Diagnosis present

## 2022-08-28 DIAGNOSIS — Z8051 Family history of malignant neoplasm of kidney: Secondary | ICD-10-CM | POA: Diagnosis not present

## 2022-08-28 DIAGNOSIS — I110 Hypertensive heart disease with heart failure: Secondary | ICD-10-CM | POA: Diagnosis present

## 2022-08-28 DIAGNOSIS — G9341 Metabolic encephalopathy: Secondary | ICD-10-CM | POA: Diagnosis present

## 2022-08-28 DIAGNOSIS — E1011 Type 1 diabetes mellitus with ketoacidosis with coma: Secondary | ICD-10-CM | POA: Diagnosis not present

## 2022-08-28 DIAGNOSIS — D75838 Other thrombocytosis: Secondary | ICD-10-CM | POA: Diagnosis present

## 2022-08-28 DIAGNOSIS — T383X6A Underdosing of insulin and oral hypoglycemic [antidiabetic] drugs, initial encounter: Secondary | ICD-10-CM | POA: Diagnosis present

## 2022-08-28 DIAGNOSIS — Z6825 Body mass index (BMI) 25.0-25.9, adult: Secondary | ICD-10-CM | POA: Diagnosis not present

## 2022-08-28 DIAGNOSIS — D72829 Elevated white blood cell count, unspecified: Secondary | ICD-10-CM

## 2022-08-28 DIAGNOSIS — E875 Hyperkalemia: Secondary | ICD-10-CM | POA: Diagnosis present

## 2022-08-28 DIAGNOSIS — F411 Generalized anxiety disorder: Secondary | ICD-10-CM | POA: Diagnosis present

## 2022-08-28 DIAGNOSIS — E663 Overweight: Secondary | ICD-10-CM | POA: Diagnosis present

## 2022-08-28 DIAGNOSIS — E876 Hypokalemia: Secondary | ICD-10-CM | POA: Diagnosis not present

## 2022-08-28 DIAGNOSIS — Z833 Family history of diabetes mellitus: Secondary | ICD-10-CM | POA: Diagnosis not present

## 2022-08-28 DIAGNOSIS — E1065 Type 1 diabetes mellitus with hyperglycemia: Secondary | ICD-10-CM | POA: Diagnosis not present

## 2022-08-28 DIAGNOSIS — I5032 Chronic diastolic (congestive) heart failure: Secondary | ICD-10-CM | POA: Diagnosis present

## 2022-08-28 DIAGNOSIS — E872 Acidosis, unspecified: Secondary | ICD-10-CM | POA: Diagnosis present

## 2022-08-28 DIAGNOSIS — N179 Acute kidney failure, unspecified: Secondary | ICD-10-CM | POA: Diagnosis present

## 2022-08-28 DIAGNOSIS — E1010 Type 1 diabetes mellitus with ketoacidosis without coma: Principal | ICD-10-CM | POA: Diagnosis present

## 2022-08-28 DIAGNOSIS — R400 Somnolence: Secondary | ICD-10-CM | POA: Diagnosis not present

## 2022-08-28 DIAGNOSIS — Z634 Disappearance and death of family member: Secondary | ICD-10-CM

## 2022-08-28 DIAGNOSIS — E0811 Diabetes mellitus due to underlying condition with ketoacidosis with coma: Secondary | ICD-10-CM | POA: Diagnosis not present

## 2022-08-28 DIAGNOSIS — E871 Hypo-osmolality and hyponatremia: Secondary | ICD-10-CM | POA: Diagnosis present

## 2022-08-28 DIAGNOSIS — E1110 Type 2 diabetes mellitus with ketoacidosis without coma: Secondary | ICD-10-CM | POA: Diagnosis present

## 2022-08-28 DIAGNOSIS — Z91128 Patient's intentional underdosing of medication regimen for other reason: Secondary | ICD-10-CM | POA: Diagnosis not present

## 2022-08-28 DIAGNOSIS — N170 Acute kidney failure with tubular necrosis: Secondary | ICD-10-CM | POA: Diagnosis not present

## 2022-08-28 LAB — URINE DRUG SCREEN, QUALITATIVE (ARMC ONLY)
Amphetamines, Ur Screen: NOT DETECTED
Barbiturates, Ur Screen: NOT DETECTED
Benzodiazepine, Ur Scrn: NOT DETECTED
Cannabinoid 50 Ng, Ur ~~LOC~~: POSITIVE — AB
Cocaine Metabolite,Ur ~~LOC~~: NOT DETECTED
MDMA (Ecstasy)Ur Screen: NOT DETECTED
Methadone Scn, Ur: NOT DETECTED
Opiate, Ur Screen: NOT DETECTED
Phencyclidine (PCP) Ur S: NOT DETECTED
Tricyclic, Ur Screen: NOT DETECTED

## 2022-08-28 LAB — BETA-HYDROXYBUTYRIC ACID
Beta-Hydroxybutyric Acid: >8 mmol/L — ABNORMAL HIGH (ref 0.05–0.27)
Beta-Hydroxybutyric Acid: >8 mmol/L — ABNORMAL HIGH (ref 0.05–0.27)
Beta-Hydroxybutyric Acid: 4.64 mmol/L — ABNORMAL HIGH (ref 0.05–0.27)
Beta-Hydroxybutyric Acid: 0.9 mmol/L — ABNORMAL HIGH (ref 0.05–0.27)

## 2022-08-28 LAB — GLUCOSE, CAPILLARY
Glucose-Capillary: 110 mg/dL — ABNORMAL HIGH (ref 70–99)
Glucose-Capillary: 125 mg/dL — ABNORMAL HIGH (ref 70–99)
Glucose-Capillary: 142 mg/dL — ABNORMAL HIGH (ref 70–99)
Glucose-Capillary: 142 mg/dL — ABNORMAL HIGH (ref 70–99)
Glucose-Capillary: 192 mg/dL — ABNORMAL HIGH (ref 70–99)
Glucose-Capillary: 198 mg/dL — ABNORMAL HIGH (ref 70–99)
Glucose-Capillary: 206 mg/dL — ABNORMAL HIGH (ref 70–99)
Glucose-Capillary: 229 mg/dL — ABNORMAL HIGH (ref 70–99)
Glucose-Capillary: 230 mg/dL — ABNORMAL HIGH (ref 70–99)
Glucose-Capillary: 235 mg/dL — ABNORMAL HIGH (ref 70–99)
Glucose-Capillary: 320 mg/dL — ABNORMAL HIGH (ref 70–99)
Glucose-Capillary: 321 mg/dL — ABNORMAL HIGH (ref 70–99)
Glucose-Capillary: 326 mg/dL — ABNORMAL HIGH (ref 70–99)
Glucose-Capillary: 369 mg/dL — ABNORMAL HIGH (ref 70–99)
Glucose-Capillary: 451 mg/dL — ABNORMAL HIGH (ref 70–99)
Glucose-Capillary: 472 mg/dL — ABNORMAL HIGH (ref 70–99)
Glucose-Capillary: 504 mg/dL (ref 70–99)
Glucose-Capillary: 522 mg/dL (ref 70–99)
Glucose-Capillary: 596 mg/dL (ref 70–99)
Glucose-Capillary: >600 mg/dL (ref 70–99)

## 2022-08-28 LAB — COMPREHENSIVE METABOLIC PANEL
ALT: 32 U/L (ref 0–44)
AST: 52 U/L — ABNORMAL HIGH (ref 15–41)
Albumin: 4.1 g/dL (ref 3.5–5.0)
Alkaline Phosphatase: 98 U/L (ref 38–126)
BUN: 34 mg/dL — ABNORMAL HIGH (ref 6–20)
CO2: <7 mmol/L — ABNORMAL LOW (ref 22–32)
Calcium: 8.8 mg/dL — ABNORMAL LOW (ref 8.9–10.3)
Chloride: 95 mmol/L — ABNORMAL LOW (ref 98–111)
Creatinine, Ser: 2.21 mg/dL — ABNORMAL HIGH (ref 0.44–1.00)
GFR, Estimated: 30 mL/min — ABNORMAL LOW (ref >60)
Glucose, Bld: 858 mg/dL (ref 70–99)
Potassium: 5.9 mmol/L — ABNORMAL HIGH (ref 3.5–5.1)
Sodium: 136 mmol/L (ref 135–145)
Total Bilirubin: 2.1 mg/dL — ABNORMAL HIGH (ref 0.3–1.2)
Total Protein: 7.6 g/dL (ref 6.5–8.1)

## 2022-08-28 LAB — BASIC METABOLIC PANEL
Anion gap: 26 — ABNORMAL HIGH (ref 5–15)
Anion gap: 17 — ABNORMAL HIGH (ref 5–15)
Anion gap: 11 (ref 5–15)
Anion gap: 8 (ref 5–15)
BUN: 33 mg/dL — ABNORMAL HIGH (ref 6–20)
BUN: 30 mg/dL — ABNORMAL HIGH (ref 6–20)
BUN: 26 mg/dL — ABNORMAL HIGH (ref 6–20)
BUN: 26 mg/dL — ABNORMAL HIGH (ref 6–20)
CO2: 13 mmol/L — ABNORMAL LOW (ref 22–32)
CO2: 23 mmol/L (ref 22–32)
CO2: 30 mmol/L (ref 22–32)
CO2: 31 mmol/L (ref 22–32)
Calcium: 8 mg/dL — ABNORMAL LOW (ref 8.9–10.3)
Calcium: 8 mg/dL — ABNORMAL LOW (ref 8.9–10.3)
Calcium: 7.8 mg/dL — ABNORMAL LOW (ref 8.9–10.3)
Calcium: 7.7 mg/dL — ABNORMAL LOW (ref 8.9–10.3)
Chloride: 103 mmol/L (ref 98–111)
Chloride: 105 mmol/L (ref 98–111)
Chloride: 104 mmol/L (ref 98–111)
Chloride: 105 mmol/L (ref 98–111)
Creatinine, Ser: 2.31 mg/dL — ABNORMAL HIGH (ref 0.44–1.00)
Creatinine, Ser: 1.83 mg/dL — ABNORMAL HIGH (ref 0.44–1.00)
Creatinine, Ser: 1.5 mg/dL — ABNORMAL HIGH (ref 0.44–1.00)
Creatinine, Ser: 1.27 mg/dL — ABNORMAL HIGH (ref 0.44–1.00)
GFR, Estimated: 29 mL/min — ABNORMAL LOW (ref >60)
GFR, Estimated: 38 mL/min — ABNORMAL LOW (ref >60)
GFR, Estimated: 48 mL/min — ABNORMAL LOW (ref >60)
GFR, Estimated: 59 mL/min — ABNORMAL LOW (ref >60)
Glucose, Bld: 461 mg/dL — ABNORMAL HIGH (ref 70–99)
Glucose, Bld: 312 mg/dL — ABNORMAL HIGH (ref 70–99)
Glucose, Bld: 164 mg/dL — ABNORMAL HIGH (ref 70–99)
Glucose, Bld: 144 mg/dL — ABNORMAL HIGH (ref 70–99)
Potassium: 4.9 mmol/L (ref 3.5–5.1)
Potassium: 3.8 mmol/L (ref 3.5–5.1)
Potassium: 3.5 mmol/L (ref 3.5–5.1)
Potassium: 3.2 mmol/L — ABNORMAL LOW (ref 3.5–5.1)
Sodium: 142 mmol/L (ref 135–145)
Sodium: 145 mmol/L (ref 135–145)
Sodium: 145 mmol/L (ref 135–145)
Sodium: 144 mmol/L (ref 135–145)

## 2022-08-28 LAB — CBC WITH DIFFERENTIAL/PLATELET
Abs Immature Granulocytes: 0.6 10*3/uL — ABNORMAL HIGH (ref 0.00–0.07)
Basophils Absolute: 0.1 10*3/uL (ref 0.0–0.1)
Basophils Relative: 1 %
Eosinophils Absolute: 0 10*3/uL (ref 0.0–0.5)
Eosinophils Relative: 0 %
HCT: 41.3 % (ref 36.0–46.0)
Hemoglobin: 13 g/dL (ref 12.0–15.0)
Immature Granulocytes: 3 %
Lymphocytes Relative: 13 %
Lymphs Abs: 3.1 10*3/uL (ref 0.7–4.0)
MCH: 32.4 pg (ref 26.0–34.0)
MCHC: 31.5 g/dL (ref 30.0–36.0)
MCV: 103 fL — ABNORMAL HIGH (ref 80.0–100.0)
Monocytes Absolute: 1 10*3/uL (ref 0.1–1.0)
Monocytes Relative: 4 %
Neutro Abs: 18.8 10*3/uL — ABNORMAL HIGH (ref 1.7–7.7)
Neutrophils Relative %: 79 %
Platelets: 474 10*3/uL — ABNORMAL HIGH (ref 150–400)
RBC: 4.01 MIL/uL (ref 3.87–5.11)
RDW: 12.4 % (ref 11.5–15.5)
WBC: 23.6 10*3/uL — ABNORMAL HIGH (ref 4.0–10.5)
nRBC: 0 % (ref 0.0–0.2)

## 2022-08-28 LAB — CBC
HCT: 30.7 % — ABNORMAL LOW (ref 36.0–46.0)
Hemoglobin: 9.8 g/dL — ABNORMAL LOW (ref 12.0–15.0)
MCH: 33 pg (ref 26.0–34.0)
MCHC: 31.9 g/dL (ref 30.0–36.0)
MCV: 103.4 fL — ABNORMAL HIGH (ref 80.0–100.0)
Platelets: 186 10*3/uL (ref 150–400)
RBC: 2.97 MIL/uL — ABNORMAL LOW (ref 3.87–5.11)
RDW: 12 % (ref 11.5–15.5)
WBC: 17.3 10*3/uL — ABNORMAL HIGH (ref 4.0–10.5)
nRBC: 0 % (ref 0.0–0.2)

## 2022-08-28 LAB — PHOSPHORUS
Phosphorus: 12.1 mg/dL — ABNORMAL HIGH (ref 2.5–4.6)
Phosphorus: 4 mg/dL (ref 2.5–4.6)
Phosphorus: 1.4 mg/dL — ABNORMAL LOW (ref 2.5–4.6)

## 2022-08-28 LAB — TROPONIN I (HIGH SENSITIVITY)
Troponin I (High Sensitivity): 57 ng/L — ABNORMAL HIGH (ref <18)
Troponin I (High Sensitivity): 265 ng/L (ref <18)

## 2022-08-28 LAB — URINALYSIS, COMPLETE (UACMP) WITH MICROSCOPIC
Bacteria, UA: NONE SEEN
Bilirubin Urine: NEGATIVE
Glucose, UA: >=500 mg/dL — AB
Ketones, ur: 80 mg/dL — AB
Leukocytes,Ua: NEGATIVE
Nitrite: NEGATIVE
Protein, ur: 30 mg/dL — AB
Specific Gravity, Urine: 1.016 (ref 1.005–1.030)
Squamous Epithelial / HPF: NONE SEEN (ref 0–5)
pH: 5 (ref 5.0–8.0)

## 2022-08-28 LAB — HIV ANTIBODY (ROUTINE TESTING W REFLEX): HIV Screen 4th Generation wRfx: NONREACTIVE

## 2022-08-28 LAB — TSH: TSH: 0.589 u[IU]/mL (ref 0.350–4.500)

## 2022-08-28 LAB — CBG MONITORING, ED: Glucose-Capillary: >600 mg/dL (ref 70–99)

## 2022-08-28 LAB — LACTIC ACID, PLASMA
Lactic Acid, Venous: >9 mmol/L (ref 0.5–1.9)
Lactic Acid, Venous: 6.3 mmol/L (ref 0.5–1.9)

## 2022-08-28 LAB — BLOOD GAS, VENOUS
Acid-base deficit: 26.5 mmol/L — ABNORMAL HIGH (ref 0.0–2.0)
Bicarbonate: 3.5 mmol/L — ABNORMAL LOW (ref 20.0–28.0)
O2 Saturation: 74.7 %
Patient temperature: 37
pCO2, Ven: <18 mmHg — CL (ref 44–60)
pH, Ven: 6.98 — CL (ref 7.25–7.43)
pO2, Ven: 54 mmHg — ABNORMAL HIGH (ref 32–45)

## 2022-08-28 LAB — PROTIME-INR
INR: 1.4 — ABNORMAL HIGH (ref 0.8–1.2)
Prothrombin Time: 16.8 seconds — ABNORMAL HIGH (ref 11.4–15.2)

## 2022-08-28 LAB — MAGNESIUM
Magnesium: 2.5 mg/dL — ABNORMAL HIGH (ref 1.7–2.4)
Magnesium: 1.9 mg/dL (ref 1.7–2.4)
Magnesium: 1.5 mg/dL — ABNORMAL LOW (ref 1.7–2.4)

## 2022-08-28 LAB — HCG, QUANTITATIVE, PREGNANCY: hCG, Beta Chain, Quant, S: <1 m[IU]/mL (ref <5)

## 2022-08-28 LAB — ETHANOL: Alcohol, Ethyl (B): <10 mg/dL (ref <10)

## 2022-08-28 LAB — PROCALCITONIN: Procalcitonin: 3.12 ng/mL

## 2022-08-28 LAB — SARS CORONAVIRUS 2 BY RT PCR: SARS Coronavirus 2 by RT PCR: NEGATIVE

## 2022-08-28 LAB — MRSA NEXT GEN BY PCR, NASAL: MRSA by PCR Next Gen: NOT DETECTED

## 2022-08-28 LAB — T4, FREE: Free T4: 0.97 ng/dL (ref 0.61–1.12)

## 2022-08-28 LAB — LIPASE, BLOOD: Lipase: 23 U/L (ref 11–51)

## 2022-08-28 LAB — POC URINE PREG, ED

## 2022-08-28 MED ORDER — SODIUM BICARBONATE 8.4 % IV SOLN
50.0000 meq | Freq: Once | INTRAVENOUS | Status: AC
  Administered 2022-08-28: 50 meq via INTRAVENOUS

## 2022-08-28 MED ORDER — NOREPINEPHRINE 4 MG/250ML-% IV SOLN
INTRAVENOUS | Status: AC
  Filled 2022-08-28: qty 250

## 2022-08-28 MED ORDER — DEXTROSE 50 % IV SOLN
0.0000 mL | INTRAVENOUS | Status: DC | PRN
  Administered 2022-08-29 – 2022-09-01 (×2): 50 mL via INTRAVENOUS
  Filled 2022-08-28 (×2): qty 50

## 2022-08-28 MED ORDER — SODIUM BICARBONATE 8.4 % IV SOLN
50.0000 meq | Freq: Once | INTRAVENOUS | Status: AC
  Administered 2022-08-28: 50 meq via INTRAVENOUS
  Filled 2022-08-28 (×2): qty 50

## 2022-08-28 MED ORDER — MAGNESIUM SULFATE 4 GM/100ML IV SOLN
4.0000 g | Freq: Once | INTRAVENOUS | Status: AC
  Administered 2022-08-28: 4 g via INTRAVENOUS
  Filled 2022-08-28: qty 100

## 2022-08-28 MED ORDER — SODIUM CHLORIDE 0.9 % IV SOLN: 250.0000 mL | INTRAVENOUS | Status: DC

## 2022-08-28 MED ORDER — NOREPINEPHRINE 4 MG/250ML-% IV SOLN
2.0000 ug/min | INTRAVENOUS | Status: DC
  Administered 2022-08-28: 10 ug/min via INTRAVENOUS
  Filled 2022-08-28: qty 250

## 2022-08-28 MED ORDER — POTASSIUM PHOSPHATES 15 MMOLE/5ML IV SOLN
45.0000 mmol | Freq: Once | INTRAVENOUS | Status: AC
  Administered 2022-08-28: 45 mmol via INTRAVENOUS
  Filled 2022-08-28: qty 15

## 2022-08-28 MED ORDER — POLYETHYLENE GLYCOL 3350 17 G PO PACK: 17.0000 g | PACK | Freq: Every day | ORAL | Status: DC | PRN

## 2022-08-28 MED ORDER — LACTATED RINGERS IV BOLUS
1000.0000 mL | Freq: Once | INTRAVENOUS | Status: AC
  Administered 2022-08-28: 1000 mL via INTRAVENOUS

## 2022-08-28 MED ORDER — LACTATED RINGERS IV BOLUS (SEPSIS)
1000.0000 mL | Freq: Once | INTRAVENOUS | Status: AC
  Administered 2022-08-28: 1000 mL via INTRAVENOUS

## 2022-08-28 MED ORDER — INSULIN ASPART 100 UNIT/ML IJ SOLN
0.0000 [IU] | INTRAMUSCULAR | Status: DC
  Administered 2022-08-28: 1 [IU] via SUBCUTANEOUS
  Administered 2022-08-29 (×2): 2 [IU] via SUBCUTANEOUS
  Administered 2022-08-29: 3 [IU] via SUBCUTANEOUS
  Filled 2022-08-28 (×4): qty 1

## 2022-08-28 MED ORDER — DROPERIDOL 2.5 MG/ML IJ SOLN
5.0000 mg | Freq: Once | INTRAMUSCULAR | Status: AC
  Administered 2022-08-28: 5 mg via INTRAVENOUS

## 2022-08-28 MED ORDER — SODIUM BICARBONATE 8.4 % IV SOLN
INTRAVENOUS | Status: AC
  Administered 2022-08-28: 50 meq
  Filled 2022-08-28: qty 100

## 2022-08-28 MED ORDER — DEXMEDETOMIDINE HCL IN NACL 400 MCG/100ML IV SOLN
0.4000 ug/kg/h | INTRAVENOUS | Status: DC
  Administered 2022-08-28: 0.4 ug/kg/h via INTRAVENOUS

## 2022-08-28 MED ORDER — SODIUM BICARBONATE 8.4 % IV SOLN
50.0000 meq | Freq: Once | INTRAVENOUS | Status: AC
  Administered 2022-08-28: 50 meq via INTRAVENOUS
  Filled 2022-08-28: qty 50

## 2022-08-28 MED ORDER — STERILE WATER FOR INJECTION IV SOLN
INTRAVENOUS | Status: DC
  Filled 2022-08-28: qty 150
  Filled 2022-08-28: qty 1000

## 2022-08-28 MED ORDER — CHLORHEXIDINE GLUCONATE CLOTH 2 % EX PADS
6.0000 | MEDICATED_PAD | Freq: Every day | CUTANEOUS | Status: DC
  Administered 2022-08-28 – 2022-09-03 (×6): 6 via TOPICAL

## 2022-08-28 MED ORDER — INSULIN REGULAR(HUMAN) IN NACL 100-0.9 UT/100ML-% IV SOLN
INTRAVENOUS | Status: DC
  Administered 2022-08-28: 4 [IU]/h via INTRAVENOUS
  Administered 2022-08-28: 10.5 [IU]/h via INTRAVENOUS
  Filled 2022-08-28 (×2): qty 100

## 2022-08-28 MED ORDER — INSULIN DETEMIR 100 UNIT/ML ~~LOC~~ SOLN
10.0000 [IU] | Freq: Two times a day (BID) | SUBCUTANEOUS | Status: DC
  Administered 2022-08-28 – 2022-08-29 (×2): 10 [IU] via SUBCUTANEOUS
  Filled 2022-08-28 (×5): qty 0.1

## 2022-08-28 MED ORDER — DEXMEDETOMIDINE HCL IN NACL 400 MCG/100ML IV SOLN
INTRAVENOUS | Status: AC
  Filled 2022-08-28: qty 100

## 2022-08-28 MED ORDER — DOCUSATE SODIUM 100 MG PO CAPS: 100.0000 mg | ORAL_CAPSULE | Freq: Two times a day (BID) | ORAL | Status: DC | PRN

## 2022-08-28 MED ORDER — DEXTROSE IN LACTATED RINGERS 5 % IV SOLN: INTRAVENOUS | Status: DC

## 2022-08-28 MED ORDER — LORAZEPAM 2 MG/ML IJ SOLN
2.0000 mg | Freq: Once | INTRAMUSCULAR | Status: AC
Start: 2022-08-28 — End: 2022-08-28
  Administered 2022-08-28: 2 mg via INTRAVENOUS
  Filled 2022-08-28: qty 1

## 2022-08-28 MED ORDER — LACTATED RINGERS IV SOLN: INTRAVENOUS | Status: DC

## 2022-08-28 NOTE — ED Notes (Signed)
Per endo tool, continue insulin at 10.5 units/hr

## 2022-08-28 NOTE — Progress Notes (Signed)
Received report at bedside from [facility]. Vitals unstable with BP not adequate on Levo at 10/hr. MD Mortimer Fries and NP Dewaine Conger to bedside. New orders placed for sodium bicarb pushes, sodium bicarb gtt and LR boluses. Pushes administered with improvement in BP. Patient alert and responds to voice. Able to tell me where she is, year 2023, her name and reports no pain. Mother called and transferred to this RN for update after receiving update from MD [facility]. Updated mother that vitals are stable at this time but decline is always possible. Will continue to monitor closely. Patient sleeping in bed at this time with all needs met.

## 2022-08-28 NOTE — Procedures (Signed)
Arterial Catheter Insertion Procedure Note  AYMAR WHITFILL  [id number]  09-14-1996  Date:08/28/22  Time:6:46 AM    Provider Performing: Karen Kays    Procedure: Insertion of Arterial Line [phone]) with US guidance ([id number])   Indication(s) Blood pressure monitoring and/or need for frequent ABGs  Consent Unable to obtain consent due to emergent nature of procedure.  Anesthesia None  Time Out Verified patient identification, verified procedure, site/side was marked, verified correct patient position, special equipment/implants available, medications/allergies/relevant history reviewed, required imaging and test results available.  Sterile Technique Maximal sterile technique including full sterile barrier drape, hand hygiene, sterile gown, sterile gloves, mask, hair covering, sterile ultrasound probe cover (if used).  Procedure Description Area of catheter insertion was cleaned with chlorhexidine and draped in sterile fashion. With real-time ultrasound guidance an arterial catheter was placed into the left femoral artery.  Appropriate arterial tracings confirmed on monitor.    Complications/Tolerance None; patient tolerated the procedure well.  EBL Minimal  Specimen(s) None   Rufina Falco, DNP, CCRN, FNP-C, AGACNP-BC Acute Care & Family Nurse Practitioner  [facility]  See Amion for personal pager PCCM on call pager [phone] until 7 am

## 2022-08-28 NOTE — Progress Notes (Signed)
IV watch placed on left FA US guided IV with Levophed infusing.

## 2022-08-28 NOTE — Inpatient Diabetes Management (Signed)
Inpatient Diabetes Program Recommendations  AACE/ADA: New Consensus Statement on Inpatient Glycemic Control (2015)  Target Ranges:  Prepandial:   less than 140 mg/dL      Peak postprandial:   less than 180 mg/dL (1-2 hours)      Critically ill patients:  140 - 180 mg/dL   Lab Results  Component Value Date   GLUCAP 125 (H) 08/28/2022   HGBA1C 7.1 (H) 11/05/2019    Review of Glycemic Control  Latest Reference Range & Units 08/28/22 14:05  Glucose-Capillary 70 - 99 mg/dL 125 (H)  (H): Data is abnormally high  Latest Reference Range & Units 08/28/22 14:05  Glucose-Capillary 70 - 99 mg/dL 125 (H)  (H): Data is abnormally high  Latest Reference Range & Units 08/28/22 14:04  Beta-Hydroxybutyric Acid 0.05 - 0.27 mmol/L 0.90 (H)  (H): Data is abnormally high   Current orders for Inpatient glycemic control: IV insulin  Inpatient Diabetes Program Recommendations:    When criteria is met and MD is ready to transition to SQ insulin, please consider:  Levemir 10 units BID Novolog 0-9 units Q4H  Will continue to follow while inpatient.  Thank you, Reche Dixon, MSN, [place] [facility] Coordinator Inpatient Diabetes Program [phone] (team pager from 8a-5p)

## 2022-08-28 NOTE — ED Provider Notes (Signed)
[facility] Provider Note    Event Date/Time   First MD Initiated Contact with Patient 08/28/22 0147     (approximate)   History   Altered Mental Status and Hyperglycemia   HPI  Level 5 caveat:  history/ROS limited by acute/critical illness  [name] is a 26 y.o. female who reportedly has a history of type 1 diabetes with a history of prior DKA episodes, as well as reportedly having alcohol dependency according to her brother who called EMS.  She presents by EMS for evaluation of altered mental status.  The patient's family says that the patient has not felt well since yesterday but also had some alcohol yesterday.  When her brother checked on her tonight, she was not responding normally.  Her blood glucose reads as high.  No additional information is available, but the patient is altered, confused, and combative.  She has tachypnea and tachycardia and is presenting critically ill.     Physical Exam   Triage Vital Signs: ED Triage Vitals  Enc Vitals Group     BP 08/28/22 0150 (!) 144/87     Pulse Rate 08/28/22 0150 (!) 157     Resp 08/28/22 0150 (!) 46     Temp 08/28/22 0153 98.8 F (37.1 C)     Temp Source 08/28/22 0153 Oral     SpO2 08/28/22 0150 96 %     Weight --      Height --      Head Circumference --      Peak Flow --      Pain Score --      Pain Loc --      Pain Edu? --      Excl. in GC? --     Most recent vital signs: Vitals:   08/28/22 0800 08/28/22 0830  BP:    Pulse: (!) 106 (!) 107  Resp: (!) 30 18  Temp: 98.6 F (37 C)   SpO2: 100% 99%     General: Awake, looking around the room but clearly not processing what she is saying. CV:  Good peripheral perfusion.  Tachycardia in the 150s.  Regular rhythm. Resp:  Increased respiratory rate in the mid-40s.  Lungs are clear to auscultation. Abd:  No distention.  No tenderness to palpation. Other:  Altered mental status, combative, not redirectable, not understanding what is  happening.   ED Results / Procedures / Treatments   Labs (all labs ordered are listed, but only abnormal results are displayed) Labs Reviewed  LACTIC ACID, PLASMA - Abnormal; Notable for the following components:      Result Value   Lactic Acid, Venous >9.0 (*)    All other components within normal limits  COMPREHENSIVE METABOLIC PANEL - Abnormal; Notable for the following components:   Potassium 5.9 (*)    Chloride 95 (*)    CO2 <7 (*)    Glucose, Bld 858 (*)    BUN 34 (*)    Creatinine, Ser 2.21 (*)    Calcium 8.8 (*)    AST 52 (*)    Total Bilirubin 2.1 (*)    GFR, Estimated 30 (*)    All other components within normal limits  CBC WITH DIFFERENTIAL/PLATELET - Abnormal; Notable for the following components:   WBC 23.6 (*)    MCV 103.0 (*)    Platelets 474 (*)    Neutro Abs 18.8 (*)    Abs Immature Granulocytes 0.60 (*)    All other  components within normal limits  PROTIME-INR - Abnormal; Notable for the following components:   Prothrombin Time 16.8 (*)    INR 1.4 (*)    All other components within normal limits  URINALYSIS, COMPLETE (UACMP) WITH MICROSCOPIC - Abnormal; Notable for the following components:   Color, Urine STRAW (*)    APPearance CLEAR (*)    Glucose, UA >=500 (*)    Hgb urine dipstick MODERATE (*)    Ketones, ur 80 (*)    Protein, ur 30 (*)    All other components within normal limits  BLOOD GAS, VENOUS - Abnormal; Notable for the following components:   pH, Ven 6.98 (*)    pCO2, Ven <18 (*)    pO2, Ven 54 (*)    Bicarbonate 3.5 (*)    Acid-base deficit 26.5 (*)    All other components within normal limits  BETA-HYDROXYBUTYRIC ACID - Abnormal; Notable for the following components:   Beta-Hydroxybutyric Acid >8.00 (*)    All other components within normal limits  MAGNESIUM - Abnormal; Notable for the following components:   Magnesium 2.5 (*)    All other components within normal limits  PHOSPHORUS - Abnormal; Notable for the following  components:   Phosphorus 12.1 (*)    All other components within normal limits  URINE DRUG SCREEN, QUALITATIVE ([facility] ONLY) - Abnormal; Notable for the following components:   Cannabinoid 50 Ng, Ur [place] POSITIVE (*)    All other components within normal limits  BASIC METABOLIC PANEL - Abnormal; Notable for the following components:   CO2 13 (*)    Glucose, Bld 461 (*)    BUN 33 (*)    Creatinine, Ser 2.31 (*)    Calcium 8.0 (*)    GFR, Estimated 29 (*)    Anion gap 26 (*)    All other components within normal limits  BETA-HYDROXYBUTYRIC ACID - Abnormal; Notable for the following components:   Beta-Hydroxybutyric Acid >8.00 (*)    All other components within normal limits  CBC - Abnormal; Notable for the following components:   WBC 17.3 (*)    RBC 2.97 (*)    Hemoglobin 9.8 (*)    HCT 30.7 (*)    MCV 103.4 (*)    All other components within normal limits  GLUCOSE, CAPILLARY - Abnormal; Notable for the following components:   Glucose-Capillary 596 (*)    All other components within normal limits  GLUCOSE, CAPILLARY - Abnormal; Notable for the following components:   Glucose-Capillary 522 (*)    All other components within normal limits  GLUCOSE, CAPILLARY - Abnormal; Notable for the following components:   Glucose-Capillary 451 (*)    All other components within normal limits  GLUCOSE, CAPILLARY - Abnormal; Notable for the following components:   Glucose-Capillary 504 (*)    All other components within normal limits  GLUCOSE, CAPILLARY - Abnormal; Notable for the following components:   Glucose-Capillary 472 (*)    All other components within normal limits  GLUCOSE, CAPILLARY - Abnormal; Notable for the following components:   Glucose-Capillary 369 (*)    All other components within normal limits  CBG MONITORING, ED - Abnormal; Notable for the following components:   Glucose-Capillary >600 (*)    All other components within normal limits  TROPONIN I (HIGH SENSITIVITY) -  Abnormal; Notable for the following components:   Troponin I (High Sensitivity) 57 (*)    All other components within normal limits  SARS CORONAVIRUS 2 BY RT PCR  CULTURE, BLOOD (  SINGLE) W REFLEX TO ID PANEL  MRSA NEXT GEN BY PCR, NASAL  URINE CULTURE  CULTURE, BLOOD (ROUTINE X 2)  CULTURE, BLOOD (SINGLE)  LIPASE, BLOOD  ETHANOL  HCG, QUANTITATIVE, PREGNANCY  MAGNESIUM  PHOSPHORUS  TSH  T4, FREE  PROCALCITONIN  HIV ANTIBODY (ROUTINE TESTING W REFLEX)  LACTIC ACID, PLASMA  POC URINE PREG, ED  CBG MONITORING, ED  TROPONIN I (HIGH SENSITIVITY)     EKG  ED ECG REPORT I, Loleta Rose, the attending physician, personally viewed and interpreted this ECG.  Date: 08/28/2022 EKG Time: 2:06 AM Rate: 144 Rhythm: Sinus tachycardia QRS Axis: normal Intervals: normal, QTc 446 ms ST/T Wave abnormalities: Non-specific ST segment / T-wave changes, but no clear evidence of acute ischemia.  Likely rate related Narrative Interpretation: no definitive evidence of acute ischemia; does not meet STEMI criteria.    RADIOLOGY I viewed and interpreted the patient's 1 view chest x-ray and there is no evidence of pneumonia.  I also read the radiologist's report, which confirmed no acute findings.    PROCEDURES:  Critical Care performed: Yes, see critical care procedure note(s)  .1-3 Lead EKG Interpretation  Performed by: Loleta Rose, MD Authorized by: Loleta Rose, MD     Interpretation: abnormal     ECG rate:  155   ECG rate assessment: tachycardic     Rhythm: sinus tachycardia     Ectopy: none     Conduction: normal   .Critical Care  Performed by: Loleta Rose, MD Authorized by: Loleta Rose, MD   Critical care provider statement:    Critical care time (minutes):  60   Critical care time was exclusive of:  Separately billable procedures and treating other patients   Critical care was necessary to treat or prevent imminent or life-threatening deterioration of the following  conditions:  Metabolic crisis and CNS failure or compromise   Critical care was time spent personally by me on the following activities:  Development of treatment plan with patient or surrogate, evaluation of patient's response to treatment, examination of patient, obtaining history from patient or surrogate, ordering and performing treatments and interventions, ordering and review of laboratory studies, ordering and review of radiographic studies, pulse oximetry, re-evaluation of patient's condition and review of old charts    MEDICATIONS ORDERED IN ED: Medications  insulin regular, human (MYXREDLIN) 100 units/ 100 mL infusion (6.5 Units/hr Intravenous Infusion Verify 08/28/22 0844)  lactated ringers infusion (has no administration in time range)  dextrose 5 % in lactated ringers infusion (has no administration in time range)  dextrose 50 % solution 0-50 mL (has no administration in time range)  docusate sodium (COLACE) capsule 100 mg (has no administration in time range)  polyethylene glycol (MIRALAX / GLYCOLAX) packet 17 g (has no administration in time range)  dexmedetomidine (PRECEDEX) 400 MCG/100ML (4 mcg/mL) infusion (0 mcg/kg/hr  72 kg Intravenous Stopped 08/28/22 0431)  dexmedetomidine (PRECEDEX) 400 MCG/100ML (4 mcg/mL) infusion (  Not Given 08/28/22 0432)  norepinephrine (LEVOPHED) 4-5 MG/250ML-% infusion SOLN (  Not Given 08/28/22 0809)  0.9 %  sodium chloride infusion (has no administration in time range)  norepinephrine (LEVOPHED) 4mg  in (0.016 mg/mL) premix infusion (10 mcg/min Intravenous Infusion Verify 08/28/22 0844)  lactated ringers bolus 1,000 mL (has no administration in time range)  sodium bicarbonate 150 mEq in sterile water 1,150 mL infusion ( Intravenous Infusion Verify 08/28/22 0844)  LORazepam (ATIVAN) injection 2 mg (2 mg Intravenous Given 08/28/22 0154)  lactated ringers bolus 1,000 mL (0 mLs  Intravenous Stopped 08/28/22 0255)  droperidol (INAPSINE) 2.5  MG/ML injection 5 mg (5 mg Intravenous Given 08/28/22 0216)  lactated ringers bolus 1,000 mL (1,000 mLs Intravenous New Bag/Given 08/28/22 0238)  sodium bicarbonate injection 50 mEq (50 mEq Intravenous Given 08/28/22 0238)  sodium bicarbonate injection 50 mEq (50 mEq Intravenous Given 08/28/22 0305)  lactated ringers bolus 1,000 mL (1,000 mLs Intravenous New Bag/Given 08/28/22 0340)  lactated ringers bolus 1,000 mL (0 mLs Intravenous Stopped 08/28/22 0825)  lactated ringers bolus 1,000 mL ( Intravenous Stopped 08/28/22 0824)  sodium bicarbonate injection 50 mEq (50 mEq Intravenous Given 08/28/22 0725)  sodium bicarbonate injection 50 mEq (50 mEq Intravenous Given 08/28/22 0725)  sodium bicarbonate injection 50 mEq (50 mEq Intravenous Given 08/28/22 0719)  sodium bicarbonate injection 50 mEq (50 mEq Intravenous Given 08/28/22 0719)  sodium bicarbonate 1 mEq/mL injection (50 mEq  Given 08/28/22 0718)     IMPRESSION / MDM / ASSESSMENT AND PLAN / ED COURSE  I reviewed the triage vital signs and the nursing notes.                              Differential diagnosis includes, but is not limited to, DKA, HHS, sepsis, medication/drug side effect or intoxication, alcohol withdrawal.  Patient's presentation is most consistent with acute presentation with potential threat to life or bodily function.  Patient is presenting most consistent with DKA.  Vital signs are concerning and consistent with DKA.  She is also altered.  Alcohol intoxication or withdrawal is also possible but the history is limited.  I ordered Ativan 2 mg IV to try and help calm her down so we can assess her.  I am reluctant to use an antipsychotic such as droperidol or haloperidol given that I do not know what her electrolytes may be and I want to see an EKG before I order medications that may prolong her QTc.  She also received Zofran 4 mg IV by EMS prior to arrival to the hospital as well as a small fluid bolus.  I ordered LR 1 L  IV bolus to begin fluid resuscitation.  Her CBG for EMS was reported as "high".  I ordered standard sepsis labs including the following: COVID-19 PCR swab, blood cultures x1, pro time-INR, CMP, urinalysis, urine culture, lactic acid, APTT, CBC with differential, lipase, beta hydroxybutyric acid, hCG quantitative pregnancy test, ethanol level, magnesium level, phosphorus level.  I also ordered an in and out catheterization when the patient is able to tolerated along with a urine drug screen given her altered mental status (an attempt to rule out other causes of altered mental status).  The patient is on the cardiac monitor to evaluate for evidence of arrhythmia and/or significant heart rate changes.   Clinical Course as of 08/28/22 0856  Mon Aug 28, 2022  0234 Blood gas, venous(!!) [CF]  0234 I interpreted the VBG, it is very concerning for profound acidemia with the VBG pH is 6.98 and a PCO2 of less than 18 and a bicarb of 3.5.  I ordered another liter of LR IV bolus and 1 ampoule of sodium bicarbonate.  I reassessed the patient and she is still critically ill but is less combative at this time (after verifying that her QTc interval was normal, I ordered droperidol 5 mg IV in addition to the prior Ativan 2 mg IV).  I very much would like to avoid intubating her given the difficulty maintaining her respiratory  rate and effort while on the ventilator as well as the possibility that the intubation process/RSI will cause her to be more acidemic from the potential rise in PaCO2; I suspect that she can achieve greater hyperventilation on her own and then she would under mechanical ventilation. [CF]  0236 I need to await the results of her metabolic panel before starting IV insulin.  Other notable labs include a leukocytosis of 23.6 which is likely situational/stress-reaction and not reflective of sepsis. [CF]  0239 I called and spoke directly with the lab given the urgency of knowing the patient's  electrolytes.  They were able to give me the preliminary results that her potassium is 5.9 and her glucose is greater than 850. This allows me to begin treatment with IV insulin per DKA protocol.  I have ordered that according to the DKA order set. [CF]  0244 Consulting ICU for admission [CF]  0245 Lab called and verified creatinine of 2.21 [CF]  0248 I consulted the ICU Blair Hailey) and we discussed the case in detail.  She will admit the patient and agrees with the management thus far.  I reported the lab values as reported verbally to me. [CF]  0251 DG Chest [facility] I viewed and interpreted the patient's 1 view chest x-ray and I see no evidence of acute infection.  I viewed and interpreted the patient's [CF]    Clinical Course User Index [CF] Loleta Rose, MD     FINAL CLINICAL IMPRESSION(S) / ED DIAGNOSES   Final diagnoses:  Type 1 diabetes mellitus with ketoacidotic coma (HCC)  Lactic acidosis  Acute renal failure, unspecified acute renal failure type (HCC)  Hyperkalemia  Altered mental status, unspecified altered mental status type  Leukocytosis, unspecified type     Rx / DC Orders   ED Discharge Orders     None        Note:  This document was prepared using Dragon voice recognition software and may include unintentional dictation errors.   Loleta Rose, MD 08/28/22 [phone]

## 2022-08-28 NOTE — ED Notes (Signed)
Report to Kiln, rn.

## 2022-08-28 NOTE — Progress Notes (Addendum)
Insulin stopped at 1406 per [employer] recommendation. CBG at 1406 = 125. D5 LR hung at 1423. 1400 lab for BMP and Beta sent down to Lab by this RN. Drawn off of A-line. A-line flushed and repositioned again. Patient remains restless in bed, moving frequently and aline can be positional. Cuff and A-line are correlating when patient is less restless. RN Roselyn Reef to bedside to assist in A-line zeroing and positioning.

## 2022-08-28 NOTE — Progress Notes (Signed)
2000 patient alert and orientated able to make needs known deceased levo per orders then turned off BP on art line WNL, family at bedside obtained labs 2130 Insulin off and Dextrose per orders replacing electrolytes based on labs 08/29/2021 0430 artline and foley removed no complications pt tolerated well

## 2022-08-28 NOTE — Progress Notes (Addendum)
An USGPIV (ultrasound guided PIV) has been placed for short-term vasopressor infusion. A correctly placed ivWatch must be used when administering Vasopressors. Should this treatment be needed beyond 72 hours, central line access should be obtained.  It will be the responsibility of the bedside nurse to follow best practice to prevent extravasations.   ?

## 2022-08-28 NOTE — H&P (Addendum)
NAME:  [name], MRN:  [id number], DOB:  22-Mar-1996, LOS: 0 ADMISSION DATE:  08/28/2022, CONSULTATION DATE:  08/28/22 REFERRING MD:  Loleta Rose  CHIEF COMPLAINT:  Severe hyperglycemia   HPI  26 y.o with significant PMH of T1DM, DKA, HTN, and generalized anxiety disorder who presented to the ED with chief complaints of altered mental status.  Per EMS report, patient's family had been trying to reach her since yesterday and when they went to check on her, they found her altered. Last seen normal was yesterday per family members. On EMS arrival, patient was noted to have elevated blood glucose readings and very altered.   ED Course: Initial vital signs showed HR of 157 beats/minute, BP 144/87 mm Hg, the RR 46 breaths/minute, and the oxygen saturation 96  % on RA and a temperature of 98.48F (37.1C). Patient was lethargic, moaned intermittently, and responded with one-word answers; symmetric movement in the arms and legs was observed.  She was apparently agitated requiring sedation with Droperidol and Lorazepam.  Pertinent Labs/Diagnostics Findings: Chemistry:Na+/ K+: 136/5.9 Glucose: 858  BUN/Cr.: 34/2.21   AST/ALT52/32: CBC: WBC: 23.6 Other Lab findings:   PCT: pending, Lactic acid: >9.0,  CO2:<7, Anion Gap: unable to calculate Venous Blood Gas result:  pO2 54; pCO2 <18; pH 6.98;  HCO3 3.5, %O2 Sat 74.7.  Imaging:  CXR> No active cardiopulmonary process  The management was initiated with initial intravenous fluid replacement, and intravenous (IV) bicarbonate push followed by intravenous insulin infusion as per DKA protocol. PCCM consulted to admit for further management of  severe DKA.  Past Medical History    Diabetes mellitus without complication (HCC)     DKA (diabetic ketoacidoses) 02/25/2017   Generalized anxiety disorder 01/31/2018   Heart murmur      at birth   Hypertension    Significant Hospital Events   10/30: Admit to the ICU with severe DKA  Consults:  Diabetes  Co-ordinator  Procedures:  None  Significant Diagnostic Tests:  08/28/22: Chest Xray>No active cardiopulmonary process  Micro Data:  10/30: SARS-CoV-2 PCR> negative 10/30: Influenza PCR> negative 10/30: Blood culture x2> 10/30: Urine Culture> 10/30: MRSA PCR>>   Antimicrobials:  None  OBJECTIVE  Blood pressure 107/61, pulse 93, temperature 98.8 F (37.1 C), temperature source Oral, resp. rate (!) 25, weight 72 kg, SpO2 (!) 86 %.        Intake/Output Summary (Last 24 hours) at 08/28/2022 0325 Last data filed at 08/28/2022 0255 Gross per 24 hour  Intake 1000 ml  Output --  Net 1000 ml   Filed Weights   08/28/22 0240  Weight: 72 kg   Physical Examination  GENERAL: 26 year-old critically ill patient lying in the bed in acute distress.  EYES: Pupils equal, round, reactive to light and accommodation. No scleral icterus. Extraocular muscles intact.  HEENT: Head atraumatic, normocephalic. Oropharynx and nasopharynx clear.  NECK:  Supple, no jugular venous distention. No thyroid enlargement, no tenderness.  LUNGS: Decreased  breath sounds bilaterally, no wheezing, rales,rhonchi or crepitation. Moderate use of accessory muscles of respiration. Kussmal type breathing CARDIOVASCULAR: S1, S2 normal. Murmurs present, rubs, or gallops.  ABDOMEN: Soft, nontender, nondistended. Bowel sounds present. No organomegaly or mass.  EXTREMITIES: Upper and lower extremities are atraumatic in appearance without tenderness or deformity. No swelling or erythema. Capillary refill > 3 seconds in all extremities. Pulses palpable.  NEUROLOGIC:The patient is awake, but disoriented x 3. Moves all extremities spontaneously. Sensation is intact bilaterally. Reflexes 2+ bilaterally. Cranial nerves  are intact.  Gait not checked.  PSYCHIATRIC: The patient is altered unable to assess SKIN: No obvious rash, lesion, or ulcer.   Labs/imaging that I havepersonally reviewed  (right click and "Reselect all  SmartList Selections" daily)     Labs   CBC: Recent Labs  Lab 08/28/22 0156  WBC 23.6*  NEUTROABS 18.8*  HGB 13.0  HCT 41.3  MCV 103.0*  PLT 474*    Basic Metabolic Panel: Recent Labs  Lab 08/28/22 0156  NA 136  K 5.9*  CL 95*  CO2 <7*  GLUCOSE 858*  BUN 34*  CREATININE 2.21*  CALCIUM 8.8*  MG 2.5*  PHOS 12.1*   GFR: Estimated Creatinine Clearance: 38.5 mL/min (A) (by C-G formula based on SCr of 2.21 mg/dL (H)). Recent Labs  Lab 08/28/22 0156 08/28/22 0212  WBC 23.6*  --   LATICACIDVEN  --  >9.0*    Liver Function Tests: Recent Labs  Lab 08/28/22 0156  AST 52*  ALT 32  ALKPHOS 98  BILITOT 2.1*  PROT 7.6  ALBUMIN 4.1   Recent Labs  Lab 08/28/22 0156  LIPASE 23   No results for input(s): "AMMONIA" in the last 168 hours.  ABG    Component Value Date/Time   PHART 6.92 (LL) 02/25/2017 0814   PCO2ART 19 (LL) 02/25/2017 0814   PO2ART 140 (H) 02/25/2017 0814   HCO3 3.5 (L) 08/28/2022 0212   ACIDBASEDEF 26.5 (H) 08/28/2022 0212   O2SAT 74.7 08/28/2022 0212     Coagulation Profile: Recent Labs  Lab 08/28/22 0156  INR 1.4*    Cardiac Enzymes: No results for input(s): "CKTOTAL", "CKMB", "CKMBINDEX", "TROPONINI" in the last 168 hours.  HbA1C: Hemoglobin A1C  Date/Time Value Ref Range Status  08/26/2013 03:33 AM SEE COMMENT 4.2 - 6.3 % Final    Comment:    The American Diabetes Association recommends that a primary goal of therapy should be <7% and that physicians should reevaluate the treatment regimen in patients with HbA1c values consistently >8%. HEMOGLOBIN A1C - H -- 17.3 %  - Reference Range: 4.8 - 5.6  - .  - Increased risk for diabetes: 5.7-6.4  - Diabetes: >6.4  - Glycemic control for adults w/diabetes: <7.0  - .  - PERFORMED BY [facility] SPEC.NO.: [id number]  - [address],[place] [address]  - Lindon Romp, MD  - [phone]    Hgb A1c MFr Bld  Date/Time Value Ref Range Status   11/05/2019 05:47 PM 7.1 (H) 4.8 - 5.6 % Final    Comment:    (NOTE) Pre diabetes:          5.7%-6.4% Diabetes:              >6.4% Glycemic control for   <7.0% adults with diabetes   02/26/2017 06:11 PM 8.1 (H) 4.8 - 5.6 % Final    Comment:    (NOTE)         Pre-diabetes: 5.7 - 6.4         Diabetes: >6.4         Glycemic control for adults with diabetes: <7.0     CBG: Recent Labs  Lab 08/28/22 0318  GLUCAP >600*    Review of Systems:   UNABLE TO OBTAIN DUE TO ALTERED MENTAL STATUS  Past Medical History  She,  has a past medical history of Diabetes mellitus without complication ([place]), DKA (diabetic ketoacidoses) (02/25/2017), Generalized anxiety disorder (01/31/2018), Heart murmur, and Hypertension.   Surgical History  Past Surgical History:  Procedure Laterality Date   NO PAST SURGERIES       Social History   reports that she has never smoked. She has never used smokeless tobacco. She reports that she does not currently use alcohol. She reports that she does not use drugs.   Family History   Her family history includes Diabetes in her father; Renal cancer in her father. There is no history of Cancer.   Allergies Allergies  Allergen Reactions   Sulfamethoxazole-Trimethoprim Hives   Pravastatin Other (See Comments)     Home Medications  Prior to Admission medications   Medication Sig Start Date End Date Taking? Authorizing Provider  Continuous Blood Gluc Receiver (FREESTYLE LIBRE 2 READER) DEVI Use to monitor blood glucose. 08/08/19   [provider]  insulin aspart (NOVOLOG) 100 UNIT/ML injection Inject 2 Units into the skin 3 (three) times daily with meals. 11/08/19   Lynn Ito, MD  insulin detemir (LEVEMIR) 100 UNIT/ML injection Inject 0.12 mLs (12 Units total) into the skin 2 (two) times daily. 11/08/19   Lynn Ito, MD  levonorgestrel (MIRENA) 20 MCG/24HR IUD 1 each by Intrauterine route once.    [provider]  nitrofurantoin,  macrocrystal-monohydrate, (MACROBID) 100 MG capsule Take 1 capsule (100 mg total) by mouth 2 (two) times daily. 06/13/21   Candis Schatz, PA-C  pantoprazole (PROTONIX) 40 MG tablet Take 1 tablet (40 mg total) by mouth daily. 11/09/19   Lynn Ito, MD  phenazopyridine (PYRIDIUM) 200 MG tablet Take 1 tablet (200 mg total) by mouth 3 (three) times daily. 04/24/22   Becky Augusta, NP    Scheduled Meds: Continuous Infusions:  dexmedetomidine (PRECEDEX) IV infusion 0.4 mcg/kg/hr (08/28/22 0310)   dexmedetomidine     dextrose 5% lactated ringers     insulin 10.5 Units/hr (08/28/22 0250)   lactated ringers 1,000 mL (08/28/22 0340)   lactated ringers     PRN Meds:.dexmedetomidine, dextrose, docusate sodium, polyethylene glycol  Active Hospital Problem list     Assessment & Plan:  Diabetic Ketoacidosis suspect due to noncompliance with home regimen PMHx: DM type 1  -CXR, UA negative, Blood  and urine Cultures for infection pending -EKG  shows no ischemia, check Troponin, TSH, Free T4 -Will check Lipase for pancreatitis -Received Insulin (regular) 0.1u/kg (~10 units) IV x1. Continue Insulin drip, DKA protocol -Keep NPO -Glucose: q1h to titrate insulin -Lab monitoring: q4h BMP+Phosphorus+pH (ABG/VBG) to assess severity of acidemia -Aggressive  volume repletion  -Goal to normalize anion gap  -When normalize anion gap and low stable insulin requirement for 4 hours will start  long acting insulin then stop drip 2-3 hours AFTER to bridge effect and start carb-controlled diet. -Diabetes coordinator consult   Acute Kidney Injury likely ATN in the setting of severe DKA Severe Anion Gap Metabolic Acidosis with Lactic Acidosis Hyperkalemia correcting with Insulin -Monitor I&O's / urinary output -Follow BMP -Ensure adequate renal perfusion -Avoid nephrotoxic agents as able -Replace electrolytes as indicated  Leukocytosis likely reactive in the setting of above Meets SIRS Criteria  -Monitor  fever curve -Trend WBC's & Procalcitonin -Follow cultures a -Hold empiric pending cultures & sensitivities  Toxic Metabolic Encephalopathy in the setting of above Very agitated in the ED requiring Droperidol and Lorazepam -UDS +Marijuana -Provide supportive care -Start Precedex gtt wean as tolerated -CTH if no improvement   Best practice:  Diet:  NPO Pain/Anxiety/Delirium protocol (if indicated): No VAP protocol (if indicated): Not indicated DVT prophylaxis: SCD GI prophylaxis: PPI Glucose control:  Insulin gtt Central venous access:  N/A Arterial line:  N/A Foley:  Yes, and it is still needed Mobility:  bed rest  PT consulted: N/A Last date of multidisciplinary goals of care discussion [08/28/22] Code Status:  full code Disposition: ICU   = Goals of Care = Code Status Order: FULL  Primary Emergency Contact: Eichhorst,Nina, Home Phone: [phone] Wishes to pursue full aggressive treatment and intervention options, including CPR and intubation, but goals of care will be addressed on going with family if that should become necessary.   Critical care time: 45 minutes       Webb Silversmith, DNP, CCRN, FNP-C, AGACNP-BC Acute Care Nurse Practitioner [facility]  PCCM on call pager [phone] until 7 am

## 2022-08-28 NOTE — Progress Notes (Signed)
Critical troponin called to this RN by lab resulting at 265. MD Kasa updated by this RN regarding new critical troponin results.

## 2022-08-28 NOTE — Inpatient Diabetes Management (Signed)
Inpatient Diabetes Program Recommendations  AACE/ADA: New Consensus Statement on Inpatient Glycemic Control (2015)  Target Ranges:  Prepandial:   less than 140 mg/dL      Peak postprandial:   less than 180 mg/dL (1-2 hours)      Critically ill patients:  140 - 180 mg/dL   Lab Results  Component Value Date   GLUCAP 369 (H) 08/28/2022   HGBA1C 7.1 (H) 11/05/2019    Review of Glycemic Control  Latest Reference Range & Units 08/28/22 01:56  CO2 22 - 32 mmol/L <7 (L)  Glucose 70 - 99 mg/dL 858 (HH)  Anion gap 5 - 15  NOT CALCULATED  (HH): Data is critically high  Latest Reference Range & Units 08/28/22 01:56  Beta-Hydroxybutyric Acid 0.05 - 0.27 mmol/L >8.00 (H)  (H): Data is abnormally high (L): Data is abnormally low  Diabetes history:  DM1 Outpatient Diabetes medications:  Levemir 32 units BID Insulin Carb ratio--1 units:3 carbs Insulin correction factor--1 unit for every 50 > 150 A1C 9.6% on 08/10/22  Endo-Dr. Honor Junes Current orders for Inpatient glycemic control:  IV insulin  Severe DKA Type 1 DM  Last visit with Dr. Honor Junes was on 08/10/22.  She was taking the above home medications.  He prescribed her the Omnipod insulin pump on that date.  Does not appear she started that as there are no notes from his office for pump training.  Also see that a prescription was denied on 10/13 which may have been the insulin pump.    BHB is still >8 @ 05:44 this am.    Will continue to follow while inpatient.  Thank you, Reche Dixon, MSN, [place] Diabetes Coordinator Inpatient Diabetes Program [phone] (team pager from 8a-5p)

## 2022-08-28 NOTE — ED Triage Notes (Signed)
Pt arrives with ams, fighting staff, blood sugar "high". Pt known insulin dependent diabetic, per ems pt's family states has not felt well since yesterday.

## 2022-08-28 NOTE — Consult Note (Signed)
[place] for Electrolyte Monitoring and Replacement   Recent Labs: Potassium (mmol/L)  Date Value  08/28/2022 3.8  03/18/2014 3.9   Magnesium (mg/dL)  Date Value  08/28/2022 1.9  08/26/2013 1.2 (L)   Calcium (mg/dL)  Date Value  08/28/2022 8.0 (L)   Calcium, Total (mg/dL)  Date Value  03/18/2014 8.7 (L)   Albumin (g/dL)  Date Value  08/28/2022 4.1  06/19/2018 3.3 (L)   Phosphorus (mg/dL)  Date Value  08/28/2022 4.0   Sodium (mmol/L)  Date Value  08/28/2022 145  07/03/2018 142  03/18/2014 134 (L)   Assessment: Patient is a 26 y/o F with medical history including T1DM, DKA, HTN, GAD who is admitted with severe DKA. Pharmacy consulted to assist with electrolyte monitoring and replacement as indicated.  AKI resolving with fluid resuscitation  DKA on insulin gtt MIVF: Bicarb gtt at 100 cc/hr  Goal of Therapy:  Electrolytes within normal limits  Plan:  --Hyperkalemia has resolved with insulin therapy and resolution of acidosis --No electrolyte replacement indicated at this time --Continue to follow BMPs q4h while on insulin gtt  Benita Gutter 08/28/2022 10:59 AM

## 2022-08-28 NOTE — IPAL (Signed)
  Interdisciplinary Goals of Care Family Meeting   Date carried out: 08/28/2022  Location of the meeting: Phone conference  Member's involved: Physician and Family Member or next of kin    GOALS OF CARE DISCUSSION  The Clinical status was relayed to family in [place]  Updated and notified of patients medical condition- Patient remains unresponsive and will not open eyes to command.    Explained to family course of therapy and the modalities   Patient with Progressive multiorgan failure with a very high probablity of a very minimal chance of meaningful recovery despite all aggressive and optimal medical therapy.  PATIENT REMAINS FULL CODE  Family understands the situation.  Family are satisfied with Plan of action and management. All questions answered  Additional CC time 25 mins   Hayden Mabin Patricia Pesa, M.D.  [facility]  Medical Director [place] Director [facility] Cardio-Pulmonary Department

## 2022-08-28 NOTE — Progress Notes (Signed)
eLink Physician-Brief Progress Note Patient Name: [name] DOB: [birth year] MRN: [id number]   Date of Service  08/28/2022  HPI/Events of Note  Brief New admit note: 26 y.o with significant PMH of T1DM, DKA, HTN, and generalized anxiety disorder who presented to the ED with chief complaints of altered mental status. Admitted to ICU for DKA.  - asp precautions - follow DKA protocol - follow labs.   Data: reviewed Lactic acid: >9.0,  CO2:<7, Anion Gap: unable to calculate Venous Blood Gas result:  pO2 54; pCO2 <18; pH 6.98;  HCO3 3.5, %O2 Sat 74.7 K 5.9. Cr 2.21 Wbc 23 K LA > 9.AST 52  Imaging:  CXR> No active cardiopulmonary process  Covid/flu neg EKG sinus tachy, qtc ok. Hcg < 1 UA neg for UTI Tox + for cannabinoid.  Camera evaluation done: On room air, confused. Able to protect airways.  On insuline gtt and fluids.   VTE: consider sq heparin  [name]. Neomia Glass, MD, FCCP. [phone]  eICU Interventions       Intervention Category Major Interventions: Other: (DKA) Evaluation Type: New Patient Evaluation  [name] 08/28/2022, 3:48 AM

## 2022-08-29 DIAGNOSIS — N179 Acute kidney failure, unspecified: Secondary | ICD-10-CM

## 2022-08-29 DIAGNOSIS — E0811 Diabetes mellitus due to underlying condition with ketoacidosis with coma: Secondary | ICD-10-CM | POA: Diagnosis not present

## 2022-08-29 DIAGNOSIS — G9341 Metabolic encephalopathy: Secondary | ICD-10-CM

## 2022-08-29 DIAGNOSIS — R400 Somnolence: Secondary | ICD-10-CM

## 2022-08-29 LAB — BASIC METABOLIC PANEL
Anion gap: 4 — ABNORMAL LOW (ref 5–15)
BUN: 23 mg/dL — ABNORMAL HIGH (ref 6–20)
CO2: 32 mmol/L (ref 22–32)
Calcium: 7.3 mg/dL — ABNORMAL LOW (ref 8.9–10.3)
Chloride: 103 mmol/L (ref 98–111)
Creatinine, Ser: 1.15 mg/dL — ABNORMAL HIGH (ref 0.44–1.00)
GFR, Estimated: >60 mL/min (ref >60)
Glucose, Bld: 164 mg/dL — ABNORMAL HIGH (ref 70–99)
Potassium: 3.3 mmol/L — ABNORMAL LOW (ref 3.5–5.1)
Sodium: 139 mmol/L (ref 135–145)

## 2022-08-29 LAB — POTASSIUM: Potassium: 4.2 mmol/L (ref 3.5–5.1)

## 2022-08-29 LAB — CBC
HCT: 29.2 % — ABNORMAL LOW (ref 36.0–46.0)
Hemoglobin: 10.1 g/dL — ABNORMAL LOW (ref 12.0–15.0)
MCH: 33 pg (ref 26.0–34.0)
MCHC: 34.6 g/dL (ref 30.0–36.0)
MCV: 95.4 fL (ref 80.0–100.0)
Platelets: 186 10*3/uL (ref 150–400)
RBC: 3.06 MIL/uL — ABNORMAL LOW (ref 3.87–5.11)
RDW: 12.2 % (ref 11.5–15.5)
WBC: 14.7 10*3/uL — ABNORMAL HIGH (ref 4.0–10.5)
nRBC: 0 % (ref 0.0–0.2)

## 2022-08-29 LAB — URINE CULTURE: Culture: NO GROWTH

## 2022-08-29 LAB — PHOSPHORUS
Phosphorus: 4 mg/dL (ref 2.5–4.6)
Phosphorus: 2.7 mg/dL (ref 2.5–4.6)

## 2022-08-29 LAB — GLUCOSE, CAPILLARY
Glucose-Capillary: 105 mg/dL — ABNORMAL HIGH (ref 70–99)
Glucose-Capillary: 154 mg/dL — ABNORMAL HIGH (ref 70–99)
Glucose-Capillary: 184 mg/dL — ABNORMAL HIGH (ref 70–99)
Glucose-Capillary: 195 mg/dL — ABNORMAL HIGH (ref 70–99)
Glucose-Capillary: 216 mg/dL — ABNORMAL HIGH (ref 70–99)
Glucose-Capillary: 50 mg/dL — ABNORMAL LOW (ref 70–99)
Glucose-Capillary: 53 mg/dL — ABNORMAL LOW (ref 70–99)
Glucose-Capillary: 54 mg/dL — ABNORMAL LOW (ref 70–99)
Glucose-Capillary: 98 mg/dL (ref 70–99)

## 2022-08-29 LAB — PROCALCITONIN: Procalcitonin: 2.97 ng/mL

## 2022-08-29 LAB — MAGNESIUM: Magnesium: 2.3 mg/dL (ref 1.7–2.4)

## 2022-08-29 MED ORDER — DEXTROSE 50 % IV SOLN
INTRAVENOUS | Status: AC
  Filled 2022-08-29: qty 50

## 2022-08-29 MED ORDER — DEXTROSE 50 % IV SOLN
12.5000 g | Freq: Once | INTRAVENOUS | Status: AC
  Administered 2022-08-29: 12.5 g via INTRAVENOUS

## 2022-08-29 MED ORDER — POTASSIUM CHLORIDE CRYS ER 20 MEQ PO TBCR
40.0000 meq | EXTENDED_RELEASE_TABLET | Freq: Once | ORAL | Status: AC
  Administered 2022-08-29: 40 meq via ORAL
  Filled 2022-08-29: qty 2

## 2022-08-29 NOTE — Progress Notes (Signed)
CHIEF COMPLAINT:   Chief Complaint  Patient presents with   Altered Mental Status   Hyperglycemia    Subjective    HPI  26 y.o with significant PMH of T1DM, DKA, HTN, and generalized anxiety disorder who presented to the ED with chief complaints of altered mental status.   Per EMS report, patient's family had been trying to reach her since yesterday and when they went to check on her, they found her altered. Last seen normal was yesterday per family members. On EMS arrival, patient was noted to have elevated blood glucose readings and very altered.   ED Course: Initial vital signs showed HR of 157 beats/minute, BP 144/87 mm Hg, the RR 46 breaths/minute, and the oxygen saturation 96  % on RA and a temperature of 98.41F (37.1C). Patient was lethargic, moaned intermittently, and responded with one-word answers; symmetric movement in the arms and legs was observed.  She was apparently agitated requiring sedation with Droperidol and Lorazepam.          Significant Hospital Events   10/30: Admit to the ICU with severe DKA 10/31 off pressors, DKA resolving    Consults:  Diabetes Co-ordinator   Procedures:  None   Significant Diagnostic Tests:  08/28/22: Chest Xray>No active cardiopulmonary process   Micro Data:  10/30: SARS-CoV-2 PCR> negative 10/30: Influenza PCR> negative 10/30: Blood culture x2> 10/30: Urine Culture> 10/30: MRSA PCR>>   Objective   ROS Limited due to Lethargy   Examination:  General exam: lethargic  but arousable Respiratory system: Clear to auscultation. Respiratory effort normal. Cardiovascular system: S1 & S2 heard, RRR. No JVD, murmurs, rubs, gallops or clicks. No pedal edema. Gastrointestinal system: Abdomen is nondistended, soft and nontender. No organomegaly or masses felt. Normal bowel sounds heard. Skin: No rashes, lesions or ulcers   VITALS:  weight is 67.4 kg. Her oral temperature is 99 F (37.2 C). Her blood pressure is 110/87 and  her pulse is 95. Her respiration is 24 (abnormal) and oxygen saturation is 95%.   I personally reviewed Labs under Results section.  Radiology Reports DG Chest Port 1 View  Result Date: 08/28/2022 CLINICAL DATA:  Sepsis EXAM: PORTABLE CHEST 1 VIEW COMPARISON:  11/05/2019 FINDINGS: The heart size and mediastinal contours are within normal limits. Both lungs are clear. The visualized skeletal structures are unremarkable. IMPRESSION: No active disease. Electronically Signed   By: Fidela Salisbury M.D.   On: 08/28/2022 02:44       Assessment/Plan:   SEVERE ACIDOSIS DUE TO DKA WITH METABOLIC ENCEPHALOPATHY DKA RESOLVING   ACUTE KIDNEY INJURY/Renal Failure -continue Foley Catheter-assess need -Avoid nephrotoxic agents -Follow urine output, BMP -Ensure adequate renal perfusion, optimize oxygenation -Renal dose medications   Intake/Output Summary (Last 24 hours) at 08/29/2022 0754 Last data filed at 08/29/2022 9024 Gross per 24 hour  Intake 4023.34 ml  Output 975 ml  Net 0973.53 ml       Metabolic Encephalopathy in the setting of above -UDS +Marijuana -Provide supportive care   ENDO - ICU hypoglycemic\Hyperglycemia protocol -check FSBS per protocol   GI GI PROPHYLAXIS as indicated  NUTRITIONAL STATUS DIET--> as tolerated Constipation protocol as indicated   ELECTROLYTES -follow labs as needed -replace as needed -pharmacy consultation and following    DVT/GI PRX  assessed I Assessed the need for Labs I Assessed the need for Foley I Assessed the need for Central Venous Line Family Discussion when available I Assessed the need for Mobilization I made an Assessment of medications to  be adjusted accordingly Safety Risk assessment completed  CASE DISCUSSED IN MULTIDISCIPLINARY ROUNDS WITH ICU TEAM   ICU NEEDS RESOLVING SD STATUS TRANSFER TO TRH  Synia Douglass Santiago Glad, M.D.  [facility]  Medical Director [facility]  [facility] Medical Director [facility] Cardio-Pulmonary Department

## 2022-08-29 NOTE — Inpatient Diabetes Management (Addendum)
Inpatient Diabetes Program Recommendations  AACE/ADA: New Consensus Statement on Inpatient Glycemic Control (2015)  Target Ranges:  Prepandial:   less than 140 mg/dL      Peak postprandial:   less than 180 mg/dL (1-2 hours)      Critically ill patients:  140 - 180 mg/dL   Lab Results  Component Value Date   GLUCAP 98 08/29/2022   HGBA1C 7.1 (H) 11/05/2019    Review of Glycemic Control  Latest Reference Range & Units 08/28/22 23:39 08/29/22 03:52 08/29/22 03:54 08/29/22 04:28 08/29/22 07:43  Glucose-Capillary 70 - 99 mg/dL 110 (H) 53 (L) 50 (L) 154 (H) 98  (H): Data is abnormally high (L): Data is abnormally low  Diabetes history:  DM1 Outpatient Diabetes medications:  Levemir 32 units BID Insulin Carb ratio--1 units:3 carbs Insulin correction factor--1 unit for every 50 > 150 A1C 9.6% on 08/10/22  Endo-Dr. Honor Junes Current orders for Inpatient glycemic control:  Levemir 10 units BID Novolog 0-9 units Q4H  Inpatient Diabetes Program Recommendations:    Levemir 6 units BID Novolog 0-6 units Q4H  Spoke with patient at bedside.  She states she thinks she may have had a stomach bug and could not keep anything dow.  She states she still administered her basal insulin.  Her Dexcom is missing.  She states it was on her arm as far as she can remember.  Spoke with Gae Bon, her mother and she does not have the transmitter and doesn't remember seeing it on [employer].  Will attempt to call [employer] and request a replacement transmitter.  Will also call Dr. Sherren Mocha office and try to find out where her starter kit for her Omnipod.  She confirms above home medications.    Addendum_0 :24:  Spoke with [employer] representative.  A replacement sensor and transmitter will be sent next day to her home address.  Also spoke with [facility] representaive  [phone]).  She is contacting Dr. Sherren Mocha nurse asking why the "Intro Kit" is being denied.  She will update me once she hears. Provided  information to patient.    Will continue to follow while inpatient.  Thank you, Selena Dixon, MSN, [place] Diabetes Coordinator Inpatient Diabetes Program [phone] (team pager from 8a-5p)

## 2022-08-29 NOTE — Consult Note (Signed)
[place] for Electrolyte Monitoring and Replacement   Recent Labs: Potassium (mmol/L)  Date Value  08/29/2022 3.3 (L)  03/18/2014 3.9   Magnesium (mg/dL)  Date Value  08/29/2022 2.3  08/26/2013 1.2 (L)   Calcium (mg/dL)  Date Value  08/29/2022 7.3 (L)   Calcium, Total (mg/dL)  Date Value  03/18/2014 8.7 (L)   Albumin (g/dL)  Date Value  08/28/2022 4.1  06/19/2018 3.3 (L)   Phosphorus (mg/dL)  Date Value  08/29/2022 4.0   Sodium (mmol/L)  Date Value  08/29/2022 139  07/03/2018 142  03/18/2014 134 (L)   Assessment: Patient is a 26 y/o F with medical history including T1DM, DKA, HTN, GAD who is admitted with DKA (anion gap 8 >4). Pharmacy consulted to assist with electrolyte monitoring and replacement as indicated.  Insulin gtt titrated off. Started on Levemir 10 units BID and sensitive SSI.   Goal of Therapy:  Electrolytes within normal limits  Plan:  --Hypokalemic KCl PO 40 mEq x 1 ordered by medical team --Follow up labs in AM  Wynelle Cleveland 08/29/2022 7:48 AM

## 2022-08-29 NOTE — Plan of Care (Signed)
  Problem: Education: Goal: Knowledge of General Education information will improve Description: Including pain rating scale, medication(s)/side effects and non-pharmacologic comfort measures Outcome: Progressing   Problem: Health Behavior/Discharge Planning: Goal: Ability to manage health-related needs will improve Outcome: Progressing   Problem: Clinical Measurements: Goal: Ability to maintain clinical measurements within normal limits will improve Outcome: Progressing Goal: Will remain free from infection Outcome: Progressing Goal: Diagnostic test results will improve Outcome: Progressing Goal: Respiratory complications will improve Outcome: Progressing Goal: Cardiovascular complication will be avoided Outcome: Progressing   Problem: Activity: Goal: Risk for activity intolerance will decrease Outcome: Progressing   Problem: Nutrition: Goal: Adequate nutrition will be maintained Outcome: Progressing   Problem: Coping: Goal: Level of anxiety will decrease Outcome: Progressing   Problem: Elimination: Goal: Will not experience complications related to bowel motility Outcome: Progressing Goal: Will not experience complications related to urinary retention Outcome: Progressing   Problem: Pain Managment: Goal: General experience of comfort will improve Outcome: Progressing   Problem: Skin Integrity: Goal: Risk for impaired skin integrity will decrease Outcome: Progressing   Problem: Safety: Goal: Ability to remain free from injury will improve Outcome: Progressing   Problem: Education: Goal: Ability to describe self-care measures that may prevent or decrease complications (Diabetes Survival Skills Education) will improve Outcome: Progressing Goal: Individualized Educational Video(s) Outcome: Progressing   Problem: Coping: Goal: Ability to adjust to condition or change in health will improve Outcome: Progressing   Problem: Fluid Volume: Goal: Ability to  maintain a balanced intake and output will improve Outcome: Progressing   Problem: Health Behavior/Discharge Planning: Goal: Ability to identify and utilize available resources and services will improve Outcome: Progressing Goal: Ability to manage health-related needs will improve Outcome: Progressing   Problem: Metabolic: Goal: Ability to maintain appropriate glucose levels will improve Outcome: Progressing   Problem: Nutritional: Goal: Maintenance of adequate nutrition will improve Outcome: Progressing Goal: Progress toward achieving an optimal weight will improve Outcome: Progressing   Problem: Skin Integrity: Goal: Risk for impaired skin integrity will decrease Outcome: Progressing   Problem: Tissue Perfusion: Goal: Adequacy of tissue perfusion will improve Outcome: Progressing   Problem: Education: Goal: Ability to describe self-care measures that may prevent or decrease complications (Diabetes Survival Skills Education) will improve Outcome: Progressing Goal: Individualized Educational Video(s) Outcome: Progressing   Problem: Coping: Goal: Ability to adjust to condition or change in health will improve Outcome: Progressing   Problem: Fluid Volume: Goal: Ability to maintain a balanced intake and output will improve Outcome: Progressing   Problem: Health Behavior/Discharge Planning: Goal: Ability to identify and utilize available resources and services will improve Outcome: Progressing Goal: Ability to manage health-related needs will improve Outcome: Progressing   Problem: Metabolic: Goal: Ability to maintain appropriate glucose levels will improve Outcome: Progressing   Problem: Nutritional: Goal: Maintenance of adequate nutrition will improve Outcome: Progressing Goal: Progress toward achieving an optimal weight will improve Outcome: Progressing   Problem: Skin Integrity: Goal: Risk for impaired skin integrity will decrease Outcome: Progressing    Problem: Tissue Perfusion: Goal: Adequacy of tissue perfusion will improve Outcome: Progressing

## 2022-08-30 ENCOUNTER — Inpatient Hospital Stay
Admit: 2022-08-30 | Discharge: 2022-08-30 | Disposition: A | Discharge status: Home / Self Care | Payer: Medicaid Other | Attending: Pulmonary Disease | Admitting: Pulmonary Disease

## 2022-08-30 ENCOUNTER — Inpatient Hospital Stay: Payer: Medicaid Other

## 2022-08-30 DIAGNOSIS — N179 Acute kidney failure, unspecified: Secondary | ICD-10-CM | POA: Diagnosis not present

## 2022-08-30 DIAGNOSIS — E0811 Diabetes mellitus due to underlying condition with ketoacidosis with coma: Secondary | ICD-10-CM | POA: Diagnosis not present

## 2022-08-30 DIAGNOSIS — R400 Somnolence: Secondary | ICD-10-CM | POA: Diagnosis not present

## 2022-08-30 LAB — BLOOD CULTURE ID PANEL (REFLEXED) - BCID2

## 2022-08-30 LAB — URINALYSIS, COMPLETE (UACMP) WITH MICROSCOPIC
Bacteria, UA: NONE SEEN
Bilirubin Urine: NEGATIVE
Glucose, UA: >=500 mg/dL — AB
Hgb urine dipstick: NEGATIVE
Ketones, ur: 20 mg/dL — AB
Nitrite: NEGATIVE
Protein, ur: 30 mg/dL — AB
Specific Gravity, Urine: 1.009 (ref 1.005–1.030)
WBC, UA: >50 WBC/hpf — ABNORMAL HIGH (ref 0–5)
pH: 9 — ABNORMAL HIGH (ref 5.0–8.0)

## 2022-08-30 LAB — ECHOCARDIOGRAM COMPLETE
AR max vel: 2.63 cm2
AV Area VTI: 2.55 cm2
AV Area mean vel: 2.51 cm2
AV Mean grad: 3 mmHg
AV Peak grad: 5.1 mmHg
Ao pk vel: 1.13 m/s
Area-P 1/2: 3.93 cm2
S' Lateral: 2.4 cm
Weight: 2574.97 oz

## 2022-08-30 LAB — GLUCOSE, CAPILLARY
Glucose-Capillary: 86 mg/dL (ref 70–99)
Glucose-Capillary: 37 mg/dL — CL (ref 70–99)
Glucose-Capillary: 142 mg/dL — ABNORMAL HIGH (ref 70–99)
Glucose-Capillary: 157 mg/dL — ABNORMAL HIGH (ref 70–99)
Glucose-Capillary: 165 mg/dL — ABNORMAL HIGH (ref 70–99)
Glucose-Capillary: 260 mg/dL — ABNORMAL HIGH (ref 70–99)

## 2022-08-30 LAB — BASIC METABOLIC PANEL
Anion gap: 3 — ABNORMAL LOW (ref 5–15)
BUN: 14 mg/dL (ref 6–20)
CO2: 27 mmol/L (ref 22–32)
Calcium: 8 mg/dL — ABNORMAL LOW (ref 8.9–10.3)
Chloride: 104 mmol/L (ref 98–111)
Creatinine, Ser: 1.04 mg/dL — ABNORMAL HIGH (ref 0.44–1.00)
GFR, Estimated: >60 mL/min (ref >60)
Glucose, Bld: 110 mg/dL — ABNORMAL HIGH (ref 70–99)
Potassium: 3.7 mmol/L (ref 3.5–5.1)
Sodium: 134 mmol/L — ABNORMAL LOW (ref 135–145)

## 2022-08-30 LAB — CBC
HCT: 29.5 % — ABNORMAL LOW (ref 36.0–46.0)
Hemoglobin: 9.9 g/dL — ABNORMAL LOW (ref 12.0–15.0)
MCH: 31.9 pg (ref 26.0–34.0)
MCHC: 33.6 g/dL (ref 30.0–36.0)
MCV: 95.2 fL (ref 80.0–100.0)
Platelets: 152 10*3/uL (ref 150–400)
RBC: 3.1 MIL/uL — ABNORMAL LOW (ref 3.87–5.11)
RDW: 11.9 % (ref 11.5–15.5)
WBC: 9.5 10*3/uL (ref 4.0–10.5)
nRBC: 0 % (ref 0.0–0.2)

## 2022-08-30 LAB — PROCALCITONIN: Procalcitonin: 1.6 ng/mL

## 2022-08-30 LAB — TROPONIN I (HIGH SENSITIVITY)
Troponin I (High Sensitivity): 94 ng/L — ABNORMAL HIGH (ref <18)
Troponin I (High Sensitivity): 73 ng/L — ABNORMAL HIGH (ref <18)

## 2022-08-30 LAB — MAGNESIUM: Magnesium: 2.2 mg/dL (ref 1.7–2.4)

## 2022-08-30 LAB — PHOSPHORUS: Phosphorus: 2.6 mg/dL (ref 2.5–4.6)

## 2022-08-30 MED ORDER — ONDANSETRON HCL 4 MG/2ML IJ SOLN
4.0000 mg | Freq: Once | INTRAMUSCULAR | Status: AC
  Administered 2022-08-30: 4 mg via INTRAVENOUS
  Filled 2022-08-30: qty 2

## 2022-08-30 MED ORDER — VANCOMYCIN HCL 1500 MG/300ML IV SOLN
1500.0000 mg | Freq: Once | INTRAVENOUS | Status: AC
  Administered 2022-08-30: 1500 mg via INTRAVENOUS
  Filled 2022-08-30: qty 300

## 2022-08-30 MED ORDER — INSULIN DETEMIR 100 UNIT/ML ~~LOC~~ SOLN
6.0000 [IU] | Freq: Two times a day (BID) | SUBCUTANEOUS | Status: DC
  Administered 2022-08-30 – 2022-09-01 (×4): 6 [IU] via SUBCUTANEOUS
  Filled 2022-08-30 (×5): qty 0.06

## 2022-08-30 MED ORDER — VANCOMYCIN HCL 750 MG/150ML IV SOLN
750.0000 mg | Freq: Two times a day (BID) | INTRAVENOUS | Status: DC
  Filled 2022-08-30: qty 150

## 2022-08-30 MED ORDER — ENOXAPARIN SODIUM 40 MG/0.4ML IJ SOSY
40.0000 mg | PREFILLED_SYRINGE | Freq: Every day | INTRAMUSCULAR | Status: DC
  Administered 2022-08-30 – 2022-09-02 (×4): 40 mg via SUBCUTANEOUS
  Filled 2022-08-30 (×4): qty 0.4

## 2022-08-30 MED ORDER — INSULIN ASPART 100 UNIT/ML IJ SOLN
0.0000 [IU] | Freq: Three times a day (TID) | INTRAMUSCULAR | Status: DC
  Administered 2022-08-30 – 2022-08-31 (×3): 2 [IU] via SUBCUTANEOUS
  Administered 2022-08-31 – 2022-09-01 (×3): 1 [IU] via SUBCUTANEOUS
  Administered 2022-09-01: 2 [IU] via SUBCUTANEOUS
  Filled 2022-08-30 (×7): qty 1

## 2022-08-30 MED ORDER — SODIUM CHLORIDE 0.9 % IV SOLN
1.0000 g | INTRAVENOUS | Status: DC
  Administered 2022-08-30 – 2022-09-01 (×3): 1 g via INTRAVENOUS
  Filled 2022-08-30 (×3): qty 1

## 2022-08-30 MED ORDER — SODIUM CHLORIDE 0.9% FLUSH: 10.0000 mL | INTRAVENOUS | Status: DC | PRN

## 2022-08-30 MED ORDER — DEXTROSE 50 % IV SOLN
1.0000 | Freq: Once | INTRAVENOUS | Status: AC
  Administered 2022-08-30: 50 mL via INTRAVENOUS

## 2022-08-30 MED ORDER — DEXTROSE 50 % IV SOLN
INTRAVENOUS | Status: AC
  Filled 2022-08-30: qty 50

## 2022-08-30 MED ORDER — SODIUM CHLORIDE 0.9 % IV SOLN: 1.0000 g | INTRAVENOUS | Status: DC

## 2022-08-30 NOTE — Inpatient Diabetes Management (Signed)
Inpatient Diabetes Program Recommendations  AACE/ADA: New Consensus Statement on Inpatient Glycemic Control (2015)  Target Ranges:  Prepandial:   less than 140 mg/dL      Peak postprandial:   less than 180 mg/dL (1-2 hours)      Critically ill patients:  140 - 180 mg/dL   Lab Results  Component Value Date   GLUCAP 142 (H) 08/30/2022   HGBA1C 7.1 (H) 11/05/2019    Review of Glycemic Control  Latest Reference Range & Units 08/30/22 03:09 08/30/22 07:49 08/30/22 08:30  Glucose-Capillary 70 - 99 mg/dL 86 37 (LL) 142 (H)  (LL): Data is critically low (H): Data is abnormally high  Diabetes history:  DM1 Outpatient Diabetes medications:  Levemir 32 units BID Insulin Carb ratio--1 units:3 carbs Insulin correction factor--1 unit for every 50 > 150 A1C 9.6% on 08/10/22  Endo-Dr. Honor Junes Current orders for Inpatient glycemic control:  Levemir 10 units BID Novolog 0-9 units TID   Inpatient Diabetes Program Recommendations:     Levemir 6 units BID  Will continue to follow while inpatient.  Thank you, Reche Dixon, MSN, [place] Diabetes Coordinator Inpatient Diabetes Program [phone] (team pager from 8a-5p)

## 2022-08-30 NOTE — Progress Notes (Signed)
Alerted by nursing, patient complaining of slight reproducible Left sided chest pain with correlating dyspnea. Patient also complaining of burning with urination. - UA ordered - STAT CXR 08/30/22: revealed a potential developing pneumonia with patchy airspace disease L>R - STAT EKG: I, Domingo Pulse [place], AGACNP-BC, personally viewed and interpreted this ECG. EKG Interpretation: Date: 08/30/22, EKG Time: 02:27, Rate: 92, Rhythm: NSR, QRS Axis:  normal, Intervals: normal, ST/T Wave abnormalities: none, Narrative Interpretation: NSR - STAT Troponin: 94, previous reading 265 (10/30)  Upon bedside assessment patient complaining of 6/10 intermittent reproducible pain and a non productive cough with some mild dyspnea. Discussed plan of care, all questions answered at this time.  CAP Elevated Troponin - Rocephin ordered - daily CBC, monitor WBC/fever curve >> PCT trending down but elevated - aggressive pulmonary toileting - trend troponin, consider heparin drip & cardiology consult as needed - echocardiogram ordered   Additional CC time 30 minutes   Venetia Night, AGACNP-BC Acute Care Nurse Practitioner [facility]   [phone] / [phone] Please see Amion for pager details.

## 2022-08-30 NOTE — Progress Notes (Signed)
Consultation Progress Note   Patient: [name] QPR:[id number] DOB: Nov 14, 1995 DOA: 08/28/2022 DOS: the patient was seen and examined on 08/30/2022 Primary service: Dyneisha Murchison, Manfred Shirts, MD  Brief hospital course: 26 y.o with significant PMH of T1DM, DKA, HTN, and generalized anxiety disorder who presented to the ED with chief complaints of altered mental status.  Per  EMS arrival, patient was noted to have elevated blood glucose readings and very altered. Labs were consistent with DKA.she was managed with initial intravenous fluid replacement, and intravenous (IV) bicarbonate push followed by intravenous insulin infusion as per DKA protocol. PCCM admit for further management of  severe DKA. Gap has closed and she is tolerating diet. She has been downgraded to medical floors  Assessment and Plan: Severe DKA-Resolved  Pneumonia-CXR reviewed by me shows patchy bilateral opacities, concerning for PNA. On ceftriaxone. Encouraged on IS use.  AKI-In the setting of dehydration and DKA. Continue IV hydration. Trend Scr daily, avoid nephrotoxins.  Anemia-Check Iron , TIBC, Vitamin b12, folate.     TRH will continue to follow the patient.  Subjective: Reports a cough, non productive. Denies fevers and chills  Physical Exam:  Physical Exam Vitals reviewed.  Constitutional:      Appearance: Normal appearance.  HENT:     Head: Normocephalic and atraumatic.     Mouth/Throat:     Mouth: Mucous membranes are moist.  Eyes:     Extraocular Movements: Extraocular movements intact.     Pupils: Pupils are equal, round, and reactive to light.  Cardiovascular:     Rate and Rhythm: Normal rate and regular rhythm.     Pulses: Normal pulses.  Pulmonary:     Effort: Pulmonary effort is normal.     Breath sounds: Normal breath sounds.  Abdominal:     General: Abdomen is flat.     Palpations: Abdomen is soft.  Musculoskeletal:        General: Normal range of motion.     Cervical back: Normal  range of motion and neck supple.  Skin:    General: Skin is warm.     Capillary Refill: Capillary refill takes less than 2 seconds.  Neurological:     General: No focal deficit present.     Mental Status: She is alert.  Psychiatric:        Mood and Affect: Mood normal.     Vitals:   08/30/22 0900 08/30/22 1000 08/30/22 1100 08/30/22 1136  BP: (!) 134/104 (!) 139/102 129/88 (!) 141/110  Pulse: 84 87 77 89  Resp: (!) 22 19 20 18   Temp:    98.4 F (36.9 C)  TempSrc:    Oral  SpO2: 99% 94% 95% 93%  Weight:        Data Reviewed:  There are no new results to review at this time.  Family Communication: No family at bedside  Time spent: 15 minutes.  Author: Cristela Felt, MD 08/30/2022 11:41 AM  For on call review [email or web address].

## 2022-08-30 NOTE — Consult Note (Signed)
Pharmacy Antibiotic Note  [name] is a 26 y.o. female admitted on 08/28/2022 with  DKA .  Pharmacy has been consulted for Vancomycin dosing.  Plan: Day 1 of antibiotics Give Vancomycin 1500 mg IV x1 followed by 750 mg IV Q12H. Goal AUC 400-550. Expected AUC: 426.1 Expected Css min: 12.4 SCr used: 1.04  Weight used: IBW, Vd used: 0.72 (BMI 25.9) Patient is also on Ceftriaxone 1 g IV Q24H Continue to monitor renal function and follow culture results   Weight:  (bed not zeroed)  Temp (24hrs), Avg:98.9 F (37.2 C), Min:98.4 F (36.9 C), Max:99.2 F (37.3 C)  Recent Labs  Lab 08/28/22 0156 08/28/22 0212 08/28/22 0544 08/28/22 0901 08/28/22 0945 08/28/22 1404 08/28/22 2015 08/29/22 0429 08/30/22 0229  WBC 23.6*  --  17.3*  --   --   --   --  14.7* 9.5  CREATININE 2.21*  --  2.31*  --  1.83* 1.50* 1.27* 1.15* 1.04*  LATICACIDVEN  --  >9.0*  --  6.3*  --   --   --   --   --     Estimated Creatinine Clearance: 82.4 mL/min (A) (by C-G formula based on SCr of 1.04 mg/dL (H)).    Allergies  Allergen Reactions   Sulfamethoxazole-Trimethoprim Hives   Pravastatin Other (See Comments)    Antimicrobials this admission: 11/1 Ceftriaxone >>  11/1 Vancomycin >>   Microbiology results: 11/1 BCx: 1 out of 4 bottles (anaerobic) growing GPC. BCID detects Staph spp. 11/1 BCx: ordered 10/30 UCx: NG final  10/30 MRSA PCR: negative  Thank you for allowing pharmacy to be a part of this patient's care.  Gretel Acre, PharmD PGY1 Pharmacy Resident 08/30/2022 8:51 PM

## 2022-08-30 NOTE — Progress Notes (Signed)
*  PRELIMINARY RESULTS* Echocardiogram 2D Echocardiogram has been performed.  [name] 08/30/2022, 12:38 PM

## 2022-08-30 NOTE — Progress Notes (Signed)
End of shift note:   Pt was transferred from ICU. VSS. Schedule medications were given.

## 2022-08-30 NOTE — Progress Notes (Addendum)
PHARMACY - PHYSICIAN COMMUNICATION CRITICAL VALUE ALERT - BLOOD CULTURE IDENTIFICATION (BCID)  [name] is an 26 y.o. female who presented to [facility] on 08/28/2022 with a chief complaint of DKA  Assessment: 1 out of 4 bottles (anaerobic) growing GPC. BCID detects Staph spp. Resistance detection unavailable. Possible contaminant.   Name of physician (or Provider) Contacted: Neomia Glass, NP  Current antibiotics: Ceftriaxone  Changes to prescribed antibiotics recommended: Repeat blood cultures. Initiate vancomycin if clinically indicated.  Response not yet received from provider; current antibiotics do not provide ideal coverage of isolated organism(s). Follow up speciation and need for further treatment.  Results for orders placed or performed during the hospital encounter of 08/28/22  Blood Culture ID Panel (Reflexed) (Collected: 08/30/2022  2:29 AM)  Result Value Ref Range   Enterococcus faecalis NOT DETECTED NOT DETECTED   Enterococcus Faecium NOT DETECTED NOT DETECTED   Listeria monocytogenes NOT DETECTED NOT DETECTED   Staphylococcus species DETECTED (A) NOT DETECTED   Staphylococcus aureus (BCID) NOT DETECTED NOT DETECTED   Staphylococcus epidermidis NOT DETECTED NOT DETECTED   Staphylococcus lugdunensis NOT DETECTED NOT DETECTED   Streptococcus species NOT DETECTED NOT DETECTED   Streptococcus agalactiae NOT DETECTED NOT DETECTED   Streptococcus pneumoniae NOT DETECTED NOT DETECTED   Streptococcus pyogenes NOT DETECTED NOT DETECTED   A.calcoaceticus-baumannii NOT DETECTED NOT DETECTED   Bacteroides fragilis NOT DETECTED NOT DETECTED   Enterobacterales NOT DETECTED NOT DETECTED   Enterobacter cloacae complex NOT DETECTED NOT DETECTED   Escherichia coli NOT DETECTED NOT DETECTED   Klebsiella aerogenes NOT DETECTED NOT DETECTED   Klebsiella oxytoca NOT DETECTED NOT DETECTED   Klebsiella pneumoniae NOT DETECTED NOT DETECTED   Proteus species NOT DETECTED NOT DETECTED    Salmonella species NOT DETECTED NOT DETECTED   Serratia marcescens NOT DETECTED NOT DETECTED   Haemophilus influenzae NOT DETECTED NOT DETECTED   Neisseria meningitidis NOT DETECTED NOT DETECTED   Pseudomonas aeruginosa NOT DETECTED NOT DETECTED   Stenotrophomonas maltophilia NOT DETECTED NOT DETECTED   Candida albicans NOT DETECTED NOT DETECTED   Candida auris NOT DETECTED NOT DETECTED   Candida glabrata NOT DETECTED NOT DETECTED   Candida krusei NOT DETECTED NOT DETECTED   Candida parapsilosis NOT DETECTED NOT DETECTED   Candida tropicalis NOT DETECTED NOT DETECTED   Cryptococcus neoformans/gattii NOT DETECTED NOT [place], PharmD PGY1 Pharmacy Resident 08/30/2022 7:55 PM

## 2022-08-30 NOTE — Progress Notes (Signed)

## 2022-08-30 NOTE — Progress Notes (Signed)
Pt was transferred off the floor to rm 206 (2C). VS stable. Ambulated to the new bed. Bed in low position. Belongings (bracelets are with the patient). Family called and updated

## 2022-08-31 DIAGNOSIS — E1011 Type 1 diabetes mellitus with ketoacidosis with coma: Secondary | ICD-10-CM | POA: Diagnosis not present

## 2022-08-31 DIAGNOSIS — I5032 Chronic diastolic (congestive) heart failure: Secondary | ICD-10-CM | POA: Diagnosis present

## 2022-08-31 DIAGNOSIS — E663 Overweight: Secondary | ICD-10-CM | POA: Diagnosis present

## 2022-08-31 DIAGNOSIS — E1065 Type 1 diabetes mellitus with hyperglycemia: Secondary | ICD-10-CM | POA: Diagnosis not present

## 2022-08-31 DIAGNOSIS — J189 Pneumonia, unspecified organism: Secondary | ICD-10-CM

## 2022-08-31 LAB — GLUCOSE, CAPILLARY
Glucose-Capillary: 189 mg/dL — ABNORMAL HIGH (ref 70–99)
Glucose-Capillary: 148 mg/dL — ABNORMAL HIGH (ref 70–99)
Glucose-Capillary: 129 mg/dL — ABNORMAL HIGH (ref 70–99)
Glucose-Capillary: 203 mg/dL — ABNORMAL HIGH (ref 70–99)

## 2022-08-31 LAB — PROCALCITONIN: Procalcitonin: 0.64 ng/mL

## 2022-08-31 LAB — BASIC METABOLIC PANEL
Anion gap: 7 (ref 5–15)
BUN: 13 mg/dL (ref 6–20)
CO2: 22 mmol/L (ref 22–32)
Calcium: 8.1 mg/dL — ABNORMAL LOW (ref 8.9–10.3)
Chloride: 110 mmol/L (ref 98–111)
Creatinine, Ser: 1.03 mg/dL — ABNORMAL HIGH (ref 0.44–1.00)
GFR, Estimated: >60 mL/min (ref >60)
Glucose, Bld: 204 mg/dL — ABNORMAL HIGH (ref 70–99)
Potassium: 4 mmol/L (ref 3.5–5.1)
Sodium: 139 mmol/L (ref 135–145)

## 2022-08-31 LAB — HEMOGLOBIN A1C
Hgb A1c MFr Bld: 9.3 % — ABNORMAL HIGH (ref 4.8–5.6)
Mean Plasma Glucose: 220.21 mg/dL

## 2022-08-31 LAB — BRAIN NATRIURETIC PEPTIDE: B Natriuretic Peptide: 526.1 pg/mL — ABNORMAL HIGH (ref 0.0–100.0)

## 2022-08-31 MED ORDER — FUROSEMIDE 10 MG/ML IJ SOLN
20.0000 mg | Freq: Two times a day (BID) | INTRAMUSCULAR | Status: DC
  Administered 2022-08-31 – 2022-09-01 (×3): 20 mg via INTRAVENOUS
  Filled 2022-08-31 (×3): qty 4

## 2022-08-31 MED ORDER — ONDANSETRON HCL 4 MG/2ML IJ SOLN
4.0000 mg | Freq: Once | INTRAMUSCULAR | Status: AC
  Administered 2022-08-31: 4 mg via INTRAVENOUS
  Filled 2022-08-31: qty 2

## 2022-08-31 NOTE — Assessment & Plan Note (Signed)
Suspected pneumonia.  Procalcitonin of 3 on admission and slowly been improving.  Continue total of antibiotics x5 days.  Cough has been nonproductive.

## 2022-08-31 NOTE — Progress Notes (Signed)
[facility] Progress Note  Patient: [name]    ZJI:[id number]  DOA: 08/28/2022    Date of Service: the patient was seen and examined on 08/31/2022  Brief hospital course: 26 year old female with past medical history of diabetes mellitus type 1 brought in on 10/29 after family could not reach her during the day previous.  When they found her, patient looked to be confused and agitated.  CBGs markedly elevated.  Lab work revealed severe DKA with pH of 6.98.  Labs noted questionable pneumonia and elevated procalcitonin.  Patient started on IV fluids and insulin drip.  Initially, she was so volume depleted she required pressor support.  Following treatment and improvement, able to wean off of insulin drip and pressors and fluids.  Mentation normalized.  Transferred to hospitalist service on 11/1.  Patient initially had received 1 dose of antibiotics in the emergency room and then antibiotics were resumed on 11/1.  Assessment and Plan: Assessment and Plan: * DKA (diabetic ketoacidosis) (HCC)-resolved as of 08/31/2022 Secondary to pneumonia, although suspect some compliance issues were also in part from this.  Off of insulin drip and stabilized.  Acute metabolic encephalopathy-resolved as of 08/31/2022 Resolved, secondary to DKA  Uncontrolled type 1 diabetes mellitus with hyperglycemia, with long-term current use of insulin (HCC) A1c pending.  Patient endorses good compliance and controlled CBGs up until this past year when her father passed away.  Since then, she has not been taking her insulin consistently.  Likely contributing factor to her underlying DKA presentation.  In addition, patient normally on Levemir 32 units.  She currently is only on 6 twice a day and has been eating poorly.  Chronic diastolic CHF (congestive heart failure) (HCC) Noted to have elevated blood pressures and some of her cough could be from CHF.  BNP pending.  Echocardiogram done on 11/1 notes grade 2 diastolic  dysfunction.  We will ask cardiology for evaluation  CAP (community acquired pneumonia) Suspected pneumonia.  Procalcitonin of 3 on admission and slowly been improving.  Continue total of antibiotics x5 days.  Cough has been nonproductive.  Acute renal failure (HCC)-resolved as of 08/31/2022 Secondary to DKA and volume depletion.  Resolved with IV fluids.  Lactic acidosis-resolved as of 08/31/2022 Secondary to intravascular volume depletion from DKA.  This is not sepsis, sepsis ruled out  Overweight (BMI 25.0-29.9) Meets criteria with BMI greater than 25       Body mass index is 25.98 kg/m.        Consultants: Critical care  Procedures: Echocardiogram noting grade 2 diastolic dysfunction  Antimicrobials: Antibiotics Given (last 72 hours)     Date/Time Action Medication Dose Rate   08/30/22 0446 New Bag/Given   cefTRIAXone (ROCEPHIN) 1 g in sodium chloride 0.9 % 100 mL IVPB 1 g 200 mL/hr   08/30/22 2210 New Bag/Given   vancomycin (VANCOREADY) IVPB 1500 mg/300 mL 1,500 mg 150 mL/hr   08/31/22 0600 New Bag/Given   cefTRIAXone (ROCEPHIN) 1 g in sodium chloride 0.9 % 100 mL IVPB 1 g 200 mL/hr         Code Status: Full code   Subjective: Complains of cough, fatigue  Objective: Noted elevated blood pressures Vitals:   08/31/22 0538 08/31/22 0810  BP: (!) 154/107 (!) 166/101  Pulse: 71 60  Resp: 18 18  Temp: 98.9 F (37.2 C) 98.5 F (36.9 C)  SpO2: 100% 100%   No intake or output data in the 24 hours ending 08/31/22 1437 Filed Weights   08/28/22  0240 08/28/22 0420 08/29/22 1200  Weight: 72 kg 67.4 kg 73 kg   Body mass index is 25.98 kg/m.  Exam:  General: Alert and oriented x3, no acute distress HEENT: Normocephalic, atraumatic, mucous membranes are moist Cardiovascular: Regular rate and rhythm, S1-S2 Respiratory: Decreased breath sounds bibasilar Abdomen: Soft, nontender, nondistended, positive bowel sounds Musculoskeletal: No clubbing or cyanosis or  edema Skin: Skin breaks, tears or lesions Psychiatry: Patient is appropriate, no evidence of psychoses or confusion Neurology: No focal deficits  Data Reviewed: Creatinine 1.03 with normal GFR.,  Procalcitonin down to 0.64  Disposition:  Status is: Inpatient Remains inpatient appropriate because: Evaluation of diastolic function Improvement in p.o. and closer to home regimen of Levemir    Anticipated discharge date: 11/4   Family Communication: We will call patient's mother DVT Prophylaxis: enoxaparin (LOVENOX) injection 40 mg Start: 08/30/22 2200 SCDs Start: 08/28/22 0304    Author: Annita Brod ,MD 08/31/2022 2:37 PM  To reach On-call, see care teams to locate the attending and reach out via [email or web address]. Between 7PM-7AM, please contact night-coverage If you still have difficulty reaching the attending provider, please page the [facility] (Director on Call) for [facility] on amion for assistance.

## 2022-08-31 NOTE — Assessment & Plan Note (Signed)
Secondary to intravascular volume depletion from DKA.  This is not sepsis, sepsis ruled out

## 2022-08-31 NOTE — TOC Initial Note (Signed)
Transition of Care [facility]) - Initial/Assessment Note    Patient Details  Name: [name] MRN: [id number] Date of Birth: 05-14-1996  [facility] CM/SW Contact:    Chapman Fitch, RN Phone Number: 08/31/2022, 10:44 AM  Clinical Narrative:                   Transition of Care (TOC) Screening Note   Patient Details  Name: [name] Date of Birth: [birth year]   Transition of Care [facility]) CM/SW Contact:    Chapman Fitch, RN Phone Number: 08/31/2022, 10:44 AM    Transition of Care Department (TOC) has reviewed patient and no TOC needs have been identified at this time. We will continue to monitor patient advancement through interdisciplinary progression rounds. If new patient transition needs arise, please place a TOC consult.  Patient confirms she is followed by outpatient endocrinology.  Patient does not have PCP listed.  She states that [facility] is listed on her Medicaid care.  I notified her that since [facility] listed on her card this would be the office she needs to establish care with, unless she calls Medicaid to request a different practice        Patient Goals and CMS Choice        Expected Discharge Plan and Services                                                Prior Living Arrangements/Services                       Activities of Daily Living Home Assistive Devices/Equipment: Other (Comment) (diabetic pt (type 1)) ADL Screening (condition at time of admission) Patient's cognitive ability adequate to safely complete daily activities?: Yes Is the patient deaf or have difficulty hearing?: No Does the patient have difficulty seeing, even when wearing glasses/contacts?: No Does the patient have difficulty concentrating, remembering, or making decisions?: No Patient able to express need for assistance with ADLs?: No Does the patient have difficulty dressing or bathing?: No Independently performs ADLs?: Yes  (appropriate for developmental age) Does the patient have difficulty walking or climbing stairs?: No Weakness of Legs: Both Weakness of Arms/Hands: None  Permission Sought/Granted                  Emotional Assessment              Admission diagnosis:  Hyperkalemia [E87.5] Lactic acidosis [E87.20] DKA (diabetic ketoacidosis) (HCC) [E11.10] Acute renal failure, unspecified acute renal failure type (HCC) [N17.9] Altered mental status, unspecified altered mental status type [R41.82] Type 1 diabetes mellitus with ketoacidotic coma (HCC) [E10.11] Leukocytosis, unspecified type [D72.829] Patient Active Problem List   Diagnosis Date Noted   DKA (diabetic ketoacidosis) (HCC) 08/28/2022   Altered mental status    Lactic acidosis    DKA, type 1 (HCC) 11/05/2019   Hyperkalemia 11/05/2019   Pre-existing diabetes mellitus affecting childbirth 06/26/2018   Elevated blood pressure affecting pregnancy in third trimester, antepartum 06/20/2018   Labor and delivery indication for care or intervention 06/20/2018   PIH (pregnancy induced hypertension) 06/20/2018   Gastroesophageal reflux in pregnancy 05/13/2018   Type 1 diabetes mellitus without complication (HCC) 03/07/2018   Generalized anxiety disorder 01/31/2018   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 [place] [address] Phone: [phone] Fax: [phone]     Social Determinants of Health (SDOH) Interventions    Readmission Risk Interventions     No data to display

## 2022-08-31 NOTE — Assessment & Plan Note (Signed)
Secondary to DKA and volume depletion.  Resolved with IV fluids.

## 2022-08-31 NOTE — Inpatient Diabetes Management (Signed)
Inpatient Diabetes Program Recommendations  AACE/ADA: New Consensus Statement on Inpatient Glycemic Control (2015)  Target Ranges:  Prepandial:   less than 140 mg/dL      Peak postprandial:   less than 180 mg/dL (1-2 hours)      Critically ill patients:  140 - 180 mg/dL   Lab Results  Component Value Date   GLUCAP 148 (H) 08/31/2022   HGBA1C 7.1 (H) 11/05/2019     Received follow up phone call from  Rhonda, Omnipod representaive  ([phone]).  She has been working with Dr. O'Connell's office on trying to figure out why the Intro kit for the Omnipod is being denied.  If she is unable to obtain the intro kit, Rhonda will supply her with one.  Gave patient Rhonda's contact number and updated her progress of obtaining kit.  Once she gets the intro kit, Rhonda will set up a time for pump training.    Will continue to follow while inpatient.  Thank you, Jen [name], MSN, [facility] Diabetes Coordinator Inpatient Diabetes Program [phone] (team pager from 8a-5p)     

## 2022-08-31 NOTE — Assessment & Plan Note (Signed)
Secondary to pneumonia, although suspect some compliance issues were also in part from this.  Off of insulin drip and stabilized.

## 2022-08-31 NOTE — Assessment & Plan Note (Addendum)
Noted to have elevated blood pressures and some of her cough could be from CHF.  BNP mildly elevated.  Echocardiogram done on 11/1 notes grade 2 diastolic dysfunction, although in follow-up, this looks to be slightly over read and may be secondary to the aggressive volume resuscitation rather than true CHF.  Continue Lasix.

## 2022-08-31 NOTE — Assessment & Plan Note (Addendum)
A1c of 9.3, up from several years ago.  Patient endorses good compliance and controlled CBGs up until this past year when her father passed away.  Since then, she has not been taking her insulin consistently.  Likely contributing factor to her underlying DKA presentation.  In addition, patient normally on Levemir 32 units twice daily.  She currently is only on 6 twice a day and has been eating poorly, even having episode of hypoglycemia this morning.  Diabetes coordinator also following.  As per the recommendations, change evening Levemir dose from 6 units to 4 units and also check a 3 AM blood sugar.

## 2022-08-31 NOTE — Hospital Course (Addendum)
26 year old female with past medical history of diabetes mellitus type 1 brought in on 10/29 after family could not reach her during the day previous.  When they found her, patient looked to be confused and agitated.  CBGs markedly elevated.  Lab work revealed severe DKA with pH of 6.98.  Labs noted questionable pneumonia and elevated procalcitonin.  Patient started on IV fluids and insulin drip.  Initially, she was so volume depleted she required pressor support.  Following treatment and improvement, able to wean off of insulin drip and pressors and fluids.  Mentation normalized.  Transferred to hospitalist service on 11/1.  Patient initially had received 1 dose of antibiotics in the emergency room and then antibiotics were resumed on 11/1.

## 2022-08-31 NOTE — Assessment & Plan Note (Signed)
Meets criteria with BMI greater than 25 

## 2022-08-31 NOTE — Assessment & Plan Note (Signed)
Resolved, secondary to DKA

## 2022-09-01 DIAGNOSIS — E663 Overweight: Secondary | ICD-10-CM

## 2022-09-01 DIAGNOSIS — I5032 Chronic diastolic (congestive) heart failure: Secondary | ICD-10-CM | POA: Diagnosis not present

## 2022-09-01 DIAGNOSIS — J189 Pneumonia, unspecified organism: Secondary | ICD-10-CM | POA: Diagnosis not present

## 2022-09-01 DIAGNOSIS — E1011 Type 1 diabetes mellitus with ketoacidosis with coma: Secondary | ICD-10-CM | POA: Diagnosis not present

## 2022-09-01 LAB — BASIC METABOLIC PANEL
Anion gap: 7 (ref 5–15)
BUN: 8 mg/dL (ref 6–20)
CO2: 24 mmol/L (ref 22–32)
Calcium: 8.4 mg/dL — ABNORMAL LOW (ref 8.9–10.3)
Chloride: 109 mmol/L (ref 98–111)
Creatinine, Ser: 0.92 mg/dL (ref 0.44–1.00)
GFR, Estimated: >60 mL/min (ref >60)
Glucose, Bld: 58 mg/dL — ABNORMAL LOW (ref 70–99)
Potassium: 3.2 mmol/L — ABNORMAL LOW (ref 3.5–5.1)
Sodium: 140 mmol/L (ref 135–145)

## 2022-09-01 LAB — GLUCOSE, CAPILLARY
Glucose-Capillary: 48 mg/dL — ABNORMAL LOW (ref 70–99)
Glucose-Capillary: 51 mg/dL — ABNORMAL LOW (ref 70–99)
Glucose-Capillary: 200 mg/dL — ABNORMAL HIGH (ref 70–99)
Glucose-Capillary: 155 mg/dL — ABNORMAL HIGH (ref 70–99)
Glucose-Capillary: 89 mg/dL (ref 70–99)
Glucose-Capillary: 389 mg/dL — ABNORMAL HIGH (ref 70–99)
Glucose-Capillary: 346 mg/dL — ABNORMAL HIGH (ref 70–99)

## 2022-09-01 MED ORDER — INSULIN ASPART 100 UNIT/ML IJ SOLN
0.0000 [IU] | Freq: Three times a day (TID) | INTRAMUSCULAR | Status: DC
  Administered 2022-09-01: 7 [IU] via SUBCUTANEOUS
  Administered 2022-09-02: 3 [IU] via SUBCUTANEOUS
  Administered 2022-09-02: 0 [IU] via SUBCUTANEOUS
  Filled 2022-09-01 (×3): qty 1

## 2022-09-01 MED ORDER — INSULIN ASPART 100 UNIT/ML IJ SOLN: 0.0000 [IU] | Freq: Three times a day (TID) | INTRAMUSCULAR | Status: DC

## 2022-09-01 MED ORDER — INSULIN DETEMIR 100 UNIT/ML ~~LOC~~ SOLN
6.0000 [IU] | Freq: Every day | SUBCUTANEOUS | Status: DC
  Filled 2022-09-01: qty 0.06

## 2022-09-01 MED ORDER — LOSARTAN POTASSIUM 25 MG PO TABS
25.0000 mg | ORAL_TABLET | Freq: Every day | ORAL | Status: DC
  Administered 2022-09-01 – 2022-09-02 (×2): 25 mg via ORAL
  Filled 2022-09-01 (×2): qty 1

## 2022-09-01 MED ORDER — AMOXICILLIN-POT CLAVULANATE 875-125 MG PO TABS
1.0000 | ORAL_TABLET | Freq: Two times a day (BID) | ORAL | Status: DC
  Administered 2022-09-02 – 2022-09-03 (×3): 1 via ORAL
  Filled 2022-09-01 (×3): qty 1

## 2022-09-01 MED ORDER — INSULIN DETEMIR 100 UNIT/ML ~~LOC~~ SOLN
4.0000 [IU] | Freq: Every day | SUBCUTANEOUS | Status: DC
  Administered 2022-09-01 – 2022-09-02 (×2): 4 [IU] via SUBCUTANEOUS
  Filled 2022-09-01 (×2): qty 0.04

## 2022-09-01 NOTE — Progress Notes (Signed)
[facility] Progress Note  Patient: [name]    OEU:[id number]  DOA: 08/28/2022    Date of Service: the patient was seen and examined on 09/01/2022  Brief hospital course: 26 year old female with past medical history of diabetes mellitus type 1 brought in on 10/29 after family could not reach her during the day previous.  When they found her, patient looked to be confused and agitated.  CBGs markedly elevated.  Lab work revealed severe DKA with pH of 6.98.  Labs noted questionable pneumonia and elevated procalcitonin.  Patient started on IV fluids and insulin drip.  Initially, she was so volume depleted she required pressor support.  Following treatment and improvement, able to wean off of insulin drip and pressors and fluids.  Mentation normalized.  Transferred to hospitalist service on 11/1.  Patient initially had received 1 dose of antibiotics in the emergency room and then antibiotics were resumed on 11/1.  Assessment and Plan: Assessment and Plan: * DKA (diabetic ketoacidosis) (HCC)-resolved as of 08/31/2022 Secondary to pneumonia, although suspect some compliance issues were also in part from this.  Off of insulin drip and stabilized.  Acute metabolic encephalopathy-resolved as of 08/31/2022 Resolved, secondary to DKA  Uncontrolled type 1 diabetes mellitus with hyperglycemia, with long-term current use of insulin (HCC) A1c of 9.3, up from several years ago.  Patient endorses good compliance and controlled CBGs up until this past year when her father passed away.  Since then, she has not been taking her insulin consistently.  Likely contributing factor to her underlying DKA presentation.  In addition, patient normally on Levemir 32 units twice daily.  She currently is only on 6 twice a day and has been eating poorly, even having episode of hypoglycemia this morning.  Diabetes coordinator also following.  As per the recommendations, change evening Levemir dose from 6 units to 4 units and  also check a 3 AM blood sugar.  Chronic diastolic CHF (congestive heart failure) (HCC) Noted to have elevated blood pressures and some of her cough could be from CHF.  BNP mildly elevated.  Echocardiogram done on 11/1 notes grade 2 diastolic dysfunction, although in follow-up, this looks to be slightly over read and may be secondary to the aggressive volume resuscitation rather than true CHF.  Continue Lasix.  CAP (community acquired pneumonia) Suspected pneumonia.  Procalcitonin of 3 on admission and slowly been improving.  Continue total of antibiotics x5 days.  Cough has been nonproductive.  Acute renal failure (HCC)-resolved as of 08/31/2022 Secondary to DKA and volume depletion.  Resolved with IV fluids.  Lactic acidosis-resolved as of 08/31/2022 Secondary to intravascular volume depletion from DKA.  This is not sepsis, sepsis ruled out  Overweight (BMI 25.0-29.9) Meets criteria with BMI greater than 25       Body mass index is 25.98 kg/m.        Consultants: Critical care  Procedures: Echocardiogram noting grade 2 diastolic dysfunction  Antimicrobials: Antibiotics Given (last 72 hours)     Date/Time Action Medication Dose Rate   08/30/22 0446 New Bag/Given   cefTRIAXone (ROCEPHIN) 1 g in sodium chloride 0.9 % 100 mL IVPB 1 g 200 mL/hr   08/30/22 2210 New Bag/Given   vancomycin (VANCOREADY) IVPB 1500 mg/300 mL 1,500 mg 150 mL/hr   08/31/22 0600 New Bag/Given   cefTRIAXone (ROCEPHIN) 1 g in sodium chloride 0.9 % 100 mL IVPB 1 g 200 mL/hr   09/01/22 0543 New Bag/Given   cefTRIAXone (ROCEPHIN) 1 g in sodium chloride 0.9 %  100 mL IVPB 1 g 200 mL/hr         Code Status: Full code   Subjective: Feeling better, appetite starting to improve  Objective: Noted elevated blood pressures Vitals:   09/01/22 1000 09/01/22 1521  BP:  (!) 142/101  Pulse: 68 68  Resp:  18  Temp:  98.1 F (36.7 C)  SpO2:  98%    Intake/Output Summary (Last 24 hours) at 09/01/2022  1721 Last data filed at 09/01/2022 1200 Gross per 24 hour  Intake 600 ml  Output --  Net 600 ml   Filed Weights   08/28/22 0240 08/28/22 0420 08/29/22 1200  Weight: 72 kg 67.4 kg 73 kg   Body mass index is 25.98 kg/m.  Exam:  General: Alert and oriented x3, no acute distress HEENT: Normocephalic, atraumatic, mucous membranes are moist Cardiovascular: Regular rate and rhythm, S1-S2 Respiratory: Decreased breath sounds bibasilar Abdomen: Soft, nontender, nondistended, positive bowel sounds Musculoskeletal: No clubbing or cyanosis or edema Skin: Skin breaks, tears or lesions Psychiatry: Patient is appropriate, no evidence of psychoses or confusion Neurology: No focal deficits  Data Reviewed: Creatinine normal.  Potassium at 3.2.  BNP of 526.  Disposition:  Status is: Inpatient Remains inpatient appropriate because: -Improved appetite so Levemir can be titrated up -Full diuresis    Anticipated discharge date: 11/5   Family Communication: We will call patient's mother DVT Prophylaxis: enoxaparin (LOVENOX) injection 40 mg Start: 08/30/22 2200 SCDs Start: 08/28/22 0304    Author: Hollice Espy ,MD 09/01/2022 5:21 PM  To reach On-call, see care teams to locate the attending and reach out via [email or web address]. Between 7PM-7AM, please contact night-coverage If you still have difficulty reaching the attending provider, please page the [facility] (Director on Call) for [facility] on amion for assistance.

## 2022-09-01 NOTE — Progress Notes (Addendum)
CBG 48 with am, dextrose 50%  28ml  administered at 0823 rechecked CBG 15 mins  later 200.

## 2022-09-01 NOTE — Progress Notes (Signed)
Inpatient Diabetes Program Recommendations  AACE/ADA: New Consensus Statement on Inpatient Glycemic Control (2015)  Target Ranges:  Prepandial:   less than 140 mg/dL      Peak postprandial:   less than 180 mg/dL (1-2 hours)      Critically ill patients:  140 - 180 mg/dL   Lab Results  Component Value Date   GLUCAP 200 (H) 09/01/2022   HGBA1C 9.3 (H) 08/31/2022    Review of Glycemic Control  Latest Reference Range & Units 08/31/22 12:02 08/31/22 16:53 08/31/22 20:48 09/01/22 08:11 09/01/22 08:23 09/01/22 08:40  Glucose-Capillary 70 - 99 mg/dL 148 (H) 129 (H) 203 (H) 48 (L) 51 (L) 200 (H)   Diabetes history: DM1 Outpatient Diabetes medications:  Levemir 32 units BID Insulin Carb ratio--1 units:3 carbs Insulin correction factor--1 unit for every 50 > 150 A1C 9.6% on 08/10/22  Current orders for Inpatient glycemic control:  Levemir 6 units bid Novolog sensitive tid with meals Inpatient Diabetes Program Recommendations:    Note low fasting blood sugar this morning.  Patient's insulin needs appear to be much lower then her home needs.  Recommend reducing Levemir in the evening to 4 units q HS and keep Levemir 6 units in the mornings.  Also may want to add 3AM blood sugar check for safety.    Will follow.   Thanks,  Adah Perl, RN, BC-ADM Inpatient Diabetes Coordinator Pager [phone]  (8a-5p)

## 2022-09-02 DIAGNOSIS — E1065 Type 1 diabetes mellitus with hyperglycemia: Secondary | ICD-10-CM | POA: Diagnosis not present

## 2022-09-02 DIAGNOSIS — E1010 Type 1 diabetes mellitus with ketoacidosis without coma: Secondary | ICD-10-CM

## 2022-09-02 DIAGNOSIS — J189 Pneumonia, unspecified organism: Secondary | ICD-10-CM | POA: Diagnosis not present

## 2022-09-02 LAB — GLUCOSE, CAPILLARY
Glucose-Capillary: 80 mg/dL (ref 70–99)
Glucose-Capillary: 64 mg/dL — ABNORMAL LOW (ref 70–99)
Glucose-Capillary: 226 mg/dL — ABNORMAL HIGH (ref 70–99)
Glucose-Capillary: 233 mg/dL — ABNORMAL HIGH (ref 70–99)
Glucose-Capillary: 318 mg/dL — ABNORMAL HIGH (ref 70–99)
Glucose-Capillary: 224 mg/dL — ABNORMAL HIGH (ref 70–99)

## 2022-09-02 LAB — PROCALCITONIN: Procalcitonin: 0.2 ng/mL

## 2022-09-02 LAB — CULTURE, BLOOD (SINGLE)
Culture: NO GROWTH
Culture: NO GROWTH
Special Requests: ADEQUATE

## 2022-09-02 LAB — CULTURE, BLOOD (ROUTINE X 2): Special Requests: ADEQUATE

## 2022-09-02 LAB — BASIC METABOLIC PANEL
Anion gap: 9 (ref 5–15)
BUN: 11 mg/dL (ref 6–20)
CO2: 26 mmol/L (ref 22–32)
Calcium: 8.7 mg/dL — ABNORMAL LOW (ref 8.9–10.3)
Chloride: 103 mmol/L (ref 98–111)
Creatinine, Ser: 1.03 mg/dL — ABNORMAL HIGH (ref 0.44–1.00)
GFR, Estimated: >60 mL/min (ref >60)
Glucose, Bld: 99 mg/dL (ref 70–99)
Potassium: 3 mmol/L — ABNORMAL LOW (ref 3.5–5.1)
Sodium: 138 mmol/L (ref 135–145)

## 2022-09-02 LAB — BRAIN NATRIURETIC PEPTIDE: B Natriuretic Peptide: 142.9 pg/mL — ABNORMAL HIGH (ref 0.0–100.0)

## 2022-09-02 LAB — MAGNESIUM: Magnesium: 1.7 mg/dL (ref 1.7–2.4)

## 2022-09-02 MED ORDER — POTASSIUM CHLORIDE CRYS ER 20 MEQ PO TBCR: 40.0000 meq | EXTENDED_RELEASE_TABLET | ORAL | Status: DC

## 2022-09-02 MED ORDER — POTASSIUM CHLORIDE 10 MEQ/100ML IV SOLN
10.0000 meq | INTRAVENOUS | Status: AC
  Administered 2022-09-02 (×2): 10 meq via INTRAVENOUS
  Filled 2022-09-02 (×2): qty 100

## 2022-09-02 MED ORDER — INSULIN DETEMIR 100 UNIT/ML ~~LOC~~ SOLN
8.0000 [IU] | Freq: Every day | SUBCUTANEOUS | Status: DC
  Administered 2022-09-02: 8 [IU] via SUBCUTANEOUS
  Filled 2022-09-02 (×2): qty 0.08

## 2022-09-02 MED ORDER — MAGNESIUM SULFATE 2 GM/50ML IV SOLN
2.0000 g | Freq: Once | INTRAVENOUS | Status: AC
  Administered 2022-09-02: 2 g via INTRAVENOUS
  Filled 2022-09-02: qty 50

## 2022-09-02 MED ORDER — INSULIN ASPART 100 UNIT/ML IJ SOLN
0.0000 [IU] | Freq: Three times a day (TID) | INTRAMUSCULAR | Status: DC
  Administered 2022-09-02: 7 [IU] via SUBCUTANEOUS
  Administered 2022-09-03: 5 [IU] via SUBCUTANEOUS
  Filled 2022-09-02 (×2): qty 1

## 2022-09-02 MED ORDER — POTASSIUM CHLORIDE CRYS ER 20 MEQ PO TBCR
40.0000 meq | EXTENDED_RELEASE_TABLET | ORAL | Status: AC
  Administered 2022-09-02 (×2): 40 meq via ORAL
  Filled 2022-09-02 (×2): qty 2

## 2022-09-02 NOTE — Inpatient Diabetes Management (Signed)
Inpatient Diabetes Program Recommendations  AACE/ADA: New Consensus Statement on Inpatient Glycemic Control (2015)  Target Ranges:  Prepandial:   less than 140 mg/dL      Peak postprandial:   less than 180 mg/dL (1-2 hours)      Critically ill patients:  140 - 180 mg/dL   Lab Results  Component Value Date   GLUCAP 233 (H) 09/02/2022   HGBA1C 9.3 (H) 08/31/2022    Latest Reference Range & Units 09/01/22 21:47 09/01/22 23:20 09/02/22 05:30 09/02/22 07:23 09/02/22 08:59  Glucose-Capillary 70 - 99 mg/dL 389 (H) 346 (H) 80 64 (L) 226 (H)  (H): Data is abnormally high (L): Data is abnormally low  Diabetes history: DM1 Outpatient Diabetes medications:  Levemir 32 units BID Insulin Carb ratio--1 units:3 carbs Insulin correction factor--1 unit for every 50 > 150 A1C 9.6% on 08/10/22  Current orders for Inpatient glycemic control:  Levemir 6 units bid Novolog sensitive tid with meals hs 0-9 units  Inpatient Diabetes Program Recommendations:   Patient had hypoglycemia this am of 64-received Novolog 7 units hs correction Please consider: -Decrease hs Novolog correction to 0-5 units -Add Novolog 3 units tid meal coverage if postprandials > 180  Thank you, Bethena Roys E. Latecia Miler, RN, MSN, CDE  Diabetes Coordinator Inpatient Glycemic Control Team Team Pager [phone] (8am-5pm) 09/02/2022 11:54 AM

## 2022-09-02 NOTE — Progress Notes (Signed)
End of shift note:  MD was notified about low blood glucose this morning. Changes were made to her insuline regimen. IV/PO K/Mg were given. Schedule medications were given.

## 2022-09-02 NOTE — Progress Notes (Signed)
Progress Note   Patient: [name]:[id number] DOB: Nov 21, 1995 DOA: 08/28/2022     5 DOS: the patient was seen and examined on 09/02/2022   Brief hospital course: 26 year old female with past medical history of diabetes mellitus type 1 brought in on 10/29 after family could not reach her during the day previous.  When they found her, patient looked to be confused and agitated.  CBGs markedly elevated.  Lab work revealed severe DKA with pH of 6.98.  Labs noted questionable pneumonia and elevated procalcitonin.  Patient started on IV fluids and insulin drip.  Initially, she was so volume depleted she required pressor support.  Following treatment and improvement, able to wean off of insulin drip and pressors and fluids.  Mentation normalized.  Transferred to hospitalist service on 11/1.  Patient initially had received 1 dose of antibiotics in the emergency room and then antibiotics were resumed on 11/1.   Assessment and Plan: * DKA (diabetic ketoacidosis) (HCC)-resolved as of 08/31/2022 Uncontrolled type 1 diabetes mellitus with hyperglycemia, with long-term current use of insulin (HCC) Intermittent hypoglycemia. A1c of 9.3, up from several years ago.  Patient endorses good compliance and controlled CBGs up until this past year when her father passed away.  Since then, she has not been taking her insulin consistently.   Patient has a.m. hypoglycemia even on lower dose of Levemir.  Her appetite seems to be improving, I will increase daytime Levemir to 8 units, continue evening dose at 4 units. Plan to discharge home tomorrow if hypoglycemia is resolved.  Chronic diastolic CHF (congestive heart failure) (HCC) Noted to have elevated blood pressures and some of her cough could be from CHF.  BNP mildly elevated.  Echocardiogram done on 11/1 notes grade 2 diastolic dysfunction,  Patient was given IV furosemide, volume status much better.  She had a mild volume overload overload from aggressive IV  fluids.  CAP (community acquired pneumonia) Suspected pneumonia.  Procalcitonin of 3 on admission and slowly been improving.  Continue total of antibiotics x5 days.  Acute renal failure (HCC)-resolved as of 08/31/2022 Secondary to DKA and volume depletion.  Resolved with IV fluids.  Lactic acidosis-resolved as of 08/31/2022 Secondary to intravascular volume depletion from DKA.  This is not sepsis, sepsis ruled out  Overweight (BMI 25.0-29.9) Meets criteria with BMI greater than 25       Subjective:  Patient doing better today, currently she did not have any symptoms this morning when her glucose dropped down to 64.  Physical Exam: Vitals:   09/01/22 1521 09/01/22 1948 09/02/22 0523 09/02/22 0724  BP: (!) 142/101 (!) 138/104 (!) 139/91 (!) 147/90  Pulse: 68 62 (!) 54 61  Resp: 18 16 16 18   Temp: 98.1 F (36.7 C) 98.5 F (36.9 C) 98.4 F (36.9 C) 98.7 F (37.1 C)  TempSrc:  Oral Oral Oral  SpO2: 98% 99% 99% 100%  Weight:   70.6 kg    General exam: Appears calm and comfortable  Respiratory system: Clear to auscultation. Respiratory effort normal. Cardiovascular system: S1 & S2 heard, RRR. No JVD, murmurs, rubs, gallops or clicks. No pedal edema. Gastrointestinal system: Abdomen is nondistended, soft and nontender. No organomegaly or masses felt. Normal bowel sounds heard. Central nervous system: Alert and oriented. No focal neurological deficits. Extremities: Symmetric 5 x 5 power. Skin: No rashes, lesions or ulcers Psychiatry: Judgement and insight appear normal. Mood & affect appropriate.   Data Reviewed:  Lab results reviewed.  Family Communication: none  Disposition: Status  is: Inpatient Remains inpatient appropriate because: Severity of disease.  Planned Discharge Destination: Home    Time spent: 35 minutes  Author: Sharen Hones, MD 09/02/2022 11:07 AM  For on call review [email or web address].

## 2022-09-02 NOTE — Progress Notes (Signed)
Provider Notification:  Sharen Hones   I just want to let you know pt's BG was 64 this morning but pt is alert and oriented. Apple juice was given. Blood glucose will be check in 1 hr.

## 2022-09-02 NOTE — Progress Notes (Signed)
Date and time results received: 09/02/22 0729 (use smartphrase ".now" to insert current time)  Test: BG Critical Value: 49  Name of Provider Notified: Sharen Hones MD   Orders Received? Or Actions Taken?: Actions Taken: RN gave apple juice and BG will be check in 1 hr

## 2022-09-03 DIAGNOSIS — N170 Acute kidney failure with tubular necrosis: Secondary | ICD-10-CM

## 2022-09-03 DIAGNOSIS — E1065 Type 1 diabetes mellitus with hyperglycemia: Secondary | ICD-10-CM | POA: Diagnosis not present

## 2022-09-03 DIAGNOSIS — I5032 Chronic diastolic (congestive) heart failure: Secondary | ICD-10-CM | POA: Diagnosis not present

## 2022-09-03 DIAGNOSIS — J189 Pneumonia, unspecified organism: Secondary | ICD-10-CM | POA: Diagnosis not present

## 2022-09-03 LAB — GLUCOSE, CAPILLARY
Glucose-Capillary: 249 mg/dL — ABNORMAL HIGH (ref 70–99)
Glucose-Capillary: 254 mg/dL — ABNORMAL HIGH (ref 70–99)

## 2022-09-03 LAB — BASIC METABOLIC PANEL
Anion gap: 8 (ref 5–15)
BUN: 13 mg/dL (ref 6–20)
CO2: 22 mmol/L (ref 22–32)
Calcium: 8.7 mg/dL — ABNORMAL LOW (ref 8.9–10.3)
Chloride: 106 mmol/L (ref 98–111)
Creatinine, Ser: 0.89 mg/dL (ref 0.44–1.00)
GFR, Estimated: >60 mL/min (ref >60)
Glucose, Bld: 276 mg/dL — ABNORMAL HIGH (ref 70–99)
Potassium: 4.1 mmol/L (ref 3.5–5.1)
Sodium: 136 mmol/L (ref 135–145)

## 2022-09-03 LAB — MAGNESIUM: Magnesium: 2 mg/dL (ref 1.7–2.4)

## 2022-09-03 MED ORDER — INSULIN DETEMIR 100 UNIT/ML ~~LOC~~ SOLN
10.0000 [IU] | Freq: Two times a day (BID) | SUBCUTANEOUS | Status: DC
  Administered 2022-09-03: 10 [IU] via SUBCUTANEOUS
  Filled 2022-09-03 (×2): qty 0.1

## 2022-09-03 MED ORDER — LOSARTAN POTASSIUM 50 MG PO TABS
50.0000 mg | ORAL_TABLET | Freq: Every day | ORAL | Status: DC
  Administered 2022-09-03: 50 mg via ORAL
  Filled 2022-09-03: qty 1

## 2022-09-03 MED ORDER — AMOXICILLIN-POT CLAVULANATE 875-125 MG PO TABS: 1.0000 | ORAL_TABLET | Freq: Two times a day (BID) | ORAL | 0 refills | Status: AC

## 2022-09-03 NOTE — Discharge Instructions (Signed)
Follow-up with your endocrinologist in 1 to 2 weeks.

## 2022-09-03 NOTE — Discharge Summary (Signed)
Physician Discharge Summary   Patient: [name] MRN: [id number] DOB: 08-Dec-1995  Admit date:     08/28/2022  Discharge date: 09/03/22  Discharge Physician: Sharen Hones   PCP: Pcp, No   Recommendations at discharge:   Follow-up with the endocrinologist in 1 to 2 weeks.  Discharge Diagnoses: Active Problems:   Uncontrolled type 1 diabetes mellitus with hyperglycemia, with long-term current use of insulin (HCC)   Chronic diastolic CHF (congestive heart failure) ([place])   CAP (community acquired pneumonia)   Overweight (BMI 25.0-29.9)  Principal Problem (Resolved):   DKA (diabetic ketoacidosis) ([place]) Resolved Problems:   Acute metabolic encephalopathy   Acute renal failure (HCC)   Lactic acidosis Hypoglycemia secondary to poor p.o. intake in the setting of type 1 diabetes. Hyponatremia. Hypophosphatemia. Hypomagnesemia. Hypokalemia. Reactive thrombocytosis. Hospital Course: 26 year old female with past medical history of diabetes mellitus type 1 brought in on 10/29 after family could not reach her during the day previous.  When they found her, patient looked to be confused and agitated.  CBGs markedly elevated.  Lab work revealed severe DKA with pH of 6.98.  Labs noted questionable pneumonia and elevated procalcitonin.  Patient started on IV fluids and insulin drip.  Initially, she was so volume depleted she required pressor support.  Following treatment and improvement, able to wean off of insulin drip and pressors and fluids.  Mentation normalized.  Transferred to hospitalist service on 11/1.  Patient initially had received 1 dose of antibiotics in the emergency room and then antibiotics were resumed on 11/1.   Assessment and Plan: DKA (diabetic ketoacidosis) (HCC)-resolved as of 08/31/2022 Uncontrolled type 1 diabetes mellitus with hyperglycemia, with long-term current use of insulin (HCC) Intermittent hypoglycemia. A1c of 9.3, up from several years ago.  Patient endorses good  compliance and controlled CBGs up until this past year when her father passed away.  Since then, she has not been taking her insulin consistently.   Patient had a.m. hypoglycemia even on lower dose of Levemir.  This appears to be secondary to poor p.o. intake with poor appetite. Her appetite since has improved since yesterday, she no longer has a.m. hypoglycemia.  At this point, I will resume home dose of Levemir.  Patient be followed up with her endocrinologist in 1 to 2 weeks.   Chronic diastolic CHF (congestive heart failure) (HCC) Noted to have elevated blood pressures and some of her cough could be from CHF.  BNP mildly elevated.  Echocardiogram done on 11/1 notes grade 2 diastolic dysfunction,  Patient was given IV furosemide, volume status much better.  She had a mild volume overload overload from aggressive IV fluids. Condition improved, patient will be referred to cardiology as outpatient.  CAP (community acquired pneumonia) Suspected pneumonia.  Procalcitonin of 3 on admission and slowly been improving.  Continue total of antibiotics x5 days.   Acute renal failure (HCC)-resolved as of 08/31/2022 Secondary to DKA and volume depletion.  Resolved with IV fluids.   Lactic acidosis-resolved as of 08/31/2022 Secondary to intravascular volume depletion from DKA.  This is not sepsis, sepsis ruled out   Overweight (BMI 25.0-29.9) Meets criteria with BMI greater than 25       Consultants: None Procedures performed: None  Disposition: Home Diet recommendation:  Discharge Diet Orders (From admission, onward)     Start     Ordered   09/03/22 0000  Diet Carb Modified        09/03/22 1009  Carb modified diet DISCHARGE MEDICATION: Allergies as of 09/03/2022       Reactions   Sulfamethoxazole-trimethoprim Hives   Pravastatin Other (See Comments)        Medication List     TAKE these medications    amoxicillin-clavulanate 875-125 MG tablet Commonly known as:  AUGMENTIN Take 1 tablet by mouth every 12 (twelve) hours for 3 doses.   cholecalciferol 25 MCG (1000 UNIT) tablet Commonly known as: VITAMIN D3 Take 1,000 Units by mouth daily.   FreeStyle Libre 2 Reader [place] Use to monitor blood glucose.   insulin aspart 100 UNIT/ML injection Commonly known as: novoLOG Inject 2 Units into the skin 3 (three) times daily with meals.   insulin detemir 100 UNIT/ML injection Commonly known as: LEVEMIR Inject 0.12 mLs (12 Units total) into the skin 2 (two) times daily. What changed:  how much to take when to take this   levonorgestrel 20 MCG/24HR IUD Commonly known as: MIRENA 1 each by Intrauterine route once.   losartan 100 MG tablet Commonly known as: COZAAR Take 100 mg by mouth daily.   pantoprazole 40 MG tablet Commonly known as: PROTONIX Take 1 tablet (40 mg total) by mouth daily.        Discharge Exam: Filed Weights   08/28/22 0420 08/29/22 1200 09/02/22 0523  Weight: 67.4 kg 73 kg 70.6 kg   General exam: Appears calm and comfortable  Respiratory system: Clear to auscultation. Respiratory effort normal. Cardiovascular system: S1 & S2 heard, RRR. No JVD, murmurs, rubs, gallops or clicks. No pedal edema. Gastrointestinal system: Abdomen is nondistended, soft and nontender. No organomegaly or masses felt. Normal bowel sounds heard. Central nervous system: Alert and oriented. No focal neurological deficits. Extremities: Symmetric 5 x 5 power. Skin: No rashes, lesions or ulcers Psychiatry: Judgement and insight appear normal. Mood & affect appropriate.    Condition at discharge: good  The results of significant diagnostics from this hospitalization (including imaging, microbiology, ancillary and laboratory) are listed below for reference.   Imaging Studies: ECHOCARDIOGRAM COMPLETE  Result Date: 08/30/2022    ECHOCARDIOGRAM REPORT   Patient Name:   [name] Date of Exam: 08/30/2022 Medical Rec #:  [id number]    Height:        66.0 in Accession #:    [id number]   Weight:       160.9 lb Date of Birth:  06/21/1996    BSA:          1.823 m Patient Age:    26 years     BP:           121/79 mmHg Patient Gender: F            HR:           81 bpm. Exam Location:  [facility] Procedure: 2D Echo, Color Doppler and Cardiac Doppler Indications:     Elevated troponin  History:         Patient has no prior history of Echocardiogram examinations.                  Risk Factors:Hypertension and Diabetes.  Sonographer:     Charmayne Sheer Referring Phys:  [id number] BRITTON L R[place] Diagnosing Phys: Yolonda Kida MD  Sonographer Comments: Suboptimal parasternal window. IMPRESSIONS  1. Left ventricular ejection fraction, by estimation, is 60 to 65%. The left ventricle has normal function. The left ventricle has no regional wall motion abnormalities. Left ventricular diastolic parameters are consistent with Grade II diastolic  dysfunction (pseudonormalization).  2. Right ventricular systolic function is normal. The right ventricular size is normal.  3. The mitral valve is normal in structure. No evidence of mitral valve regurgitation.  4. The aortic valve is normal in structure. Aortic valve regurgitation is not visualized. FINDINGS  Left Ventricle: Left ventricular ejection fraction, by estimation, is 60 to 65%. The left ventricle has normal function. The left ventricle has no regional wall motion abnormalities. The left ventricular internal cavity size was normal in size. There is  borderline left ventricular hypertrophy. Left ventricular diastolic parameters are consistent with Grade II diastolic dysfunction (pseudonormalization). Right Ventricle: The right ventricular size is normal. No increase in right ventricular wall thickness. Right ventricular systolic function is normal. Left Atrium: Left atrial size was normal in size. Right Atrium: Right atrial size was normal in size. Pericardium: There is no evidence of pericardial effusion. Mitral Valve: The  mitral valve is normal in structure. No evidence of mitral valve regurgitation. Tricuspid Valve: The tricuspid valve is normal in structure. Tricuspid valve regurgitation is trivial. Aortic Valve: The aortic valve is normal in structure. Aortic valve regurgitation is not visualized. Aortic valve mean gradient measures 3.0 mmHg. Aortic valve peak gradient measures 5.1 mmHg. Aortic valve area, by VTI measures 2.55 cm. Pulmonic Valve: The pulmonic valve was normal in structure. Pulmonic valve regurgitation is not visualized. Aorta: The ascending aorta was not well visualized. IAS/Shunts: No atrial level shunt detected by color flow Doppler.  LEFT VENTRICLE PLAX 2D LVIDd:         3.60 cm   Diastology LVIDs:         2.40 cm   LV e' medial:    9.46 cm/s LV PW:         1.20 cm   LV E/e' medial:  10.7 LV IVS:        0.70 cm   LV e' lateral:   13.70 cm/s LVOT diam:     1.90 cm   LV E/e' lateral: 7.4 LV SV:         56 LV SV Index:   31 LVOT Area:     2.84 cm  RIGHT VENTRICLE RV Basal diam:  3.40 cm RV S prime:     11.50 cm/s LEFT ATRIUM             Index        RIGHT ATRIUM          Index LA diam:        3.80 cm 2.08 cm/m   RA Area:     7.32 cm LA Vol (A2C):   26.3 ml 14.42 ml/m  RA Volume:   12.20 ml 6.69 ml/m LA Vol (A4C):   22.6 ml 12.39 ml/m LA Biplane Vol: 25.9 ml 14.20 ml/m  AORTIC VALVE                    PULMONIC VALVE AV Area (Vmax):    2.63 cm     PV Vmax:       0.86 m/s AV Area (Vmean):   2.51 cm     PV Vmean:      57.300 cm/s AV Area (VTI):     2.55 cm     PV VTI:        0.152 m AV Vmax:           113.00 cm/s  PV Peak grad:  3.0 mmHg AV Vmean:  77.000 cm/s  PV Mean grad:  2.0 mmHg AV VTI:            0.221 m AV Peak Grad:      5.1 mmHg AV Mean Grad:      3.0 mmHg LVOT Vmax:         105.00 cm/s LVOT Vmean:        68.100 cm/s LVOT VTI:          0.199 m LVOT/AV VTI ratio: 0.90  AORTA Ao Root diam: 2.80 cm MITRAL VALVE MV Area (PHT): 3.93 cm     SHUNTS MV Decel Time: 193 msec     Systemic VTI:  0.20  m MV E velocity: 101.00 cm/s  Systemic Diam: 1.90 cm MV A velocity: 60.80 cm/s MV E/A ratio:  1.66 Dwayne D Callwood MD Electronically signed by Alwyn Pea MD Signature Date/Time: 08/30/2022/4:50:01 PM    Final    DG Chest Port 1 View  Result Date: 08/30/2022 CLINICAL DATA:  Shortness of breath EXAM: PORTABLE CHEST 1 VIEW COMPARISON:  08/28/2022 FINDINGS: Heart is normal size. Patchy left mid and lower lung airspace opacities and right perihilar opacities, new since prior study. No effusions or pneumothorax. No acute bony abnormality. IMPRESSION: Patchy bilateral airspace disease, left greater than right could reflect pneumonia. Electronically Signed   By: Charlett Nose M.D.   On: 08/30/2022 02:55   DG Chest Port 1 View  Result Date: 08/28/2022 CLINICAL DATA:  Sepsis EXAM: PORTABLE CHEST 1 VIEW COMPARISON:  11/05/2019 FINDINGS: The heart size and mediastinal contours are within normal limits. Both lungs are clear. The visualized skeletal structures are unremarkable. IMPRESSION: No active disease. Electronically Signed   By: Helyn Numbers M.D.   On: 08/28/2022 02:44    Microbiology: Results for orders placed or performed during the hospital encounter of 08/28/22  Culture, blood (single) w Reflex to ID Panel     Status: None   Collection Time: 08/28/22  2:11 AM   Specimen: BLOOD  Result Value Ref Range Status   Specimen Description BLOOD BLOOD LEFT ARM  Final   Special Requests   Final    BOTTLES DRAWN AEROBIC AND ANAEROBIC Blood Culture results may not be optimal due to an inadequate volume of blood received in culture bottles   Culture   Final    NO GROWTH 5 DAYS Performed at [facility], [address], [place], [place] [address]    Report Status 09/02/2022 FINAL  Final  Urine Culture     Status: None   Collection Time: 08/28/22  2:12 AM   Specimen: In/Out Cath Urine  Result Value Ref Range Status   Specimen Description   Final    IN/OUT CATH URINE Performed at  [facility], [address], [place], [place] [address]    Special Requests   Final    NONE Performed at [facility], [address] [place], [place] [address]    Culture   Final    NO GROWTH Performed at [facility], [address] [place], [place] [address]    Report Status 08/29/2022 FINAL  Final  SARS Coronavirus 2 by RT PCR (hospital order, performed in [facility] hospital lab) *cepheid single result test* Anterior Nasal Swab     Status: None   Collection Time: 08/28/22  2:12 AM   Specimen: Anterior Nasal Swab  Result Value Ref Range Status   SARS Coronavirus 2 by RT PCR NEGATIVE NEGATIVE Final  Comment: (NOTE) SARS-CoV-2 target nucleic acids are NOT DETECTED.  The SARS-CoV-2 RNA is generally detectable in upper and lower respiratory specimens during the acute phase of infection. The lowest concentration of SARS-CoV-2 viral copies this assay can detect is 250 copies / mL. A negative result does not preclude SARS-CoV-2 infection and should not be used as the sole basis for treatment or other patient management decisions.  A negative result may occur with improper specimen collection / handling, submission of specimen other than nasopharyngeal swab, presence of viral mutation(s) within the areas targeted by this assay, and inadequate number of viral copies (<250 copies / mL). A negative result must be combined with clinical observations, patient history, and epidemiological information.  Fact Sheet for Patients:   https://www.patel.info/  Fact Sheet for Healthcare Providers: https://hall.com/  This test is not yet approved or  cleared by the [place] FDA and has been authorized for detection and/or diagnosis of SARS-CoV-2 by FDA under an Emergency Use Authorization (EUA).  This EUA will remain in effect (meaning this test can be used) for the duration of the COVID-19 declaration under Section  564(b)(1) of the Act, 21 U.S.C. section 360bbb-3(b)(1), unless the authorization is terminated or revoked sooner.  Performed at [facility], [address] [place], [place] [address]   MRSA Next Gen by PCR, Nasal     Status: None   Collection Time: 08/28/22  4:00 AM   Specimen: Nasal Mucosa; Nasal Swab  Result Value Ref Range Status   MRSA by PCR Next Gen NOT DETECTED NOT DETECTED Final    Comment: (NOTE) The GeneXpert MRSA Assay (FDA approved for NASAL specimens only), is one component of a comprehensive MRSA colonization surveillance program. It is not intended to diagnose MRSA infection nor to guide or monitor treatment for MRSA infections. Test performance is not FDA approved in patients less than 82 years old. Performed at [facility], [address] [place], [place] [address]   Culture, blood (single) w Reflex to ID Panel     Status: None   Collection Time: 08/28/22  9:00 AM   Specimen: BLOOD  Result Value Ref Range Status   Specimen Description BLOOD BLOOD LEFT HAND  Final   Special Requests   Final    BOTTLES DRAWN AEROBIC AND ANAEROBIC Blood Culture adequate volume   Culture   Final    NO GROWTH 5 DAYS Performed at [facility], [address], [place], [place] [address]    Report Status 09/02/2022 FINAL  Final  Culture, blood (Routine X 2) w Reflex to ID Panel     Status: Abnormal   Collection Time: 08/30/22  2:29 AM   Specimen: BLOOD RIGHT HAND  Result Value Ref Range Status   Specimen Description   Final    BLOOD RIGHT HAND Performed at [facility], [address], [place], [place] [address]    Special Requests   Final    BOTTLES DRAWN AEROBIC AND ANAEROBIC Blood Culture adequate volume Performed at [facility], [address] [place],  [address]    Culture  Setup Time   Final    GRAM POSITIVE COCCI IN BOTH AEROBIC AND ANAEROBIC BOTTLES CRITICAL RESULT CALLED TO, READ BACK BY AND VERIFIED  WITH: CAROLINE COULTER ON 08/30/22 AT 1920 RP    Culture (A)  Final    STAPHYLOCOCCUS HOMINIS THE SIGNIFICANCE OF ISOLATING THIS ORGANISM FROM A SINGLE SET OF BLOOD CULTURES WHEN MULTIPLE SETS ARE DRAWN IS UNCERTAIN. PLEASE  NOTIFY THE MICROBIOLOGY DEPARTMENT WITHIN ONE WEEK IF SPECIATION AND SENSITIVITIES ARE REQUIRED. Performed at [facility], [address] [place], [place] [address]    Report Status 09/02/2022 FINAL  Final  Blood Culture ID Panel (Reflexed)     Status: Abnormal   Collection Time: 08/30/22  2:29 AM  Result Value Ref Range Status   Enterococcus faecalis NOT DETECTED NOT DETECTED Final   Enterococcus Faecium NOT DETECTED NOT DETECTED Final   Listeria monocytogenes NOT DETECTED NOT DETECTED Final   Staphylococcus species DETECTED (A) NOT DETECTED Final    Comment: CRITICAL RESULT CALLED TO, READ BACK BY AND VERIFIED WITH: CAROLINE COULTER ON 08/30/22 AT 1920 RP    Staphylococcus aureus (BCID) NOT DETECTED NOT DETECTED Final   Staphylococcus epidermidis NOT DETECTED NOT DETECTED Final   Staphylococcus lugdunensis NOT DETECTED NOT DETECTED Final   Streptococcus species NOT DETECTED NOT DETECTED Final   Streptococcus agalactiae NOT DETECTED NOT DETECTED Final   Streptococcus pneumoniae NOT DETECTED NOT DETECTED Final   Streptococcus pyogenes NOT DETECTED NOT DETECTED Final   A.calcoaceticus-baumannii NOT DETECTED NOT DETECTED Final   Bacteroides fragilis NOT DETECTED NOT DETECTED Final   Enterobacterales NOT DETECTED NOT DETECTED Final   Enterobacter cloacae complex NOT DETECTED NOT DETECTED Final   Escherichia coli NOT DETECTED NOT DETECTED Final   Klebsiella aerogenes NOT DETECTED NOT DETECTED Final   Klebsiella oxytoca NOT DETECTED NOT DETECTED Final   Klebsiella pneumoniae NOT DETECTED NOT DETECTED Final   Proteus species NOT DETECTED NOT DETECTED Final   Salmonella species NOT DETECTED NOT DETECTED Final   Serratia marcescens NOT DETECTED NOT DETECTED Final    Haemophilus influenzae NOT DETECTED NOT DETECTED Final   Neisseria meningitidis NOT DETECTED NOT DETECTED Final   Pseudomonas aeruginosa NOT DETECTED NOT DETECTED Final   Stenotrophomonas maltophilia NOT DETECTED NOT DETECTED Final   Candida albicans NOT DETECTED NOT DETECTED Final   Candida auris NOT DETECTED NOT DETECTED Final   Candida glabrata NOT DETECTED NOT DETECTED Final   Candida krusei NOT DETECTED NOT DETECTED Final   Candida parapsilosis NOT DETECTED NOT DETECTED Final   Candida tropicalis NOT DETECTED NOT DETECTED Final   Cryptococcus neoformans/gattii NOT DETECTED NOT DETECTED Final    Comment: Performed at [facility], [address] [place], [place] [address]  Culture, blood (Routine X 2) w Reflex to ID Panel     Status: None (Preliminary result)   Collection Time: 08/30/22  9:10 PM   Specimen: BLOOD  Result Value Ref Range Status   Specimen Description BLOOD RIGHT AC  Final   Special Requests   Final    BOTTLES DRAWN AEROBIC AND ANAEROBIC Blood Culture results may not be optimal due to an excessive volume of blood received in culture bottles   Culture   Final    NO GROWTH 4 DAYS Performed at [facility], [address] [place], [place] [address]    Report Status PENDING  Incomplete  Culture, blood (Routine X 2) w Reflex to ID Panel     Status: None (Preliminary result)   Collection Time: 08/30/22  9:25 PM   Specimen: BLOOD RIGHT HAND  Result Value Ref Range Status   Specimen Description BLOOD RIGHT HAND  Final   Special Requests   Final    BOTTLES DRAWN AEROBIC AND ANAEROBIC Blood Culture adequate volume   Culture   Final    NO GROWTH 4 DAYS Performed at [facility], [address] [place], [place]  [address]    Report Status PENDING  Incomplete    Labs: CBC: Recent Labs  Lab 08/28/22 0156 08/28/22 0544 08/29/22 0429 08/30/22 0229  WBC 23.6* 17.3* 14.7* 9.5  NEUTROABS 18.8*  --   --   --   HGB 13.0 9.8* 10.1* 9.9*   HCT 41.3 30.7* 29.2* 29.5*  MCV 103.0* 103.4* 95.4 95.2  PLT 474* 186 186 0000000   Basic Metabolic Panel: Recent Labs  Lab 08/28/22 0544 08/28/22 0945 08/28/22 2015 08/29/22 0429 08/29/22 1754 08/30/22 0229 08/31/22 0544 09/01/22 0450 09/02/22 0423 09/02/22 0425 09/03/22 0354  NA 142   < > 144 139  --  134* 139 140  --  138 136  K 4.9   < > 3.2* 3.3* 4.2 3.7 4.0 3.2*  --  3.0* 4.1  CL 103   < > 105 103  --  104 110 109  --  103 106  CO2 13*   < > 31 32  --  27 22 24   --  26 22  GLUCOSE 461*   < > 144* 164*  --  110* 204* 58*  --  99 276*  BUN 33*   < > 26* 23*  --  14 13 8   --  11 13  CREATININE 2.31*   < > 1.27* 1.15*  --  1.04* 1.03* 0.92  --  1.03* 0.89  CALCIUM 8.0*   < > 7.7* 7.3*  --  8.0* 8.1* 8.4*  --  8.7* 8.7*  MG 1.9  --  1.5* 2.3  --  2.2  --   --  1.7  --  2.0  PHOS 4.0  --  1.4* 4.0 2.7 2.6  --   --   --   --   --    < > = values in this interval not displayed.   Liver Function Tests: Recent Labs  Lab 08/28/22 0156  AST 52*  ALT 32  ALKPHOS 98  BILITOT 2.1*  PROT 7.6  ALBUMIN 4.1   CBG: Recent Labs  Lab 09/02/22 1122 09/02/22 1625 09/02/22 2147 09/03/22 0205 09/03/22 0747  GLUCAP 233* 318* 224* 249* 254*    Discharge time spent: greater than 30 minutes.  Signed: Sharen Hones, MD [facility] 09/03/2022

## 2022-09-04 LAB — CULTURE, BLOOD (ROUTINE X 2)
Culture: NO GROWTH
Culture: NO GROWTH
Special Requests: ADEQUATE

## 2022-10-15 ENCOUNTER — Other Ambulatory Visit: Payer: Self-pay

## 2022-10-15 ENCOUNTER — Inpatient Hospital Stay
Admission: EM | Admit: 2022-10-15 | Discharge: 2022-10-17 | DRG: 638 | Disposition: A | Discharge status: Home / Self Care | Payer: Medicaid Other | Attending: Internal Medicine | Admitting: Internal Medicine

## 2022-10-15 ENCOUNTER — Emergency Department: Payer: Medicaid Other

## 2022-10-15 DIAGNOSIS — Z91199 Patient's noncompliance with other medical treatment and regimen due to unspecified reason: Secondary | ICD-10-CM

## 2022-10-15 DIAGNOSIS — N179 Acute kidney failure, unspecified: Secondary | ICD-10-CM | POA: Diagnosis present

## 2022-10-15 DIAGNOSIS — R197 Diarrhea, unspecified: Secondary | ICD-10-CM | POA: Diagnosis present

## 2022-10-15 DIAGNOSIS — Z833 Family history of diabetes mellitus: Secondary | ICD-10-CM

## 2022-10-15 DIAGNOSIS — I110 Hypertensive heart disease with heart failure: Secondary | ICD-10-CM | POA: Diagnosis present

## 2022-10-15 DIAGNOSIS — I5032 Chronic diastolic (congestive) heart failure: Secondary | ICD-10-CM | POA: Diagnosis present

## 2022-10-15 DIAGNOSIS — Z1152 Encounter for screening for COVID-19: Secondary | ICD-10-CM | POA: Diagnosis not present

## 2022-10-15 DIAGNOSIS — Z8051 Family history of malignant neoplasm of kidney: Secondary | ICD-10-CM

## 2022-10-15 DIAGNOSIS — I5030 Unspecified diastolic (congestive) heart failure: Secondary | ICD-10-CM | POA: Diagnosis present

## 2022-10-15 DIAGNOSIS — Z794 Long term (current) use of insulin: Secondary | ICD-10-CM | POA: Diagnosis not present

## 2022-10-15 DIAGNOSIS — D72829 Elevated white blood cell count, unspecified: Secondary | ICD-10-CM | POA: Diagnosis present

## 2022-10-15 DIAGNOSIS — F411 Generalized anxiety disorder: Secondary | ICD-10-CM | POA: Diagnosis present

## 2022-10-15 DIAGNOSIS — E1010 Type 1 diabetes mellitus with ketoacidosis without coma: Secondary | ICD-10-CM | POA: Diagnosis present

## 2022-10-15 DIAGNOSIS — E875 Hyperkalemia: Secondary | ICD-10-CM | POA: Diagnosis present

## 2022-10-15 DIAGNOSIS — R000 Tachycardia, unspecified: Secondary | ICD-10-CM | POA: Diagnosis present

## 2022-10-15 DIAGNOSIS — E10649 Type 1 diabetes mellitus with hypoglycemia without coma: Secondary | ICD-10-CM | POA: Diagnosis not present

## 2022-10-15 LAB — URINALYSIS, ROUTINE W REFLEX MICROSCOPIC
Bilirubin Urine: NEGATIVE
Glucose, UA: >=500 mg/dL — AB
Hgb urine dipstick: NEGATIVE
Ketones, ur: 80 mg/dL — AB
Leukocytes,Ua: NEGATIVE
Nitrite: NEGATIVE
Protein, ur: NEGATIVE mg/dL
Specific Gravity, Urine: 1.018 (ref 1.005–1.030)
pH: 5 (ref 5.0–8.0)

## 2022-10-15 LAB — BLOOD GAS, VENOUS
Acid-base deficit: 17.1 mmol/L — ABNORMAL HIGH (ref 0.0–2.0)
Bicarbonate: 9.7 mmol/L — ABNORMAL LOW (ref 20.0–28.0)
O2 Saturation: 81 %
Patient temperature: 37
pCO2, Ven: 26 mmHg — ABNORMAL LOW (ref 44–60)
pH, Ven: 7.18 — CL (ref 7.25–7.43)
pO2, Ven: 61 mmHg — ABNORMAL HIGH (ref 32–45)

## 2022-10-15 LAB — CBC WITH DIFFERENTIAL/PLATELET
Abs Immature Granulocytes: 0.09 10*3/uL — ABNORMAL HIGH (ref 0.00–0.07)
Basophils Absolute: 0.1 10*3/uL (ref 0.0–0.1)
Basophils Relative: 0 %
Eosinophils Absolute: 0 10*3/uL (ref 0.0–0.5)
Eosinophils Relative: 0 %
HCT: 40.4 % (ref 36.0–46.0)
Hemoglobin: 12.9 g/dL (ref 12.0–15.0)
Immature Granulocytes: 1 %
Lymphocytes Relative: 11 %
Lymphs Abs: 2.1 10*3/uL (ref 0.7–4.0)
MCH: 32 pg (ref 26.0–34.0)
MCHC: 31.9 g/dL (ref 30.0–36.0)
MCV: 100.2 fL — ABNORMAL HIGH (ref 80.0–100.0)
Monocytes Absolute: 0.8 10*3/uL (ref 0.1–1.0)
Monocytes Relative: 4 %
Neutro Abs: 16.5 10*3/uL — ABNORMAL HIGH (ref 1.7–7.7)
Neutrophils Relative %: 84 %
Platelets: 393 10*3/uL (ref 150–400)
RBC: 4.03 MIL/uL (ref 3.87–5.11)
RDW: 12.4 % (ref 11.5–15.5)
WBC: 19.5 10*3/uL — ABNORMAL HIGH (ref 4.0–10.5)
nRBC: 0 % (ref 0.0–0.2)

## 2022-10-15 LAB — RESP PANEL BY RT-PCR (RSV, FLU A&B, COVID)  RVPGX2
Influenza A by PCR: NEGATIVE
Influenza B by PCR: NEGATIVE
Resp Syncytial Virus by PCR: NEGATIVE
SARS Coronavirus 2 by RT PCR: NEGATIVE

## 2022-10-15 LAB — COMPREHENSIVE METABOLIC PANEL
ALT: 28 U/L (ref 0–44)
AST: 38 U/L (ref 15–41)
Albumin: 4.7 g/dL (ref 3.5–5.0)
Alkaline Phosphatase: 80 U/L (ref 38–126)
Anion gap: 28 — ABNORMAL HIGH (ref 5–15)
BUN: 31 mg/dL — ABNORMAL HIGH (ref 6–20)
CO2: 9 mmol/L — ABNORMAL LOW (ref 22–32)
Calcium: 9.6 mg/dL (ref 8.9–10.3)
Chloride: 99 mmol/L (ref 98–111)
Creatinine, Ser: 1.54 mg/dL — ABNORMAL HIGH (ref 0.44–1.00)
GFR, Estimated: 47 mL/min — ABNORMAL LOW (ref >60)
Glucose, Bld: 573 mg/dL (ref 70–99)
Potassium: 5.4 mmol/L — ABNORMAL HIGH (ref 3.5–5.1)
Sodium: 136 mmol/L (ref 135–145)
Total Bilirubin: 1.7 mg/dL — ABNORMAL HIGH (ref 0.3–1.2)
Total Protein: 8.4 g/dL — ABNORMAL HIGH (ref 6.5–8.1)

## 2022-10-15 LAB — PREGNANCY, URINE: Preg Test, Ur: NEGATIVE

## 2022-10-15 LAB — BETA-HYDROXYBUTYRIC ACID: Beta-Hydroxybutyric Acid: 7.2 mmol/L — ABNORMAL HIGH (ref 0.05–0.27)

## 2022-10-15 LAB — CBG MONITORING, ED
Glucose-Capillary: 374 mg/dL — ABNORMAL HIGH (ref 70–99)
Glucose-Capillary: 502 mg/dL (ref 70–99)
Glucose-Capillary: 538 mg/dL (ref 70–99)

## 2022-10-15 LAB — LACTIC ACID, PLASMA
Lactic Acid, Venous: 6.1 mmol/L (ref 0.5–1.9)
Lactic Acid, Venous: 6 mmol/L (ref 0.5–1.9)

## 2022-10-15 MED ORDER — METOCLOPRAMIDE HCL 5 MG/ML IJ SOLN
10.0000 mg | Freq: Three times a day (TID) | INTRAMUSCULAR | Status: DC
  Administered 2022-10-16 (×2): 10 mg via INTRAVENOUS
  Filled 2022-10-15 (×2): qty 2

## 2022-10-15 MED ORDER — INSULIN REGULAR(HUMAN) IN NACL 100-0.9 UT/100ML-% IV SOLN
INTRAVENOUS | Status: DC
  Administered 2022-10-15: 7 [IU]/h via INTRAVENOUS
  Filled 2022-10-15: qty 100

## 2022-10-15 MED ORDER — ONDANSETRON HCL 4 MG/2ML IJ SOLN
4.0000 mg | Freq: Once | INTRAMUSCULAR | Status: AC
  Administered 2022-10-15: 4 mg via INTRAVENOUS
  Filled 2022-10-15: qty 2

## 2022-10-15 MED ORDER — LACTATED RINGERS IV SOLN: INTRAVENOUS | Status: DC

## 2022-10-15 MED ORDER — LACTATED RINGERS IV BOLUS
500.0000 mL | Freq: Once | INTRAVENOUS | Status: AC
  Administered 2022-10-15: 500 mL via INTRAVENOUS

## 2022-10-15 MED ORDER — DEXTROSE IN LACTATED RINGERS 5 % IV SOLN: INTRAVENOUS | Status: DC

## 2022-10-15 MED ORDER — ENOXAPARIN SODIUM 40 MG/0.4ML IJ SOSY
40.0000 mg | PREFILLED_SYRINGE | INTRAMUSCULAR | Status: DC
  Administered 2022-10-16 – 2022-10-17 (×2): 40 mg via SUBCUTANEOUS
  Filled 2022-10-15 (×2): qty 0.4

## 2022-10-15 MED ORDER — PANTOPRAZOLE SODIUM 40 MG IV SOLR
40.0000 mg | INTRAVENOUS | Status: DC
  Administered 2022-10-16: 40 mg via INTRAVENOUS
  Filled 2022-10-15: qty 10

## 2022-10-15 MED ORDER — LACTATED RINGERS IV BOLUS
1000.0000 mL | Freq: Once | INTRAVENOUS | Status: AC
  Administered 2022-10-15: 1000 mL via INTRAVENOUS

## 2022-10-15 MED ORDER — DEXTROSE 50 % IV SOLN: 0.0000 mL | INTRAVENOUS | Status: DC | PRN

## 2022-10-15 NOTE — ED Triage Notes (Signed)
Pt arrives via POV with CC of fatigue (weakness) that began this morning. Pt reports having 15-20 episodes of emesis throughout the day. Recent hospitalization for DKA, CHF, kidney failure, and sepsis. T1D. POC CBG 502.

## 2022-10-15 NOTE — ED Notes (Signed)
Cbg 374

## 2022-10-15 NOTE — ED Notes (Signed)
CBG 538. 

## 2022-10-15 NOTE — ED Provider Notes (Signed)
[facility] Provider Note    Event Date/Time   First MD Initiated Contact with Patient 10/15/22 2101     (approximate)   History   Weakness   HPI  [name] is a 26 y.o. female with a history of type 1 diabetes, CHF, elevated BMI who comes in with concerns for DKA.  Patient reports that she has had difficulties with getting her insulin and her way to read her sugars therefore she has been taking some old insulin pens but not keeping track of her sugars.  She reports that earlier this morning she started to feel bad with multiple episodes of vomiting as well as diarrhea.  Denies any abdominal discomfort.  Denies any falls chest pain, shortness of breath.  Reports that does not feel as severe as when she had DKA last time.     Physical Exam   Triage Vital Signs: ED Triage Vitals  Enc Vitals Group     BP 10/15/22 2039 118/70     Pulse Rate 10/15/22 2039 (!) 127     Resp 10/15/22 2039 20     Temp 10/15/22 2039 97.9 F (36.6 C)     Temp Source 10/15/22 2039 Oral     SpO2 10/15/22 2039 97 %     Weight 10/15/22 2040 150 lb (68 kg)     Height 10/15/22 2040 5\' 6"  (1.676 m)     Head Circumference --      Peak Flow --      Pain Score 10/15/22 2040 6     Pain Loc --      Pain Edu? --      Excl. in GC? --     Most recent vital signs: Vitals:   10/15/22 2039  BP: 118/70  Pulse: (!) 127  Resp: 20  Temp: 97.9 F (36.6 C)  SpO2: 97%     General: Awake, no distress.  CV:  Good peripheral perfusion.  Tachycardic Resp:  Normal effort.  Abd:  No distention.  Soft nontender Other:  No swelling in legs.  No calf tenderness   ED Results / Procedures / Treatments   Labs (all labs ordered are listed, but only abnormal results are displayed) Labs Reviewed  CBC WITH DIFFERENTIAL/PLATELET - Abnormal; Notable for the following components:      Result Value   WBC 19.5 (*)    MCV 100.2 (*)    Neutro Abs 16.5 (*)    Abs Immature Granulocytes 0.09 (*)     All other components within normal limits  CBG MONITORING, ED - Abnormal; Notable for the following components:   Glucose-Capillary 502 (*)    All other components within normal limits  COMPREHENSIVE METABOLIC PANEL  BLOOD GAS, VENOUS  BETA-HYDROXYBUTYRIC ACID  URINALYSIS, ROUTINE W REFLEX MICROSCOPIC     EKG  My interpretation of EKG:  Sinus tachycardia rate of 136 that any ST elevation or T wave inversions, normal intervals  RADIOLOGY I have reviewed the xray personally and interpreted and no pneumonia PROCEDURES:  Critical Care performed: Yes, see critical care procedure note(s)  .1-3 Lead EKG Interpretation  Performed by: [id number], MD Authorized by: Concha Se, MD     Interpretation: abnormal     ECG rate:  120   ECG rate assessment: tachycardic     Rhythm: sinus tachycardia     Ectopy: none     Conduction: normal   .Critical Care  Performed by: Concha Se, MD Authorized  by: Concha Se, MD   Critical care provider statement:    Critical care time (minutes):  30   Critical care was necessary to treat or prevent imminent or life-threatening deterioration of the following conditions:  Endocrine crisis   Critical care was time spent personally by me on the following activities:  Development of treatment plan with patient or surrogate, discussions with consultants, evaluation of patient's response to treatment, examination of patient, ordering and review of laboratory studies, ordering and review of radiographic studies, ordering and performing treatments and interventions, pulse oximetry, re-evaluation of patient's condition and review of old charts    MEDICATIONS ORDERED IN ED: Medications  insulin regular, human (MYXREDLIN) 100 units/ 100 mL infusion (has no administration in time range)  lactated ringers infusion (has no administration in time range)  dextrose 5 % in lactated ringers infusion (has no administration in time range)  dextrose 50 %  solution 0-50 mL (has no administration in time range)  lactated ringers bolus 1,000 mL (1,000 mLs Intravenous New Bag/Given 10/15/22 2142)  lactated ringers bolus 500 mL (500 mLs Intravenous New Bag/Given 10/15/22 2201)  ondansetron (ZOFRAN) injection 4 mg (4 mg Intravenous Given 10/15/22 2212)     IMPRESSION / MDM / ASSESSMENT AND PLAN / ED COURSE  I reviewed the triage vital signs and the nursing notes.   Patient's presentation is most consistent with severe exacerbation of chronic illness.   Patient comes in with vomiting diarrhea in the setting of diabetes is concerning for DKA.  Her abdomen is soft and nontender I low suspicion for appendicitis we will hold off on CT at this time will get blood work though to make sure no signs of pregnancy, COVID, flu, pneumonia, UTI.  Will start off with 1.5 L of fluid.  Do not want to be too aggressive given last time she required diuresis and is has diastolic CHF.  VBG shows patient has a pH of 7.18.  Her white count is elevated at 19.  Blood cultures were ordered.  Her creatinine is elevated at 1.5 glucose is elevated and patient is in DKA.  Urine with ketones but no signs of UTI.  Repeat abdominal exam remains soft and nontender.  Patient given some Zofran to help with nausea.  Discussed with patient and will hold off on CT at this time given abdomen is reassuring but she understands if she develops worsening pain to please let us know.  Will discuss with hospital for admission.  White count elevated but most likel reactive from vomiting- no source will hold off antibiotics for now.    The patient is on the cardiac monitor to evaluate for evidence of arrhythmia and/or significant heart rate changes.      FINAL CLINICAL IMPRESSION(S) / ED DIAGNOSES   Final diagnoses:  Diabetic ketoacidosis without coma associated with type 1 diabetes mellitus (HCC)     Rx / DC Orders   ED Discharge Orders     None        Note:  This document  was prepared using Dragon voice recognition software and may include unintentional dictation errors.   Concha Se, MD 10/15/22 2218

## 2022-10-15 NOTE — H&P (Signed)
History and Physical    Patient: [name] QPR:[id number] DOB: 02-Jan-1996 DOA: 10/15/2022 DOS: the patient was seen and examined on 10/16/2022 PCP: Pcp, No  Patient coming from: Home  Chief Complaint:  Chief Complaint  Patient presents with   Weakness   HPI: [name] is a 26 y.o. female with medical history significant for type 1 diabetes mellitus, chronic diastolic dysfunction CHF who was recently hospitalized about 6 weeks ago for severe metabolic acidosis secondary to DKA and metabolic encephalopathy.  Since her discharge, patient has had issues with her Dexcom and has not been checking her blood sugars like she should.  She said she counts her carbs and administers her insulin per sliding scale but has not been checking it regularly. She notes that she was in her usual state of health until this morning when she woke up feeling very weak and then developed nausea with several episodes of emesis, abdominal pain and diarrhea.  She denies having any fever or chills and has not been able to tolerate any oral intake.  Her mother was concerned that she may be going into DKA and she was brought into the ER for further evaluation.  Her blood sugar in the ER was 502. She complains of a burning sensation in her chest but denies having any diaphoresis, no shortness of breath or palpitations.  She denies having any urinary symptoms, no leg swelling, no headache, no cough, no fever, no chills, no blurred vision or focal deficits Significant labs include beta hydroxybutyric acid 7.20, lactic acid 6.0, sodium 136, potassium 5.4,  bicarb 9, glucose 573, BUN 31, creatinine 1.54 with a baseline of 0.89, white count of 19,000 VBG 7.18/26/9.7 Patient received 1.5 L fluid bolus and has been started on an insulin drip She will be admitted to the hospital for further evaluation   Review of Systems: As mentioned in the history of present illness. All other systems reviewed and are negative. Past Medical  History:  Diagnosis Date   Diabetes mellitus without complication (HCC)    DKA (diabetic ketoacidoses) 02/25/2017   Generalized anxiety disorder 01/31/2018   Heart murmur    at birth   Hypertension    Past Surgical History:  Procedure Laterality Date   NO PAST SURGERIES     Social History:  reports that she has never smoked. She has never used smokeless tobacco. She reports current alcohol use. She reports current drug use. Drug: Marijuana.  Allergies  Allergen Reactions   Sulfamethoxazole-Trimethoprim Hives   Pravastatin Other (See Comments)    Family History  Problem Relation Age of Onset   Diabetes Father    Renal cancer Father    Cancer Neg Hx     Prior to Admission medications   Medication Sig Start Date End Date Taking? Authorizing Provider  cholecalciferol (VITAMIN D3) 25 MCG (1000 UNIT) tablet Take 1,000 Units by mouth daily.   Yes [provider]  Continuous Blood Gluc Receiver (FREESTYLE LIBRE 2 READER) DEVI Use to monitor blood glucose. 08/08/19  Yes [provider]  insulin aspart (NOVOLOG) 100 UNIT/ML injection Inject 0-70 Units into the skin daily. 09/08/22  Yes [provider]  insulin detemir (LEVEMIR) 100 UNIT/ML injection Inject 0.12 mLs (12 Units total) into the skin 2 (two) times daily. Patient taking differently: Inject 32 Units into the skin daily. 11/08/19  Yes Lynn Ito, MD  levonorgestrel (MIRENA) 20 MCG/24HR IUD 1 each by Intrauterine route once.   Yes [provider]  losartan (COZAAR)  100 MG tablet Take 100 mg by mouth daily.   Yes [provider]  pantoprazole (PROTONIX) 40 MG tablet Take 1 tablet (40 mg total) by mouth daily. 11/09/19  Yes Lynn Ito, MD    Physical Exam: Vitals:   10/15/22 2039 10/15/22 2040 10/15/22 2355  BP: 118/70  111/60  Pulse: (!) 127  (!) 120  Resp: 20  18  Temp: 97.9 F (36.6 C)  98.8 F (37.1 C)  TempSrc: Oral  Oral  SpO2: 97%  100%  Weight:  68 kg   Height:  5\' 6"   (1.676 m)    Physical Exam Vitals and nursing note reviewed.  Constitutional:      Appearance: Normal appearance.  HENT:     Head: Normocephalic and atraumatic.     Nose: Nose normal.     Mouth/Throat:     Mouth: Mucous membranes are dry.  Eyes:     Conjunctiva/sclera: Conjunctivae normal.  Cardiovascular:     Rate and Rhythm: Tachycardia present.  Pulmonary:     Effort: Pulmonary effort is normal.     Breath sounds: Normal breath sounds.  Abdominal:     General: Abdomen is flat. Bowel sounds are normal.     Palpations: Abdomen is soft.  Musculoskeletal:     Cervical back: Normal range of motion and neck supple.  Skin:    General: Skin is warm and dry.  Neurological:     Mental Status: She is alert and oriented to person, place, and time.     Motor: Weakness present.  Psychiatric:        Mood and Affect: Mood normal.        Behavior: Behavior normal.     Data Reviewed: Relevant notes from primary care and specialist visits, past discharge summaries as available in EHR, including Care Everywhere. Prior diagnostic testing as pertinent to current admission diagnoses Updated medications and problem lists for reconciliation ED course, including vitals, labs, imaging, treatment and response to treatment Triage notes, nursing and pharmacy notes and ED provider's notes Notable results as noted in HPI Labs reviewed.  Sodium 136, potassium 5.4, chloride 99, bicarb 9, glucose 473, BUN 31, creatinine 1.5 compared to baseline of 0.89, calcium 9.6, total protein 8.4, albumin 4.7, AST 38, ALT 28, alkaline phosphatase 80, total bilirubin 1.7, white count 18.5, hemoglobin 12.9, hematocrit 40.4, platelet count 393 Respiratory viral panel is negative Chest x-ray reviewed by me shows no evidence of acute cardiopulmonary disease. Twelve-lead EKG shows sinus tachycardia There are no new results to review at this time.  Assessment and Plan: * DKA, type 1, not at goal [facility]) Patient admitted  to the hospital for DKA secondary to noncompliance Noted to have hyperglycemia with severe metabolic acidosis, hyperkalemia and an anion gap of 28 Continue aggressive IV fluid resuscitation Continue regular insulin infusion Check serial chemistry and beta hydroxybutyric acid levels Diabetic education    AKI (acute kidney injury) (HCC) Prerenal and secondary to GI losses from nausea, vomiting and diarrhea as well as osmotic diuresis from hyperglycemia Patient has baseline serum creatinine of 0.8 and today on admission is 1.5 Continue IV fluid resuscitation Repeat renal parameters in a.m.  Leukocytosis Most likely reactive No evidence of an infectious process We will continue to monitor       Advance Care Planning:   Code Status: Full Code   Consults: Diabetic Education  Family Communication: Greater than 50% of time was spent discussing patient's condition and plan of care with her and her  mother at the bedside.  All questions and concerns have been addressed.  They verbalized understanding and agree with the plan.  Severity of Illness: The appropriate patient status for this patient is INPATIENT. Inpatient status is judged to be reasonable and necessary in order to provide the required intensity of service to ensure the patient's safety. The patient's presenting symptoms, physical exam findings, and initial radiographic and laboratory data in the context of their chronic comorbidities is felt to place them at high risk for further clinical deterioration. Furthermore, it is not anticipated that the patient will be medically stable for discharge from the hospital within 2 midnights of admission.   * I certify that at the point of admission it is my clinical judgment that the patient will require inpatient hospital care spanning beyond 2 midnights from the point of admission due to high intensity of service, high risk for further deterioration and high frequency of surveillance  required.*  Author: Lucile Shutters, MD 10/16/2022 12:07 AM  For on call review [email or web address].

## 2022-10-15 NOTE — ED Notes (Signed)
1st set of cultures pulled from Left AC venipuncture and 2nd set obtained from IV start right [facility]

## 2022-10-15 NOTE — H&P (Incomplete)
History and Physical    Patient: [name] ZTI:[id number] DOB: 08/03/1996 DOA: 10/15/2022 DOS: the patient was seen and examined on 10/15/2022 PCP: Pcp, No  Patient coming from: Home  Chief Complaint:  Chief Complaint  Patient presents with  . Weakness   HPI: [name] is a 26 y.o. female with medical history significant for type 1 diabetes mellitus, chronic diastolic dysfunction CHF who was recently hospitalized about 6 weeks ago for severe metabolic acidosis secondary to DKA and metabolic encephalopathy.  Since her discharge, patient has had issues with her Dexcom and has not been checking her blood sugars like she should.  She said she counts her carbs and administers her insulin per sliding scale but has not been checking it regularly. She notes that she was in her usual state of health until this morning when she woke up feeling very weak and then developed nausea with several episodes of emesis, abdominal pain and diarrhea.  She denies having any fever or chills and has not been able to tolerate any oral intake.  Her mother was concerned that she may be going into DKA and she was brought into the ER for further evaluation.  Her blood sugar in the ER was 502. She complains of a burning sensation in her chest but denies having any diaphoresis, no shortness of breath or palpitations.  She denies having any urinary symptoms, no leg swelling, no headache, no cough, no fever, no chills, no blurred vision or focal deficits Significant labs include beta hydroxybutyric acid 7.20, lactic acid 6.0, sodium 136, potassium 5.4,  bicarb 9, glucose 573, BUN 31, creatinine 1.54 with a baseline of 0.89, white count of 19,000 VBG 7.18/26/9.7 Patient received 1.5 L fluid bolus and has been started on an insulin drip She will be admitted to the hospital for further evaluation   Review of Systems: As mentioned in the history of present illness. All other systems reviewed and are negative. Past Medical  History:  Diagnosis Date  . Diabetes mellitus without complication (HCC)   . DKA (diabetic ketoacidoses) 02/25/2017  . Generalized anxiety disorder 01/31/2018  . Heart murmur    at birth  . Hypertension    Past Surgical History:  Procedure Laterality Date  . NO PAST SURGERIES     Social History:  reports that she has never smoked. She has never used smokeless tobacco. She reports current alcohol use. She reports current drug use. Drug: Marijuana.  Allergies  Allergen Reactions  . Sulfamethoxazole-Trimethoprim Hives  . Pravastatin Other (See Comments)    Family History  Problem Relation Age of Onset  . Diabetes Father   . Renal cancer Father   . Cancer Neg Hx     Prior to Admission medications   Medication Sig Start Date End Date Taking? Authorizing Provider  cholecalciferol (VITAMIN D3) 25 MCG (1000 UNIT) tablet Take 1,000 Units by mouth daily.   Yes [provider]  Continuous Blood Gluc Receiver (FREESTYLE LIBRE 2 READER) DEVI Use to monitor blood glucose. 08/08/19  Yes [provider]  insulin aspart (NOVOLOG) 100 UNIT/ML injection Inject 0-70 Units into the skin daily. 09/08/22  Yes [provider]  insulin detemir (LEVEMIR) 100 UNIT/ML injection Inject 0.12 mLs (12 Units total) into the skin 2 (two) times daily. Patient taking differently: Inject 32 Units into the skin daily. 11/08/19  Yes Lynn Ito, MD  levonorgestrel (MIRENA) 20 MCG/24HR IUD 1 each by Intrauterine route once.   Yes [provider]  losartan (COZAAR)  100 MG tablet Take 100 mg by mouth daily.   Yes [provider]  pantoprazole (PROTONIX) 40 MG tablet Take 1 tablet (40 mg total) by mouth daily. 11/09/19  Yes Nolberto Hanlon, MD    Physical Exam: Vitals:   10/15/22 2039 10/15/22 2040  BP: 118/70   Pulse: (!) 127   Resp: 20   Temp: 97.9 F (36.6 C)   TempSrc: Oral   SpO2: 97%   Weight:  68 kg  Height:  5\' 6"  (1.676 m)   Physical Exam Vitals and nursing  note reviewed.  Constitutional:      Appearance: Normal appearance.  HENT:     Head: Normocephalic and atraumatic.     Nose: Nose normal.     Mouth/Throat:     Mouth: Mucous membranes are dry.  Eyes:     Conjunctiva/sclera: Conjunctivae normal.  Cardiovascular:     Rate and Rhythm: Tachycardia present.  Pulmonary:     Effort: Pulmonary effort is normal.     Breath sounds: Normal breath sounds.  Abdominal:     General: Abdomen is flat. Bowel sounds are normal.     Palpations: Abdomen is soft.  Musculoskeletal:     Cervical back: Normal range of motion and neck supple.  Skin:    General: Skin is warm and dry.  Neurological:     Mental Status: She is alert and oriented to person, place, and time.     Motor: Weakness present.  Psychiatric:        Mood and Affect: Mood normal.        Behavior: Behavior normal.     Data Reviewed: Relevant notes from primary care and specialist visits, past discharge summaries as available in EHR, including Care Everywhere. Prior diagnostic testing as pertinent to current admission diagnoses Updated medications and problem lists for reconciliation ED course, including vitals, labs, imaging, treatment and response to treatment Triage notes, nursing and pharmacy notes and ED provider's notes Notable results as noted in HPI Labs reviewed.  Sodium 136, potassium 5.4, chloride 99, bicarb 9, glucose 473, BUN 31, creatinine 1.5 compared to baseline of 0.89, calcium 9.6, total protein 8.4, albumin 4.7, AST 38, ALT 28, alkaline phosphatase 80, total bilirubin 1.7, white count 18.5, hemoglobin 12.9, hematocrit 40.4, platelet count 393 Respiratory viral panel is negative Chest x-ray reviewed by me shows no evidence of acute cardiopulmonary disease. Twelve-lead EKG shows sinus tachycardia There are no new results to review at this time.  Assessment and Plan: No notes have been filed under this hospital service. Service: Hospitalist     Advance Care  Planning:   Code Status: Full Code ***  Consults: ***  Family Communication: ***  Severity of Illness: {Observation/Inpatient:21159}  Author: Collier Bullock, MD 10/15/2022 11:46 PM  For on call review [email or web address].

## 2022-10-16 DIAGNOSIS — D72829 Elevated white blood cell count, unspecified: Secondary | ICD-10-CM | POA: Diagnosis present

## 2022-10-16 DIAGNOSIS — I5032 Chronic diastolic (congestive) heart failure: Secondary | ICD-10-CM

## 2022-10-16 DIAGNOSIS — N179 Acute kidney failure, unspecified: Secondary | ICD-10-CM

## 2022-10-16 DIAGNOSIS — I5030 Unspecified diastolic (congestive) heart failure: Secondary | ICD-10-CM | POA: Diagnosis present

## 2022-10-16 DIAGNOSIS — E1010 Type 1 diabetes mellitus with ketoacidosis without coma: Secondary | ICD-10-CM | POA: Diagnosis not present

## 2022-10-16 LAB — CBG MONITORING, ED
Glucose-Capillary: 220 mg/dL — ABNORMAL HIGH (ref 70–99)
Glucose-Capillary: 200 mg/dL — ABNORMAL HIGH (ref 70–99)
Glucose-Capillary: 212 mg/dL — ABNORMAL HIGH (ref 70–99)
Glucose-Capillary: 193 mg/dL — ABNORMAL HIGH (ref 70–99)
Glucose-Capillary: 176 mg/dL — ABNORMAL HIGH (ref 70–99)
Glucose-Capillary: 155 mg/dL — ABNORMAL HIGH (ref 70–99)
Glucose-Capillary: 326 mg/dL — ABNORMAL HIGH (ref 70–99)
Glucose-Capillary: 317 mg/dL — ABNORMAL HIGH (ref 70–99)
Glucose-Capillary: 227 mg/dL — ABNORMAL HIGH (ref 70–99)
Glucose-Capillary: 152 mg/dL — ABNORMAL HIGH (ref 70–99)
Glucose-Capillary: 43 mg/dL — CL (ref 70–99)
Glucose-Capillary: 89 mg/dL (ref 70–99)

## 2022-10-16 LAB — CBC WITH DIFFERENTIAL/PLATELET
Abs Immature Granulocytes: 0.06 10*3/uL (ref 0.00–0.07)
Basophils Absolute: 0 10*3/uL (ref 0.0–0.1)
Basophils Relative: 0 %
Eosinophils Absolute: 0 10*3/uL (ref 0.0–0.5)
Eosinophils Relative: 0 %
HCT: 29.9 % — ABNORMAL LOW (ref 36.0–46.0)
Hemoglobin: 10.1 g/dL — ABNORMAL LOW (ref 12.0–15.0)
Immature Granulocytes: 0 %
Lymphocytes Relative: 15 %
Lymphs Abs: 2.3 10*3/uL (ref 0.7–4.0)
MCH: 32.3 pg (ref 26.0–34.0)
MCHC: 33.8 g/dL (ref 30.0–36.0)
MCV: 95.5 fL (ref 80.0–100.0)
Monocytes Absolute: 0.8 10*3/uL (ref 0.1–1.0)
Monocytes Relative: 5 %
Neutro Abs: 12 10*3/uL — ABNORMAL HIGH (ref 1.7–7.7)
Neutrophils Relative %: 80 %
Platelets: 279 10*3/uL (ref 150–400)
RBC: 3.13 MIL/uL — ABNORMAL LOW (ref 3.87–5.11)
RDW: 12.5 % (ref 11.5–15.5)
WBC: 15.1 10*3/uL — ABNORMAL HIGH (ref 4.0–10.5)
nRBC: 0 % (ref 0.0–0.2)

## 2022-10-16 LAB — BASIC METABOLIC PANEL
Anion gap: 14 (ref 5–15)
Anion gap: 5 (ref 5–15)
Anion gap: 6 (ref 5–15)
Anion gap: 10 (ref 5–15)
BUN: 30 mg/dL — ABNORMAL HIGH (ref 6–20)
BUN: 29 mg/dL — ABNORMAL HIGH (ref 6–20)
BUN: 28 mg/dL — ABNORMAL HIGH (ref 6–20)
BUN: 28 mg/dL — ABNORMAL HIGH (ref 6–20)
CO2: 17 mmol/L — ABNORMAL LOW (ref 22–32)
CO2: 24 mmol/L (ref 22–32)
CO2: 23 mmol/L (ref 22–32)
CO2: 20 mmol/L — ABNORMAL LOW (ref 22–32)
Calcium: 9 mg/dL (ref 8.9–10.3)
Calcium: 8.9 mg/dL (ref 8.9–10.3)
Calcium: 8.7 mg/dL — ABNORMAL LOW (ref 8.9–10.3)
Calcium: 8.7 mg/dL — ABNORMAL LOW (ref 8.9–10.3)
Chloride: 108 mmol/L (ref 98–111)
Chloride: 108 mmol/L (ref 98–111)
Chloride: 109 mmol/L (ref 98–111)
Chloride: 105 mmol/L (ref 98–111)
Creatinine, Ser: 1.44 mg/dL — ABNORMAL HIGH (ref 0.44–1.00)
Creatinine, Ser: 1.2 mg/dL — ABNORMAL HIGH (ref 0.44–1.00)
Creatinine, Ser: 1.13 mg/dL — ABNORMAL HIGH (ref 0.44–1.00)
Creatinine, Ser: 1.22 mg/dL — ABNORMAL HIGH (ref 0.44–1.00)
GFR, Estimated: 51 mL/min — ABNORMAL LOW (ref >60)
GFR, Estimated: >60 mL/min (ref >60)
GFR, Estimated: >60 mL/min (ref >60)
GFR, Estimated: >60 mL/min (ref >60)
Glucose, Bld: 214 mg/dL — ABNORMAL HIGH (ref 70–99)
Glucose, Bld: 181 mg/dL — ABNORMAL HIGH (ref 70–99)
Glucose, Bld: 161 mg/dL — ABNORMAL HIGH (ref 70–99)
Glucose, Bld: 210 mg/dL — ABNORMAL HIGH (ref 70–99)
Potassium: 4.3 mmol/L (ref 3.5–5.1)
Potassium: 4.1 mmol/L (ref 3.5–5.1)
Potassium: 4.3 mmol/L (ref 3.5–5.1)
Potassium: 3.9 mmol/L (ref 3.5–5.1)
Sodium: 139 mmol/L (ref 135–145)
Sodium: 137 mmol/L (ref 135–145)
Sodium: 138 mmol/L (ref 135–145)
Sodium: 135 mmol/L (ref 135–145)

## 2022-10-16 LAB — LACTIC ACID, PLASMA
Lactic Acid, Venous: 2.2 mmol/L (ref 0.5–1.9)
Lactic Acid, Venous: 1.2 mmol/L (ref 0.5–1.9)

## 2022-10-16 LAB — TROPONIN I (HIGH SENSITIVITY)
Troponin I (High Sensitivity): 9 ng/L (ref <18)
Troponin I (High Sensitivity): 31 ng/L — ABNORMAL HIGH (ref <18)
Troponin I (High Sensitivity): 45 ng/L — ABNORMAL HIGH (ref <18)
Troponin I (High Sensitivity): 49 ng/L — ABNORMAL HIGH (ref <18)

## 2022-10-16 LAB — BETA-HYDROXYBUTYRIC ACID
Beta-Hydroxybutyric Acid: 0.43 mmol/L — ABNORMAL HIGH (ref 0.05–0.27)
Beta-Hydroxybutyric Acid: 0.55 mmol/L — ABNORMAL HIGH (ref 0.05–0.27)
Beta-Hydroxybutyric Acid: 0.42 mmol/L — ABNORMAL HIGH (ref 0.05–0.27)

## 2022-10-16 LAB — BRAIN NATRIURETIC PEPTIDE: B Natriuretic Peptide: 337.7 pg/mL — ABNORMAL HIGH (ref 0.0–100.0)

## 2022-10-16 MED ORDER — PANTOPRAZOLE SODIUM 40 MG PO TBEC
40.0000 mg | DELAYED_RELEASE_TABLET | Freq: Two times a day (BID) | ORAL | Status: DC
  Administered 2022-10-16 – 2022-10-17 (×2): 40 mg via ORAL
  Filled 2022-10-16 (×2): qty 1

## 2022-10-16 MED ORDER — INSULIN ASPART 100 UNIT/ML IJ SOLN
4.0000 [IU] | Freq: Three times a day (TID) | INTRAMUSCULAR | Status: DC
  Administered 2022-10-16 (×2): 4 [IU] via SUBCUTANEOUS

## 2022-10-16 MED ORDER — INSULIN DETEMIR 100 UNIT/ML ~~LOC~~ SOLN
15.0000 [IU] | Freq: Two times a day (BID) | SUBCUTANEOUS | Status: DC
  Administered 2022-10-16 – 2022-10-17 (×3): 15 [IU] via SUBCUTANEOUS
  Filled 2022-10-16 (×5): qty 0.15

## 2022-10-16 MED ORDER — INSULIN ASPART 100 UNIT/ML IJ SOLN
0.0000 [IU] | Freq: Three times a day (TID) | INTRAMUSCULAR | Status: DC
  Administered 2022-10-16: 5 [IU] via SUBCUTANEOUS
  Administered 2022-10-16: 3 [IU] via SUBCUTANEOUS
  Filled 2022-10-16 (×2): qty 1

## 2022-10-16 MED ORDER — ONDANSETRON HCL 4 MG/2ML IJ SOLN: 4.0000 mg | Freq: Three times a day (TID) | INTRAMUSCULAR | Status: DC | PRN

## 2022-10-16 MED ORDER — INSULIN ASPART 100 UNIT/ML IJ SOLN: 0.0000 [IU] | Freq: Every day | INTRAMUSCULAR | Status: DC

## 2022-10-16 NOTE — Assessment & Plan Note (Addendum)
Patient has baseline serum creatinine of 0.8 and on admission is 1.5>>>1.22 Continue IV fluid resuscitation Repeat renal parameters in a.m.

## 2022-10-16 NOTE — ED Notes (Signed)
Cbg 220

## 2022-10-16 NOTE — ED Notes (Signed)
MD now has verbally secured chatted and wants insulin drip stopped and started on SSI

## 2022-10-16 NOTE — ED Notes (Signed)
Pt given small amount of peanut butter and crackers.

## 2022-10-16 NOTE — ED Notes (Signed)
CBG 200 

## 2022-10-16 NOTE — Assessment & Plan Note (Signed)
Patient with most recent echo done in November 2023 with normal EF and grade 2 diastolic dysfunction.  Clinically appears euvolemic. Receiving IV fluid due to DKA and AKI. -Check BNP -Monitor volume status

## 2022-10-16 NOTE — Assessment & Plan Note (Addendum)
Patient admitted to the hospital for DKA secondary to noncompliance Noted to have hyperglycemia with severe metabolic acidosis, hyperkalemia and an anion gap of 28 Insulin infusion is being transitioned to basal and short-acting as gap for closed X 2. -Levemir 15 units twice daily -Moderate SSI -4 units with meal

## 2022-10-16 NOTE — ED Notes (Signed)
Pt denies symptoms of hypoglycemia. Pt given 2 OJ. Pt drinking OJ now.

## 2022-10-16 NOTE — Hospital Course (Signed)
Taken from H&P.   [name] is a 26 y.o. female with medical history significant for type 1 diabetes mellitus, chronic diastolic dysfunction CHF who was recently hospitalized about 6 weeks ago for severe metabolic acidosis secondary to DKA and metabolic encephalopathy.  Since her discharge, patient has had issues with her Dexcom and has not been checking her blood sugars like she should.  She said she counts her carbs and administers her insulin per sliding scale but has not been checking it regularly. She notes that she was in her usual state of health until this morning when she woke up feeling very weak and then developed nausea with several episodes of emesis, abdominal pain and diarrhea.  She denies having any fever or chills and has not been able to tolerate any oral intake.  ED course.  Vital stable, blood glucose at 573, beta-hydroxybutyrate acid 7.20, lactic acid 6, sodium 136, potassium 5.4, bicarb 9, BUN 31, creatinine 1.54 with baseline of 0.89, leukocytosis at 19,000,with a baseline of 0.89, anion gap of 28, white count of 19,000, VBG 7.18/26/9.7 Patient received 1.5 L fluid bolus and has been started on an insulin drip per DKA protocol.  12/18: Vital stable, CBG at 155, beta hydroxybutyric acid earlier today was 0.43, renal functions improving but creatinine still above baseline at 1.20.  Bicarb normalized at 24 transitioning her to basal and short-acting, ordered for Levemir 15 units twice daily along with moderate scale SSI and 4 units of mealtime coverage.  We will continue IV fluid today.  12/19: Patient remained hemodynamically stable.  Had 1 episode of hypoglycemia overnight requiring p.o. intervention.  We adjusted her insulin while staying in the hospital.  Per patient she did not eat her lunch.  This morning she was feeling much improved, no more nausea or vomiting. She was advised to continue checking her blood glucose level regularly and administer insulin as she was doing it  before.  She also need a close follow-up by her endocrinologist and need help to fix her Dexcom.  Patient will continue on current medications and need to have a close follow-up with her providers for further recommendations.Marland Kitchen

## 2022-10-16 NOTE — ED Notes (Signed)
Pt given a diet gingerale with ice as requested. Pt's resp reg/unlabored, skin dry and calmly sitting in bed watching tv and talking with visitors. Stretcher locked low, rail up and call bell within reach.

## 2022-10-16 NOTE — ED Notes (Signed)
Blood for BMET and Trop was sent down at 0821 and still has not resulted so Lab has been called to resolve the problem.

## 2022-10-16 NOTE — ED Notes (Signed)
MD did not clarify in order that she wanted levemir given and THEN stop drips 2 hours after.  Drips restarted per Dr Nelson Chimes verbal order.  Charge RN aware.

## 2022-10-16 NOTE — ED Notes (Signed)
Pt's family member on the phone and states that she should be on zofran because that is what works for the pt and their family" and they don't understand why it was changed. Pt's family is also concerned about her high WBC and that they don't feel that it is being addressed. She is concerned that things are getting a bandaid and not being fully addressed.

## 2022-10-16 NOTE — Inpatient Diabetes Management (Addendum)
Inpatient Diabetes Program Recommendations  AACE/ADA: New Consensus Statement on Inpatient Glycemic Control (2015)  Target Ranges:  Prepandial:   less than 140 mg/dL      Peak postprandial:   less than 180 mg/dL (1-2 hours)      Critically ill patients:  140 - 180 mg/dL   Lab Results  Component Value Date   GLUCAP 155 (H) 10/16/2022   HGBA1C 9.3 (H) 08/31/2022    Latest Reference Range & Units 10/15/22 20:52  Sodium 135 - 145 mmol/L 136  Potassium 3.5 - 5.1 mmol/L 5.4 (H)  Chloride 98 - 111 mmol/L 99  CO2 22 - 32 mmol/L 9 (L)  Glucose 70 - 99 mg/dL 381 (HH)  BUN 6 - 20 mg/dL 31 (H)  Creatinine 8.29 - 1.00 mg/dL 9.37 (H)  Calcium 8.9 - 10.3 mg/dL 9.6  Anion gap 5 - 15  28 (H)  Alkaline Phosphatase 38 - 126 U/L 80  Albumin 3.5 - 5.0 g/dL 4.7  AST 15 - 41 U/L 38  ALT 0 - 44 U/L 28  Total Protein 6.5 - 8.1 g/dL 8.4 (H)  Total Bilirubin 0.3 - 1.2 mg/dL 1.7 (H)  GFR, Estimated >60 mL/min 47 (L)    Latest Reference Range & Units 10/16/22 06:03  Sodium 135 - 145 mmol/L 137  Potassium 3.5 - 5.1 mmol/L 4.1  Chloride 98 - 111 mmol/L 108  CO2 22 - 32 mmol/L 24  Glucose 70 - 99 mg/dL 169 (H)  BUN 6 - 20 mg/dL 29 (H)  Creatinine 6.78 - 1.00 mg/dL 9.38 (H)  Calcium 8.9 - 10.3 mg/dL 8.9  Anion gap 5 - 15  5  GFR, Estimated >60 mL/min >60   Diabetes history:  DM1 Outpatient Diabetes medications:  Levemir 32 units qd Insulin Carb ratio--1 units:3 carbs Insulin correction factor--1 unit for every 50 > 150 Endo-Dr. Gershon Crane  Inpatient Diabetes Program Recommendations:   Patient was seen by DM coordinator @ last admission 08/29/22. Spoke with patient regarding followup plans from last admission. Patient has not received the transmitter to connect to her Dexcom 6 but only received supplies. Patient could not find her glucose meter @ home to check her CBGs. Patient agrees to call Dr. Elberta Fortis office to schedule a followup appointment, call insulin pump trainer to set up next  training time. Reviewed with patient if she does not have a sensor on or transmitter available, she needs to check CBGs with a meter. Patient agrees to plan and able to bring patient a Dexcom 7  Libre sensor if ok with MD. Discharge needs: CBG Meter & supplies [phone]  Thank you, Billy Fischer. Saryah Loper, RN, MSN, CDE  Diabetes Coordinator Inpatient Glycemic Control Team Team Pager [phone] (8am-5pm) 10/16/2022 9:43 AM

## 2022-10-16 NOTE — Assessment & Plan Note (Signed)
Most likely reactive No evidence of an infectious process We will continue to monitor

## 2022-10-16 NOTE — Progress Notes (Signed)
Progress Note   Patient: [name] DOB: [birth year] DOA: 10/15/2022     1 DOS: the patient was seen and examined on 10/16/2022   Brief hospital course: Taken from H&P.   [name] is a 26 y.o. female with medical history significant for type 1 diabetes mellitus, chronic diastolic dysfunction CHF who was recently hospitalized about 6 weeks ago for severe metabolic acidosis secondary to DKA and metabolic encephalopathy.  Since her discharge, patient has had issues with her Dexcom and has not been checking her blood sugars like she should.  She said she counts her carbs and administers her insulin per sliding scale but has not been checking it regularly. She notes that she was in her usual state of health until this morning when she woke up feeling very weak and then developed nausea with several episodes of emesis, abdominal pain and diarrhea.  She denies having any fever or chills and has not been able to tolerate any oral intake.  ED course.  Vital stable, blood glucose at 573, beta-hydroxybutyrate acid 7.20, lactic acid 6, sodium 136, potassium 5.4, bicarb 9, BUN 31, creatinine 1.54 with baseline of 0.89, leukocytosis at 19,000,with a baseline of 0.89, anion gap of 28, white count of 19,000, VBG 7.18/26/9.7 Patient received 1.5 L fluid bolus and has been started on an insulin drip per DKA protocol.  12/18: Vital stable, CBG at 155, beta hydroxybutyric acid earlier today was 0.43, renal functions improving but creatinine still above baseline at 1.20.  Bicarb normalized at 24 transitioning her to basal and short-acting, ordered for Levemir 15 units twice daily along with moderate scale SSI and 4 units of mealtime coverage.  We will continue IV fluid today.    Assessment and Plan: * DKA, type 1, not at goal [facility]) Patient admitted to the hospital for DKA secondary to noncompliance Noted to have hyperglycemia with severe metabolic acidosis, hyperkalemia and an anion gap of  28 Insulin infusion is being transitioned to basal and short-acting as gap for closed X 2. -Levemir 15 units twice daily -Moderate SSI -4 units with meal    AKI (acute kidney injury) (HCC) Patient has baseline serum creatinine of 0.8 and on admission is 1.5>>>1.22 Continue IV fluid resuscitation Repeat renal parameters in a.m.  Leukocytosis Most likely reactive No evidence of an infectious process We will continue to monitor   (HFpEF) heart failure with preserved ejection fraction [facility]) Patient with most recent echo done in November 2023 with normal EF and grade 2 diastolic dysfunction.  Clinically appears euvolemic. Receiving IV fluid due to DKA and AKI. -Check BNP -Monitor volume status   Subjective: Patient was seen and examined today.  Nausea, vomiting and diarrhea has been resolved.  Recent upper respiratory symptoms which are now improving.  Apparently having some difficulty with her Dexcom.  Physical Exam: Vitals:   10/16/22 0900 10/16/22 0955 10/16/22 1250 10/16/22 1500  BP: (!) 96/58 (!) 106/51 110/75 (!) 97/53  Pulse: 95 (!) 121 96 95  Resp: 13 (!) 23 19 16   Temp:   98.3 F (36.8 C)   TempSrc:   Oral   SpO2: 99% 99% 100% 99%  Weight:      Height:       General.  Well-developed lady, in no acute distress. Pulmonary.  Lungs clear bilaterally, normal respiratory effort. CV.  Regular rate and rhythm, no JVD, rub or murmur. Abdomen.  Soft, nontender, nondistended, BS positive. CNS.  Alert and oriented .  No focal neurologic  deficit. Extremities.  No edema, no cyanosis, pulses intact and symmetrical. Psychiatry.  Judgment and insight appears normal.   Data Reviewed: Prior data reviewed  Family Communication: Discussed with patient  Disposition: Status is: Inpatient Remains inpatient appropriate because: Severity of illness  Planned Discharge Destination: Home  DVT prophylaxis.  Lovenox Time spent: 50 minutes  This record has been created using Metallurgist. Errors have been sought and corrected,but may not always be located. Such creation errors do not reflect on the standard of care.   Author: Arnetha Courser, MD 10/16/2022 3:10 PM  For on call review [email or web address].

## 2022-10-17 ENCOUNTER — Encounter: Payer: Self-pay | Admitting: Internal Medicine

## 2022-10-17 DIAGNOSIS — N179 Acute kidney failure, unspecified: Secondary | ICD-10-CM | POA: Diagnosis not present

## 2022-10-17 DIAGNOSIS — E1010 Type 1 diabetes mellitus with ketoacidosis without coma: Secondary | ICD-10-CM | POA: Diagnosis not present

## 2022-10-17 DIAGNOSIS — D72829 Elevated white blood cell count, unspecified: Secondary | ICD-10-CM | POA: Diagnosis not present

## 2022-10-17 LAB — CBC
HCT: 28.3 % — ABNORMAL LOW (ref 36.0–46.0)
Hemoglobin: 9.5 g/dL — ABNORMAL LOW (ref 12.0–15.0)
MCH: 31.8 pg (ref 26.0–34.0)
MCHC: 33.6 g/dL (ref 30.0–36.0)
MCV: 94.6 fL (ref 80.0–100.0)
Platelets: 201 10*3/uL (ref 150–400)
RBC: 2.99 MIL/uL — ABNORMAL LOW (ref 3.87–5.11)
RDW: 12.4 % (ref 11.5–15.5)
WBC: 7.9 10*3/uL (ref 4.0–10.5)
nRBC: 0 % (ref 0.0–0.2)

## 2022-10-17 LAB — BASIC METABOLIC PANEL
Anion gap: 8 (ref 5–15)
Anion gap: 4 — ABNORMAL LOW (ref 5–15)
BUN: 17 mg/dL (ref 6–20)
BUN: 15 mg/dL (ref 6–20)
CO2: 22 mmol/L (ref 22–32)
CO2: 26 mmol/L (ref 22–32)
Calcium: 8.6 mg/dL — ABNORMAL LOW (ref 8.9–10.3)
Calcium: 8 mg/dL — ABNORMAL LOW (ref 8.9–10.3)
Chloride: 107 mmol/L (ref 98–111)
Chloride: 108 mmol/L (ref 98–111)
Creatinine, Ser: 0.98 mg/dL (ref 0.44–1.00)
Creatinine, Ser: 0.91 mg/dL (ref 0.44–1.00)
GFR, Estimated: >60 mL/min (ref >60)
GFR, Estimated: >60 mL/min (ref >60)
Glucose, Bld: 82 mg/dL (ref 70–99)
Glucose, Bld: 92 mg/dL (ref 70–99)
Potassium: 3.6 mmol/L (ref 3.5–5.1)
Potassium: 3.1 mmol/L — ABNORMAL LOW (ref 3.5–5.1)
Sodium: 137 mmol/L (ref 135–145)
Sodium: 138 mmol/L (ref 135–145)

## 2022-10-17 LAB — CBG MONITORING, ED
Glucose-Capillary: 75 mg/dL (ref 70–99)
Glucose-Capillary: 85 mg/dL (ref 70–99)

## 2022-10-17 LAB — GLUCOSE, CAPILLARY
Glucose-Capillary: 38 mg/dL — CL (ref 70–99)
Glucose-Capillary: 53 mg/dL — ABNORMAL LOW (ref 70–99)
Glucose-Capillary: 75 mg/dL (ref 70–99)

## 2022-10-17 LAB — MAGNESIUM: Magnesium: 1.7 mg/dL (ref 1.7–2.4)

## 2022-10-17 LAB — TROPONIN I (HIGH SENSITIVITY): Troponin I (High Sensitivity): 42 ng/L — ABNORMAL HIGH (ref <18)

## 2022-10-17 MED ORDER — LOSARTAN POTASSIUM 50 MG PO TABS
100.0000 mg | ORAL_TABLET | Freq: Every day | ORAL | Status: DC
  Administered 2022-10-17: 100 mg via ORAL
  Filled 2022-10-17: qty 2

## 2022-10-17 MED ORDER — POTASSIUM CHLORIDE CRYS ER 20 MEQ PO TBCR
40.0000 meq | EXTENDED_RELEASE_TABLET | Freq: Once | ORAL | Status: AC
  Administered 2022-10-17: 40 meq via ORAL
  Filled 2022-10-17: qty 2

## 2022-10-17 MED ORDER — INSULIN ASPART 100 UNIT/ML IJ SOLN: 0.0000 [IU] | Freq: Three times a day (TID) | INTRAMUSCULAR | Status: DC

## 2022-10-17 MED ORDER — INSULIN ASPART 100 UNIT/ML IJ SOLN: 2.0000 [IU] | Freq: Three times a day (TID) | INTRAMUSCULAR | Status: DC

## 2022-10-17 NOTE — Progress Notes (Signed)
       CROSS COVER NOTE  NAME: [name] MRN: [id number] DOB : Nov 02, 1995 ATTENDING PHYSICIAN: Arnetha Courser, MD    Date of Service   10/17/2022   HPI/Events of Note   Request received from [facility] to evaluate patient for downgrade. [name] was initially admitted as Stepdown status secondary to DKA requiring insulin infusion. Insulin infusion was stopped around 1PM yesterday and patient was transitioned to subcutaneous insulin. Vital signs are stable at this time. Patient has active cardiac monitoring order, will Admit to Telemetry Medical.  Interventions   Assessment/Plan:  Admit to Telemetry Medical       To reach the provider On-Call:   7AM- 7PM see care teams to locate the attending and reach out to them via [email or web address]. 7PM-7AM contact night-coverage If you still have difficulty reaching the appropriate provider, please page the [facility] (Director on Call) for [facility] on amion for assistance  This document was prepared using Sales executive software and may include unintentional dictation errors.  Selena Limbo DNP, MBA, FNP-BC Nurse Practitioner [facility] Pager [phone]

## 2022-10-17 NOTE — Progress Notes (Signed)
Per Dr Carleene Cooper tele monitoring

## 2022-10-17 NOTE — ED Notes (Signed)
Pt given a packet of peanut butter.

## 2022-10-17 NOTE — Progress Notes (Signed)
  Transition of Care (TOC) Screening Note   Patient Details  Name: [name] Date of Birth: February 02, 1996   Transition of Care [facility]) CM/SW Contact:    Colin Broach, LCSW Phone Number: 10/17/2022, 8:58 AM    Transition of Care Department [facility] has reviewed patient and no TOC needs have been identified at this time. We will continue to monitor patient advancement through interdisciplinary progression rounds. If new patient transition needs arise, please place a TOC consult.

## 2022-10-17 NOTE — Discharge Summary (Signed)
Physician Discharge Summary   Patient: [name] MRN: [id number] DOB: Dec 03, 1995  Admit date:     10/15/2022  Discharge date: 10/17/22  Discharge Physician: Arnetha Courser   PCP: Pcp, No   Recommendations at discharge:  Follow-up with primary care provider Follow-up with endocrinologist  Discharge Diagnoses: Principal Problem:   DKA, type 1, not at goal [facility]) Active Problems:   AKI (acute kidney injury) (HCC)   Leukocytosis   (HFpEF) heart failure with preserved ejection fraction [facility])   Hospital Course: Taken from H&P.   [name] is a 26 y.o. female with medical history significant for type 1 diabetes mellitus, chronic diastolic dysfunction CHF who was recently hospitalized about 6 weeks ago for severe metabolic acidosis secondary to DKA and metabolic encephalopathy.  Since her discharge, patient has had issues with her Dexcom and has not been checking her blood sugars like she should.  She said she counts her carbs and administers her insulin per sliding scale but has not been checking it regularly. She notes that she was in her usual state of health until this morning when she woke up feeling very weak and then developed nausea with several episodes of emesis, abdominal pain and diarrhea.  She denies having any fever or chills and has not been able to tolerate any oral intake.  ED course.  Vital stable, blood glucose at 573, beta-hydroxybutyrate acid 7.20, lactic acid 6, sodium 136, potassium 5.4, bicarb 9, BUN 31, creatinine 1.54 with baseline of 0.89, leukocytosis at 19,000,with a baseline of 0.89, anion gap of 28, white count of 19,000, VBG 7.18/26/9.7 Patient received 1.5 L fluid bolus and has been started on an insulin drip per DKA protocol.  12/18: Vital stable, CBG at 155, beta hydroxybutyric acid earlier today was 0.43, renal functions improving but creatinine still above baseline at 1.20.  Bicarb normalized at 24 transitioning her to basal and short-acting, ordered for  Levemir 15 units twice daily along with moderate scale SSI and 4 units of mealtime coverage.  We will continue IV fluid today.  12/19: Patient remained hemodynamically stable.  Had 1 episode of hypoglycemia overnight requiring p.o. intervention.  We adjusted her insulin while staying in the hospital.  Per patient she did not eat her lunch.  This morning she was feeling much improved, no more nausea or vomiting. She was advised to continue checking her blood glucose level regularly and administer insulin as she was doing it before.  She also need a close follow-up by her endocrinologist and need help to fix her Dexcom.  Patient will continue on current medications and need to have a close follow-up with her providers for further recommendations..  Assessment and Plan: * DKA, type 1, not at goal [facility]) Patient admitted to the hospital for DKA secondary to noncompliance Noted to have hyperglycemia with severe metabolic acidosis, hyperkalemia and an anion gap of 28 Insulin infusion is being transitioned to basal and short-acting as gap for closed X 2. -Levemir 15 units twice daily -Moderate SSI -4 units with meal    AKI (acute kidney injury) (HCC) Patient has baseline serum creatinine of 0.8 and on admission is 1.5>>>1.22 Continue IV fluid resuscitation Repeat renal parameters in a.m.  Leukocytosis Most likely reactive No evidence of an infectious process We will continue to monitor   (HFpEF) heart failure with preserved ejection fraction [facility]) Patient with most recent echo done in November 2023 with normal EF and grade 2 diastolic dysfunction.  Clinically appears euvolemic. Receiving IV fluid  due to DKA and AKI. -Check BNP -Monitor volume status   Consultants: None Procedures performed: None Disposition: Home Diet recommendation:  Discharge Diet Orders (From admission, onward)     Start     Ordered   10/17/22 0000  Diet - low sodium heart healthy        10/17/22 1040            Cardiac and Carb modified diet DISCHARGE MEDICATION: Allergies as of 10/17/2022       Reactions   Sulfamethoxazole-trimethoprim Hives   Pravastatin Other (See Comments)        Medication List     TAKE these medications    cholecalciferol 25 MCG (1000 UNIT) tablet Commonly known as: VITAMIN D3 Take 1,000 Units by mouth daily.   FreeStyle Libre 2 Reader [place] Use to monitor blood glucose.   insulin aspart 100 UNIT/ML injection Commonly known as: novoLOG Inject 0-70 Units into the skin daily.   insulin detemir 100 UNIT/ML injection Commonly known as: LEVEMIR Inject 0.12 mLs (12 Units total) into the skin 2 (two) times daily. What changed:  how much to take when to take this   levonorgestrel 20 MCG/24HR IUD Commonly known as: MIRENA 1 each by Intrauterine route once.   losartan 100 MG tablet Commonly known as: COZAAR Take 100 mg by mouth daily.   pantoprazole 40 MG tablet Commonly known as: PROTONIX Take 1 tablet (40 mg total) by mouth daily.        Discharge Exam: Filed Weights   10/15/22 2040  Weight: 68 kg   General.     In no acute distress. Pulmonary.  Lungs clear bilaterally, normal respiratory effort. CV.  Regular rate and rhythm, no JVD, rub or murmur. Abdomen.  Soft, nontender, nondistended, BS positive. CNS.  Alert and oriented .  No focal neurologic deficit. Extremities.  No edema, no cyanosis, pulses intact and symmetrical. Psychiatry.  Judgment and insight appears normal.   Condition at discharge: stable  The results of significant diagnostics from this hospitalization (including imaging, microbiology, ancillary and laboratory) are listed below for reference.   Imaging Studies: DG Chest Portable 1 View  Result Date: 10/15/2022 CLINICAL DATA:  Cough, fatigue and weakness. EXAM: PORTABLE CHEST 1 VIEW COMPARISON:  Portable chest 08/30/2022 FINDINGS: The heart size and mediastinal contours are within normal limits. Both lungs are  clear. The visualized skeletal structures are unremarkable. IMPRESSION: No active disease. Compare: Interval resolution of prior interstitial consolidation and patchy airspace disease in the left lung. Electronically Signed   By: Almira Bar M.D.   On: 10/15/2022 21:24    Microbiology: Results for orders placed or performed during the hospital encounter of 10/15/22  Blood culture (routine x 2)     Status: None (Preliminary result)   Collection Time: 10/15/22  9:08 PM   Specimen: BLOOD  Result Value Ref Range Status   Specimen Description BLOOD RIGHT ANTECUBITAL  Final   Special Requests   Final    BOTTLES DRAWN AEROBIC AND ANAEROBIC Blood Culture adequate volume   Culture   Final    NO GROWTH 2 DAYS Performed at [facility], [address], [place], [place] [address]    Report Status PENDING  Incomplete  Resp panel by RT-PCR (RSV, Flu A&B, Covid) Anterior Nasal Swab     Status: None   Collection Time: 10/15/22  9:08 PM   Specimen: Anterior Nasal Swab  Result Value Ref Range Status   SARS Coronavirus 2 by RT PCR NEGATIVE  NEGATIVE Final    Comment: (NOTE) SARS-CoV-2 target nucleic acids are NOT DETECTED.  The SARS-CoV-2 RNA is generally detectable in upper respiratory specimens during the acute phase of infection. The lowest concentration of SARS-CoV-2 viral copies this assay can detect is 138 copies/mL. A negative result does not preclude SARS-Cov-2 infection and should not be used as the sole basis for treatment or other patient management decisions. A negative result may occur with  improper specimen collection/handling, submission of specimen other than nasopharyngeal swab, presence of viral mutation(s) within the areas targeted by this assay, and inadequate number of viral copies(<138 copies/mL). A negative result must be combined with clinical observations, patient history, and epidemiological information. The expected result is Negative.  Fact Sheet for  Patients:  BloggerCourse.comhttps://www.fda.gov/media/152166/download  Fact Sheet for Healthcare Providers:  SeriousBroker.ithttps://www.fda.gov/media/152162/download  This test is no t yet approved or cleared by the [place] FDA and  has been authorized for detection and/or diagnosis of SARS-CoV-2 by FDA under an Emergency Use Authorization (EUA). This EUA will remain  in effect (meaning this test can be used) for the duration of the COVID-19 declaration under Section 564(b)(1) of the Act, 21 U.S.C.section 360bbb-3(b)(1), unless the authorization is terminated  or revoked sooner.       Influenza A by PCR NEGATIVE NEGATIVE Final   Influenza B by PCR NEGATIVE NEGATIVE Final    Comment: (NOTE) The Xpert Xpress SARS-CoV-2/FLU/RSV plus assay is intended as an aid in the diagnosis of influenza from Nasopharyngeal swab specimens and should not be used as a sole basis for treatment. Nasal washings and aspirates are unacceptable for Xpert Xpress SARS-CoV-2/FLU/RSV testing.  Fact Sheet for Patients: BloggerCourse.comhttps://www.fda.gov/media/152166/download  Fact Sheet for Healthcare Providers: SeriousBroker.ithttps://www.fda.gov/media/152162/download  This test is not yet approved or cleared by the [place] FDA and has been authorized for detection and/or diagnosis of SARS-CoV-2 by FDA under an Emergency Use Authorization (EUA). This EUA will remain in effect (meaning this test can be used) for the duration of the COVID-19 declaration under Section 564(b)(1) of the Act, 21 U.S.C. section 360bbb-3(b)(1), unless the authorization is terminated or revoked.     Resp Syncytial Virus by PCR NEGATIVE NEGATIVE Final    Comment: (NOTE) Fact Sheet for Patients: BloggerCourse.comhttps://www.fda.gov/media/152166/download  Fact Sheet for Healthcare Providers: SeriousBroker.ithttps://www.fda.gov/media/152162/download  This test is not yet approved or cleared by the [place] FDA and has been authorized for detection and/or diagnosis of SARS-CoV-2 by FDA under an Emergency  Use Authorization (EUA). This EUA will remain in effect (meaning this test can be used) for the duration of the COVID-19 declaration under Section 564(b)(1) of the Act, 21 U.S.C. section 360bbb-3(b)(1), unless the authorization is terminated or revoked.  Performed at [facility], [address] [place], [place] [address]   Blood culture (routine x 2)     Status: None (Preliminary result)   Collection Time: 10/15/22  9:13 PM   Specimen: BLOOD  Result Value Ref Range Status   Specimen Description BLOOD LEFT ANTECUBITAL  Final   Special Requests   Final    BOTTLES DRAWN AEROBIC AND ANAEROBIC Blood Culture adequate volume   Culture   Final    NO GROWTH 2 DAYS Performed at [facility], [address] [place], [place] [address]    Report Status PENDING  Incomplete    Labs: CBC: Recent Labs  Lab 10/15/22 2052 10/16/22 0822 10/17/22 0435  WBC 19.5* 15.1* 7.9  NEUTROABS 16.5* 12.0*  --   HGB 12.9 10.1* 9.5*  HCT  40.4 29.9* 28.3*  MCV 100.2* 95.5 94.6  PLT 393 279 201   Basic Metabolic Panel: Recent Labs  Lab 10/16/22 0603 10/16/22 0821 10/16/22 1337 10/17/22 0157 10/17/22 0435  NA 137 138 135 137 138  K 4.1 4.3 3.9 3.6 3.1*  CL 108 109 105 107 108  CO2 24 23 20* 22 26  GLUCOSE 181* 161* 210* 82 92  BUN 29* 28* 28* 17 15  CREATININE 1.20* 1.13* 1.22* 0.98 0.91  CALCIUM 8.9 8.7* 8.7* 8.6* 8.0*  MG  --   --   --   --  1.7   Liver Function Tests: Recent Labs  Lab 10/15/22 2052  AST 38  ALT 28  ALKPHOS 80  BILITOT 1.7*  PROT 8.4*  ALBUMIN 4.7   CBG: Recent Labs  Lab 10/17/22 0014 10/17/22 0121 10/17/22 0733 10/17/22 0752 10/17/22 0804  GLUCAP 85 75 38* 53* 75    Discharge time spent: greater than 30 minutes.  This record has been created using Conservation officer, historic buildings. Errors have been sought and corrected,but may not always be located. Such creation errors do not reflect on the standard of care.   Signed: Arnetha Courser, MD [facility] 10/17/2022

## 2022-10-17 NOTE — Progress Notes (Signed)
Discharge instructions given to patient. Escorted to entrance to wait for ride. No distress noted.

## 2022-10-17 NOTE — Progress Notes (Signed)
Hypoglycemic Event  CBG: 38  Treatment: 8 oz juice/soda  Symptoms: None  Follow-up CBG: Time:0752 CBG Result:53 Follow-up CBG: Time 0804    CBG Result: 75  Possible Reasons for Event: Inadequate meal intake      Francella Solian

## 2022-10-17 NOTE — ED Notes (Signed)
Pt currently eating another small packet of peanut butter per this RN's request. Pt given another diet gingerale as requested.

## 2022-10-17 NOTE — Plan of Care (Signed)

## 2022-10-17 NOTE — Inpatient Diabetes Management (Signed)
Inpatient Diabetes Program Recommendations  AACE/ADA: New Consensus Statement on Inpatient Glycemic Control (2015)  Target Ranges:  Prepandial:   less than 140 mg/dL      Peak postprandial:   less than 180 mg/dL (1-2 hours)      Critically ill patients:  140 - 180 mg/dL   Lab Results  Component Value Date   GLUCAP 75 10/17/2022   HGBA1C 9.3 (H) 08/31/2022    Latest Reference Range & Units 10/16/22 12:48 10/16/22 17:05 10/16/22 21:51 10/16/22 22:29 10/17/22 00:14 10/17/22 01:21 10/17/22 07:33 10/17/22 07:52 10/17/22 08:04  Glucose-Capillary 70 - 99 mg/dL 528 (H) 413 (H) 43 (LL) 89 85 75 38 (LL) 53 (L) 75  (LL): Data is critically low (H): Data is abnormally high (L): Data is abnormally low  Diabetes history:  DM1 Outpatient Diabetes medications:  Levemir 32 units qd Insulin Carb ratio--1 units:3 carbs Insulin correction factor--1 unit for every 50 > 150 Current orders for Inpatient glycemic control: Levemir 15 units bid, Novolog 4 units tid meal coverage, Novolog 0-15 units tid 0-5 units hs correction  Inpatient Diabetes Program Recommendations:   Patient had hypoglycemia post Novolog correction. -Decrease Novolog correction to 0-6 units tid , 0-5 units hs -Decrease Levemir to 12 units bid -Decrease novolog meal coverage to 2 units tid if eats 50% meal or greater  Thank you, Darel Hong E. Tayten Bergdoll, RN, MSN, CDE  Diabetes Coordinator Inpatient Glycemic Control Team Team Pager [phone] (8am-5pm) 10/17/2022 9:00 AM

## 2022-10-20 LAB — CULTURE, BLOOD (ROUTINE X 2)
Culture: NO GROWTH
Culture: NO GROWTH
Special Requests: ADEQUATE
Special Requests: ADEQUATE

## 2023-08-31 ENCOUNTER — Encounter: Payer: Self-pay | Admitting: Emergency Medicine

## 2023-08-31 ENCOUNTER — Other Ambulatory Visit: Payer: Self-pay

## 2023-08-31 DIAGNOSIS — E860 Dehydration: Secondary | ICD-10-CM | POA: Diagnosis not present

## 2023-08-31 DIAGNOSIS — R112 Nausea with vomiting, unspecified: Secondary | ICD-10-CM | POA: Diagnosis present

## 2023-08-31 DIAGNOSIS — R109 Unspecified abdominal pain: Secondary | ICD-10-CM | POA: Diagnosis not present

## 2023-08-31 DIAGNOSIS — E1069 Type 1 diabetes mellitus with other specified complication: Secondary | ICD-10-CM | POA: Diagnosis not present

## 2023-08-31 LAB — URINALYSIS, ROUTINE W REFLEX MICROSCOPIC
Bilirubin Urine: NEGATIVE
Glucose, UA: NEGATIVE mg/dL
Hgb urine dipstick: NEGATIVE
Ketones, ur: 80 mg/dL — AB
Leukocytes,Ua: NEGATIVE
Nitrite: NEGATIVE
Protein, ur: NEGATIVE mg/dL
Specific Gravity, Urine: 1.015 (ref 1.005–1.030)
pH: 5 (ref 5.0–8.0)

## 2023-08-31 LAB — COMPREHENSIVE METABOLIC PANEL
ALT: 19 U/L (ref 0–44)
AST: 18 U/L (ref 15–41)
Albumin: 4.7 g/dL (ref 3.5–5.0)
Alkaline Phosphatase: 66 U/L (ref 38–126)
Anion gap: 14 (ref 5–15)
BUN: 12 mg/dL (ref 6–20)
CO2: 19 mmol/L — ABNORMAL LOW (ref 22–32)
Calcium: 9.1 mg/dL (ref 8.9–10.3)
Chloride: 102 mmol/L (ref 98–111)
Creatinine, Ser: 1.05 mg/dL — ABNORMAL HIGH (ref 0.44–1.00)
GFR, Estimated: >60 mL/min (ref >60)
Glucose, Bld: 135 mg/dL — ABNORMAL HIGH (ref 70–99)
Potassium: 3.8 mmol/L (ref 3.5–5.1)
Sodium: 135 mmol/L (ref 135–145)
Total Bilirubin: 1.2 mg/dL (ref 0.3–1.2)
Total Protein: 8.2 g/dL — ABNORMAL HIGH (ref 6.5–8.1)

## 2023-08-31 LAB — CBC
HCT: 39.2 % (ref 36.0–46.0)
Hemoglobin: 13.6 g/dL (ref 12.0–15.0)
MCH: 32.4 pg (ref 26.0–34.0)
MCHC: 34.7 g/dL (ref 30.0–36.0)
MCV: 93.3 fL (ref 80.0–100.0)
Platelets: 334 10*3/uL (ref 150–400)
RBC: 4.2 MIL/uL (ref 3.87–5.11)
RDW: 11.3 % — ABNORMAL LOW (ref 11.5–15.5)
WBC: 10 10*3/uL (ref 4.0–10.5)
nRBC: 0 % (ref 0.0–0.2)

## 2023-08-31 LAB — LIPASE, BLOOD: Lipase: 26 U/L (ref 11–51)

## 2023-08-31 NOTE — ED Notes (Signed)
Negative POC pregnancy

## 2023-08-31 NOTE — ED Triage Notes (Signed)
Pt reports she is a type I diabetic and has had n/v/d x 3 weeks.  Has an insulin pump but reports some spikes in her blood sugar even though she is not eating well.  No fever/chills.  No shob.

## 2023-09-01 ENCOUNTER — Emergency Department: Payer: Medicaid Other

## 2023-09-01 ENCOUNTER — Emergency Department
Admission: EM | Admit: 2023-09-01 | Discharge: 2023-09-01 | Disposition: A | Discharge status: Home / Self Care | Payer: Medicaid Other | Attending: Emergency Medicine | Admitting: Emergency Medicine

## 2023-09-01 DIAGNOSIS — E1069 Type 1 diabetes mellitus with other specified complication: Secondary | ICD-10-CM

## 2023-09-01 DIAGNOSIS — R112 Nausea with vomiting, unspecified: Secondary | ICD-10-CM

## 2023-09-01 DIAGNOSIS — E860 Dehydration: Secondary | ICD-10-CM

## 2023-09-01 LAB — HCG, QUANTITATIVE, PREGNANCY: hCG, Beta Chain, Quant, S: 1 m[IU]/mL (ref <5)

## 2023-09-01 MED ORDER — IOHEXOL 300 MG/ML  SOLN
100.0000 mL | Freq: Once | INTRAMUSCULAR | Status: AC | PRN
  Administered 2023-09-01: 100 mL via INTRAVENOUS

## 2023-09-01 MED ORDER — LACTATED RINGERS IV BOLUS
1000.0000 mL | Freq: Once | INTRAVENOUS | Status: AC
  Administered 2023-09-01: 1000 mL via INTRAVENOUS

## 2023-09-01 MED ORDER — ONDANSETRON 4 MG PO TBDP: ORAL_TABLET | ORAL | 0 refills | Status: AC

## 2023-09-01 MED ORDER — ONDANSETRON HCL 4 MG/2ML IJ SOLN
4.0000 mg | INTRAMUSCULAR | Status: AC
  Administered 2023-09-01: 4 mg via INTRAVENOUS
  Filled 2023-09-01: qty 2

## 2023-09-01 NOTE — ED Notes (Signed)
Pt given Gatorade and instructed to inform RN of nausea or vomiting for PO challenge.

## 2023-09-01 NOTE — ED Notes (Signed)
Pt is tolerating PO challenge at this time.

## 2023-09-01 NOTE — ED Provider Notes (Signed)
[facility] Provider Note    Event Date/Time   First MD Initiated Contact with Patient 09/01/23 [phone]     (approximate)   History   Abdominal Pain   HPI [name] is a 27 y.o. female with a history of type 1 diabetes who presents for evaluation of 3 weeks of nausea, vomiting, diarrhea, and generalized abdominal pain.  Decreased oral intake for that whole period of time.  She saw her primary care doctor about 4 days ago and he is referring her to a GI specialist.  She has not had anything to help with her nausea and vomiting.  She said that because she has not been eating very much she has actually not had a bowel movement for a couple of days but was having diarrhea prior to this.  She denies fever, chest pain, shortness of breath.  She said that in general she just feels tired and weak.     Physical Exam   Triage Vital Signs: ED Triage Vitals [08/31/23 2305]  Encounter Vitals Group     BP (!) 155/116     Systolic BP Percentile      Diastolic BP Percentile      Pulse Rate (!) 115     Resp 20     Temp 98.7 F (37.1 C)     Temp Source Oral     SpO2 100 %     Weight      Height      Head Circumference      Peak Flow      Pain Score 7     Pain Loc      Pain Education      Exclude from Growth Chart     Most recent vital signs: Vitals:   09/01/23 0615 09/01/23 0630  BP:  (!) 119/93  Pulse: 98 90  Resp: 16 14  Temp:    SpO2: 100% 99%    General: Awake, no obvious distress and not ill-appearing. CV:  Good peripheral perfusion.  Tachycardia, regular rhythm. Resp:  Normal effort. Speaking easily and comfortably, no accessory muscle usage nor intercostal retractions.  Lungs clear to auscultation. Abd:  No distention.  Tenderness palpation throughout the abdomen without localized peritonitis.  No rebound, some guarding.   ED Results / Procedures / Treatments   Labs (all labs ordered are listed, but only abnormal results are displayed) Labs  Reviewed  COMPREHENSIVE METABOLIC PANEL - Abnormal; Notable for the following components:      Result Value   CO2 19 (*)    Glucose, Bld 135 (*)    Creatinine, Ser 1.05 (*)    Total Protein 8.2 (*)    All other components within normal limits  CBC - Abnormal; Notable for the following components:   RDW 11.3 (*)    All other components within normal limits  URINALYSIS, ROUTINE W REFLEX MICROSCOPIC - Abnormal; Notable for the following components:   Color, Urine YELLOW (*)    APPearance HAZY (*)    Ketones, ur 80 (*)    All other components within normal limits  LIPASE, BLOOD  HCG, QUANTITATIVE, PREGNANCY  POC URINE PREG, ED     RADIOLOGY I viewed and interpreted the patient's CT of the abdomen and pelvis.  See hospital course for details   PROCEDURES:  Critical Care performed: No  .1-3 Lead EKG Interpretation  Performed by: Loleta Rose, MD Authorized by: Loleta Rose, MD     Interpretation: abnormal  ECG rate:  115   ECG rate assessment: tachycardic     Rhythm: sinus tachycardia     Ectopy: none     Conduction: normal       IMPRESSION / MDM / ASSESSMENT AND PLAN / ED COURSE  I reviewed the triage vital signs and the nursing notes.                              Differential diagnosis includes, but is not limited to, DKA, HHS, dehydration, renal failure, UTI, inflammatory bowel disease.   Patient's presentation is most consistent with acute presentation with potential threat to life or bodily function.  Labs/studies ordered: CMP, hCG, lipase, CBC, urinalysis, CT abdomen/pelvis  Interventions/Medications given:  Medications  lactated ringers bolus 1,000 mL (1,000 mLs Intravenous New Bag/Given 09/01/23 0453)  ondansetron (ZOFRAN) injection 4 mg (4 mg Intravenous Given 09/01/23 0454)  iohexol (OMNIPAQUE) 300 MG/ML solution 100 mL (100 mLs Intravenous Contrast Given 09/01/23 0512)    (Note:  hospital course my include additional interventions and/or  labs/studies not listed above.)   Despite the patient's history of present illness, she is surprisingly well from a lab work perspective with only very slight bump in her creatinine to 1.05, normal anion gap, normal electrolytes, normal CBC, and 80 ketones on her urine as being the main indicator of her volume depletion.  I ordered LR 1 L IV bolus as well as Zofran 4 mg IV.  Given the tenderness to palpation of the abdomen and the ongoing symptoms for about 3 weeks, I ordered a CT of the abdomen pelvis for further evaluation.  The patient is on the cardiac monitor to evaluate for evidence of arrhythmia and/or significant heart rate changes.   Clinical Course as of 09/01/23 6045  Sat Sep 01, 2023  0607 CT ABDOMEN PELVIS W CONTRAST I viewed and interpreted the patient's CT of the abdomen and pelvis.  No evidence of diverticulitis, appendicitis, or other acute or emergent finding.  Radiologist report concurs, no evidence of acute abnormality.  I will update the patient and plan for discharge and outpatient follow-up after the completion of her IV fluids if she is able to tolerate oral intake. [CF]  [phone] Patient is tolerating oral intake without difficulty.  Heart rate has resolved down to 90.  I initially considered hospitalization given the duration of her symptoms but at this point she should be appropriate for discharge and outpatient follow-up.  Patient agreed.  I gave my usual and customary follow-up recommendations and return precautions and provided Rx for antiemetics [CF]    Clinical Course User Index [CF] Loleta Rose, MD     FINAL CLINICAL IMPRESSION(S) / ED DIAGNOSES   Final diagnoses:  Nausea and vomiting, unspecified vomiting type  Type 1 diabetes mellitus with other specified complication (HCC)  Dehydration     Rx / DC Orders   ED Discharge Orders          Ordered    ondansetron (ZOFRAN-ODT) 4 MG disintegrating tablet        09/01/23 0641             Note:   This document was prepared using Dragon voice recognition software and may include unintentional dictation errors.   Loleta Rose, MD 09/01/23 [phone]

## 2023-09-01 NOTE — Discharge Instructions (Addendum)
Your workup in the Emergency Department today was reassuring.  We did not find any specific abnormalities.  We recommend you drink plenty of fluids, take your regular medications and/or any new ones prescribed today, and follow up with the doctor(s) listed in these documents as recommended.  Return to the Emergency Department if you develop new or worsening symptoms that concern you.  

## 2024-02-18 ENCOUNTER — Ambulatory Visit

## 2024-02-18 DIAGNOSIS — K640 First degree hemorrhoids: Secondary | ICD-10-CM | POA: Diagnosis not present

## 2024-02-18 DIAGNOSIS — K591 Functional diarrhea: Secondary | ICD-10-CM | POA: Diagnosis not present

## 2024-02-18 DIAGNOSIS — K2950 Unspecified chronic gastritis without bleeding: Secondary | ICD-10-CM | POA: Diagnosis not present

## 2024-02-18 DIAGNOSIS — K2100 Gastro-esophageal reflux disease with esophagitis, without bleeding: Secondary | ICD-10-CM | POA: Diagnosis not present
